# Patient Record
Sex: Male | Born: 1945 | Race: Black or African American | Hispanic: No | Marital: Married | Smoking: Former smoker
Health system: Southern US, Community
[De-identification: ages and names within clinical notes are randomized; demographics above are authoritative.]

## PROBLEM LIST (undated history)

## (undated) DIAGNOSIS — C801 Malignant (primary) neoplasm, unspecified: Secondary | ICD-10-CM

## (undated) DIAGNOSIS — I10 Essential (primary) hypertension: Secondary | ICD-10-CM

## (undated) DIAGNOSIS — B1920 Unspecified viral hepatitis C without hepatic coma: Secondary | ICD-10-CM

---

## 1999-02-03 ENCOUNTER — Encounter: Admission: RE | Admit: 1999-02-03 | Discharge: 1999-02-06 | Payer: Self-pay | Admitting: Family Medicine

## 2001-01-17 ENCOUNTER — Encounter: Payer: Self-pay | Admitting: Gastroenterology

## 2001-01-17 ENCOUNTER — Encounter: Admission: RE | Admit: 2001-01-17 | Discharge: 2001-01-17 | Payer: Self-pay | Admitting: Gastroenterology

## 2006-07-16 ENCOUNTER — Emergency Department (HOSPITAL_COMMUNITY): Admission: EM | Admit: 2006-07-16 | Discharge: 2006-07-16 | Payer: Self-pay | Admitting: Emergency Medicine

## 2006-08-30 ENCOUNTER — Inpatient Hospital Stay (HOSPITAL_COMMUNITY): Admission: RE | Admit: 2006-08-30 | Discharge: 2006-08-31 | Payer: Self-pay | Admitting: Neurosurgery

## 2015-04-17 ENCOUNTER — Other Ambulatory Visit (HOSPITAL_COMMUNITY): Payer: Self-pay | Admitting: Nurse Practitioner

## 2015-04-17 DIAGNOSIS — B182 Chronic viral hepatitis C: Secondary | ICD-10-CM

## 2015-05-07 ENCOUNTER — Ambulatory Visit (HOSPITAL_COMMUNITY)
Admission: RE | Admit: 2015-05-07 | Discharge: 2015-05-07 | Disposition: A | Discharge status: Home / Self Care | Payer: BLUE CROSS/BLUE SHIELD | Source: Ambulatory Visit | Attending: Nurse Practitioner | Admitting: Nurse Practitioner

## 2015-05-07 DIAGNOSIS — N281 Cyst of kidney, acquired: Secondary | ICD-10-CM | POA: Diagnosis not present

## 2015-05-07 DIAGNOSIS — I10 Essential (primary) hypertension: Secondary | ICD-10-CM | POA: Insufficient documentation

## 2015-05-07 DIAGNOSIS — B182 Chronic viral hepatitis C: Secondary | ICD-10-CM | POA: Diagnosis present

## 2015-05-07 DIAGNOSIS — K7689 Other specified diseases of liver: Secondary | ICD-10-CM | POA: Insufficient documentation

## 2016-03-26 ENCOUNTER — Other Ambulatory Visit: Payer: Self-pay | Admitting: Nurse Practitioner

## 2016-03-26 DIAGNOSIS — K7469 Other cirrhosis of liver: Secondary | ICD-10-CM

## 2016-04-02 ENCOUNTER — Ambulatory Visit
Admission: RE | Admit: 2016-04-02 | Discharge: 2016-04-02 | Disposition: A | Discharge status: Home / Self Care | Payer: BLUE CROSS/BLUE SHIELD | Source: Ambulatory Visit | Attending: Nurse Practitioner | Admitting: Nurse Practitioner

## 2016-04-02 DIAGNOSIS — K7469 Other cirrhosis of liver: Secondary | ICD-10-CM

## 2016-09-15 ENCOUNTER — Other Ambulatory Visit: Payer: Self-pay | Admitting: Nurse Practitioner

## 2016-09-15 DIAGNOSIS — K7469 Other cirrhosis of liver: Secondary | ICD-10-CM

## 2016-09-23 ENCOUNTER — Ambulatory Visit
Admission: RE | Admit: 2016-09-23 | Discharge: 2016-09-23 | Disposition: A | Discharge status: Home / Self Care | Payer: BLUE CROSS/BLUE SHIELD | Source: Ambulatory Visit | Attending: Nurse Practitioner | Admitting: Nurse Practitioner

## 2016-09-23 DIAGNOSIS — K7469 Other cirrhosis of liver: Secondary | ICD-10-CM

## 2017-03-15 DIAGNOSIS — K7469 Other cirrhosis of liver: Secondary | ICD-10-CM | POA: Diagnosis not present

## 2017-03-15 DIAGNOSIS — K740 Hepatic fibrosis: Secondary | ICD-10-CM | POA: Diagnosis not present

## 2017-03-16 ENCOUNTER — Other Ambulatory Visit: Payer: Self-pay | Admitting: Nurse Practitioner

## 2017-03-16 DIAGNOSIS — K7469 Other cirrhosis of liver: Secondary | ICD-10-CM

## 2017-04-07 ENCOUNTER — Ambulatory Visit
Admission: RE | Admit: 2017-04-07 | Discharge: 2017-04-07 | Disposition: A | Discharge status: Home / Self Care | Payer: BLUE CROSS/BLUE SHIELD | Source: Ambulatory Visit | Attending: Nurse Practitioner | Admitting: Nurse Practitioner

## 2017-04-07 DIAGNOSIS — K7460 Unspecified cirrhosis of liver: Secondary | ICD-10-CM | POA: Diagnosis not present

## 2017-04-07 DIAGNOSIS — K7469 Other cirrhosis of liver: Secondary | ICD-10-CM

## 2017-05-06 DIAGNOSIS — H401112 Primary open-angle glaucoma, right eye, moderate stage: Secondary | ICD-10-CM | POA: Diagnosis not present

## 2017-05-19 DIAGNOSIS — I10 Essential (primary) hypertension: Secondary | ICD-10-CM | POA: Diagnosis not present

## 2017-05-19 DIAGNOSIS — Z0000 Encounter for general adult medical examination without abnormal findings: Secondary | ICD-10-CM | POA: Diagnosis not present

## 2017-05-19 DIAGNOSIS — K5900 Constipation, unspecified: Secondary | ICD-10-CM | POA: Diagnosis not present

## 2017-05-19 DIAGNOSIS — Z23 Encounter for immunization: Secondary | ICD-10-CM | POA: Diagnosis not present

## 2017-05-19 DIAGNOSIS — N529 Male erectile dysfunction, unspecified: Secondary | ICD-10-CM | POA: Diagnosis not present

## 2017-05-19 DIAGNOSIS — Z1389 Encounter for screening for other disorder: Secondary | ICD-10-CM | POA: Diagnosis not present

## 2017-05-23 ENCOUNTER — Other Ambulatory Visit: Payer: Self-pay | Admitting: Family Medicine

## 2017-05-23 DIAGNOSIS — Z136 Encounter for screening for cardiovascular disorders: Secondary | ICD-10-CM

## 2017-06-10 ENCOUNTER — Ambulatory Visit: Payer: BLUE CROSS/BLUE SHIELD

## 2017-07-06 ENCOUNTER — Ambulatory Visit
Admission: RE | Admit: 2017-07-06 | Discharge: 2017-07-06 | Disposition: A | Discharge status: Home / Self Care | Payer: BLUE CROSS/BLUE SHIELD | Source: Ambulatory Visit | Attending: Family Medicine | Admitting: Family Medicine

## 2017-07-06 DIAGNOSIS — Z87891 Personal history of nicotine dependence: Secondary | ICD-10-CM | POA: Diagnosis not present

## 2017-07-06 DIAGNOSIS — Z136 Encounter for screening for cardiovascular disorders: Secondary | ICD-10-CM

## 2017-08-23 ENCOUNTER — Other Ambulatory Visit: Payer: Self-pay | Admitting: Nurse Practitioner

## 2017-08-23 DIAGNOSIS — K7469 Other cirrhosis of liver: Secondary | ICD-10-CM

## 2017-08-23 DIAGNOSIS — K740 Hepatic fibrosis: Secondary | ICD-10-CM | POA: Diagnosis not present

## 2017-09-08 ENCOUNTER — Ambulatory Visit
Admission: RE | Admit: 2017-09-08 | Discharge: 2017-09-08 | Disposition: A | Discharge status: Home / Self Care | Payer: BLUE CROSS/BLUE SHIELD | Source: Ambulatory Visit | Attending: Nurse Practitioner | Admitting: Nurse Practitioner

## 2017-09-08 DIAGNOSIS — K7460 Unspecified cirrhosis of liver: Secondary | ICD-10-CM | POA: Diagnosis not present

## 2017-09-08 DIAGNOSIS — K7469 Other cirrhosis of liver: Secondary | ICD-10-CM

## 2017-12-12 DIAGNOSIS — H401112 Primary open-angle glaucoma, right eye, moderate stage: Secondary | ICD-10-CM | POA: Diagnosis not present

## 2018-03-15 DIAGNOSIS — M79671 Pain in right foot: Secondary | ICD-10-CM | POA: Diagnosis not present

## 2018-03-15 DIAGNOSIS — M25774 Osteophyte, right foot: Secondary | ICD-10-CM | POA: Diagnosis not present

## 2018-03-15 DIAGNOSIS — L03031 Cellulitis of right toe: Secondary | ICD-10-CM | POA: Diagnosis not present

## 2018-03-22 DIAGNOSIS — K7469 Other cirrhosis of liver: Secondary | ICD-10-CM | POA: Diagnosis not present

## 2018-03-22 DIAGNOSIS — K740 Hepatic fibrosis: Secondary | ICD-10-CM | POA: Diagnosis not present

## 2018-03-27 ENCOUNTER — Other Ambulatory Visit: Payer: Self-pay | Admitting: Nurse Practitioner

## 2018-03-27 DIAGNOSIS — K7469 Other cirrhosis of liver: Secondary | ICD-10-CM

## 2018-03-29 DIAGNOSIS — M79675 Pain in left toe(s): Secondary | ICD-10-CM | POA: Diagnosis not present

## 2018-03-29 DIAGNOSIS — M79674 Pain in right toe(s): Secondary | ICD-10-CM | POA: Diagnosis not present

## 2018-04-05 ENCOUNTER — Ambulatory Visit
Admission: RE | Admit: 2018-04-05 | Discharge: 2018-04-05 | Disposition: A | Discharge status: Home / Self Care | Payer: BLUE CROSS/BLUE SHIELD | Source: Ambulatory Visit | Attending: Nurse Practitioner | Admitting: Nurse Practitioner

## 2018-04-05 DIAGNOSIS — K743 Primary biliary cirrhosis: Secondary | ICD-10-CM | POA: Diagnosis not present

## 2018-04-05 DIAGNOSIS — K7469 Other cirrhosis of liver: Secondary | ICD-10-CM

## 2018-04-13 ENCOUNTER — Other Ambulatory Visit: Payer: Self-pay | Admitting: Nurse Practitioner

## 2018-04-13 DIAGNOSIS — K7469 Other cirrhosis of liver: Secondary | ICD-10-CM

## 2018-04-13 DIAGNOSIS — K769 Liver disease, unspecified: Secondary | ICD-10-CM

## 2018-04-26 ENCOUNTER — Ambulatory Visit
Admission: RE | Admit: 2018-04-26 | Discharge: 2018-04-26 | Disposition: A | Discharge status: Home / Self Care | Payer: BLUE CROSS/BLUE SHIELD | Source: Ambulatory Visit | Attending: Nurse Practitioner | Admitting: Nurse Practitioner

## 2018-04-26 DIAGNOSIS — K7689 Other specified diseases of liver: Secondary | ICD-10-CM | POA: Diagnosis not present

## 2018-04-26 DIAGNOSIS — K769 Liver disease, unspecified: Secondary | ICD-10-CM

## 2018-04-26 DIAGNOSIS — K7469 Other cirrhosis of liver: Secondary | ICD-10-CM

## 2018-04-26 MED ORDER — GADOXETATE DISODIUM 0.25 MMOL/ML IV SOLN
9.0000 mL | Freq: Once | INTRAVENOUS | Status: AC | PRN
  Administered 2018-04-26: 9 mL via INTRAVENOUS

## 2018-05-03 DIAGNOSIS — Z23 Encounter for immunization: Secondary | ICD-10-CM | POA: Diagnosis not present

## 2018-05-09 DIAGNOSIS — H401112 Primary open-angle glaucoma, right eye, moderate stage: Secondary | ICD-10-CM | POA: Diagnosis not present

## 2018-05-25 DIAGNOSIS — N529 Male erectile dysfunction, unspecified: Secondary | ICD-10-CM | POA: Diagnosis not present

## 2018-05-25 DIAGNOSIS — Z0000 Encounter for general adult medical examination without abnormal findings: Secondary | ICD-10-CM | POA: Diagnosis not present

## 2018-05-25 DIAGNOSIS — I10 Essential (primary) hypertension: Secondary | ICD-10-CM | POA: Diagnosis not present

## 2018-05-25 DIAGNOSIS — K5900 Constipation, unspecified: Secondary | ICD-10-CM | POA: Diagnosis not present

## 2018-05-25 DIAGNOSIS — Z125 Encounter for screening for malignant neoplasm of prostate: Secondary | ICD-10-CM | POA: Diagnosis not present

## 2018-05-25 DIAGNOSIS — Z1389 Encounter for screening for other disorder: Secondary | ICD-10-CM | POA: Diagnosis not present

## 2018-07-15 IMAGING — US US ABDOMEN LIMITED
1 series · 14 of 25 positions shown · non-contrast
Comparison: Ultrasound 09/23/2016 .

CLINICAL DATA: Cirrhosis.

EXAM:
ULTRASOUND ABDOMEN LIMITED RIGHT UPPER QUADRANT

[Series 1: us abdomen limited · 0.20mm/px · 14 of 47 slices shown]
[im 1/47]
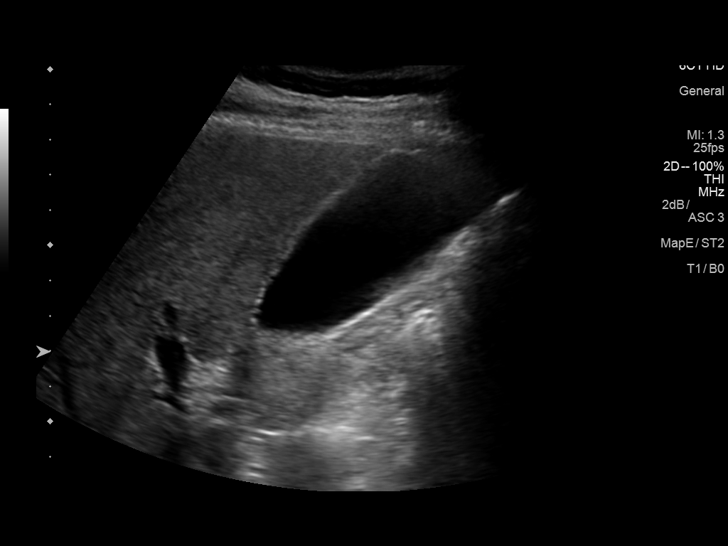
[im 4/47]
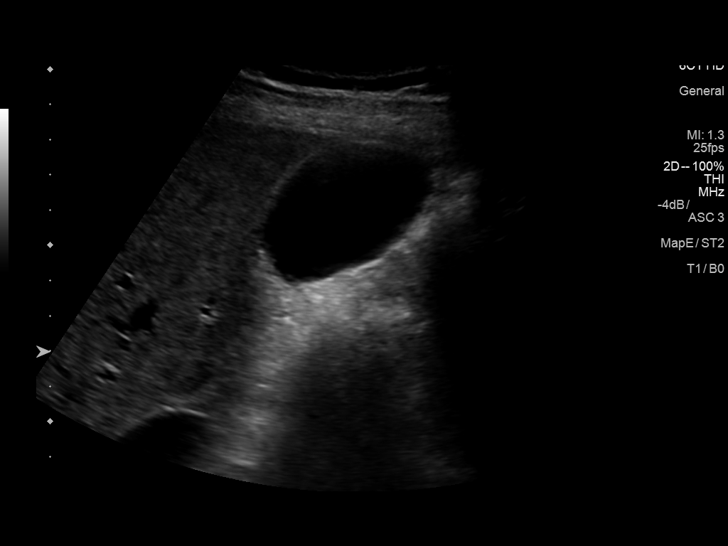
[im 8/47]
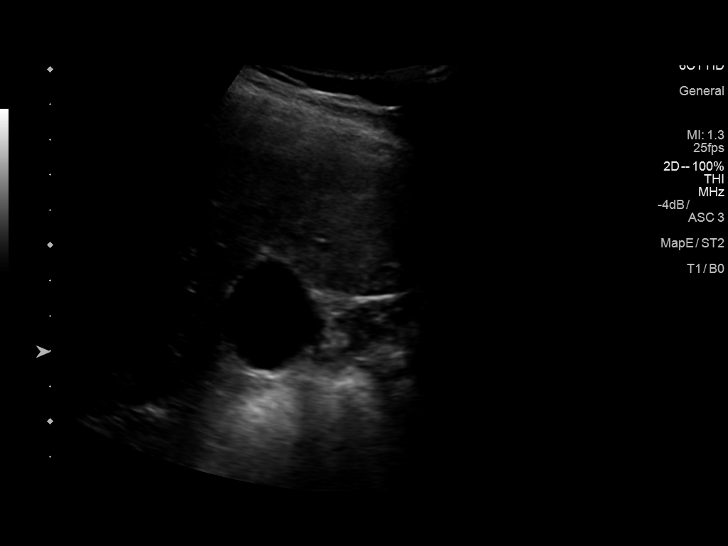
[im 12/47]
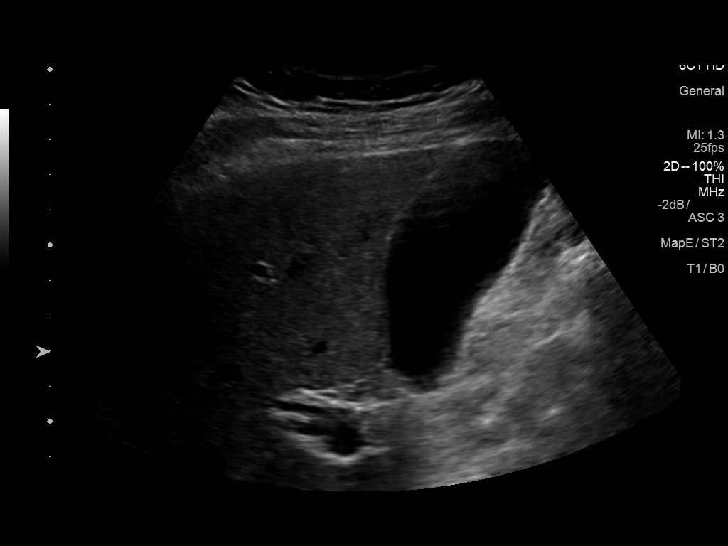
[im 16/47]
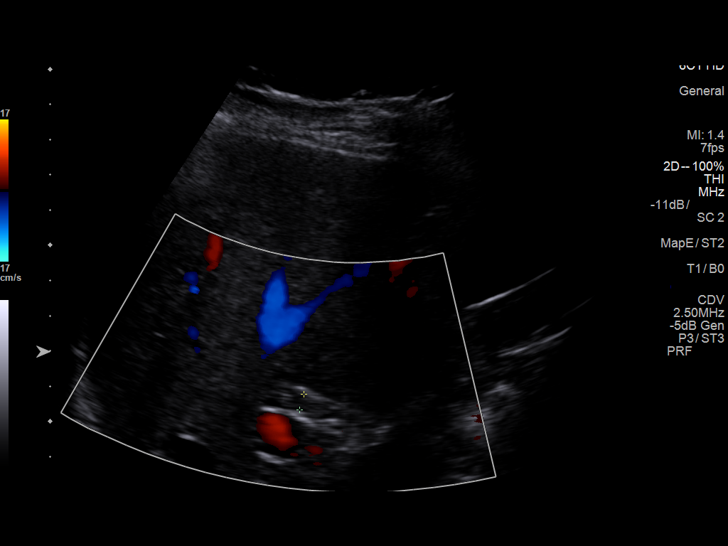
[im 18/47]
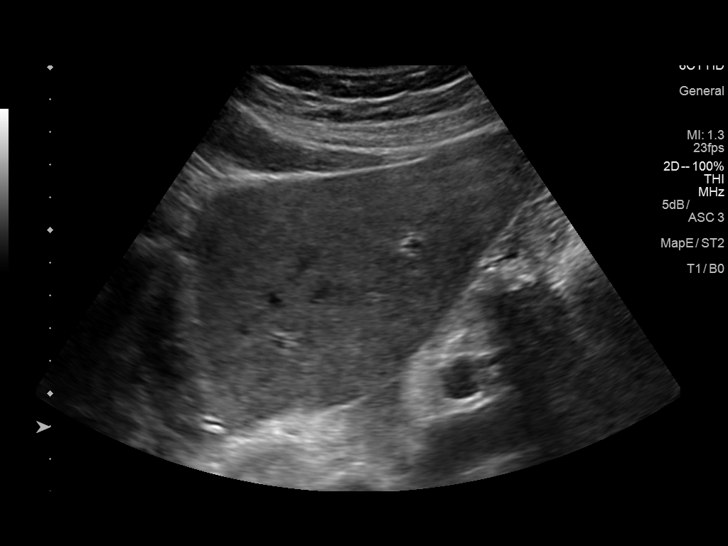
[im 22/47]
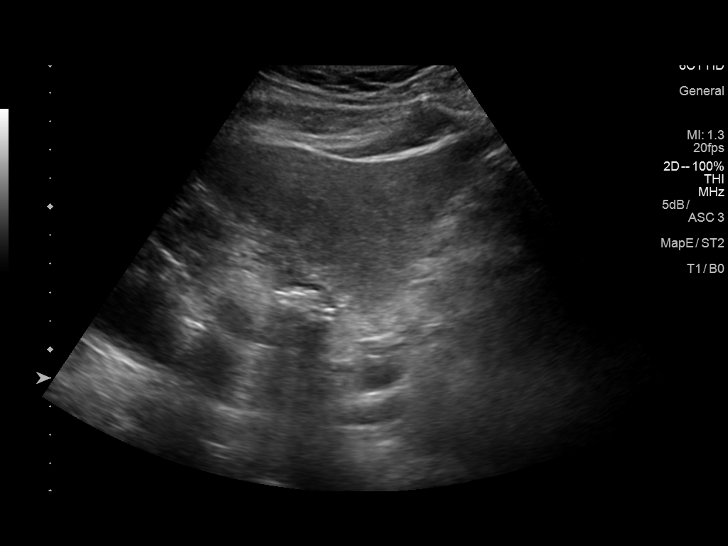
[im 25/47]
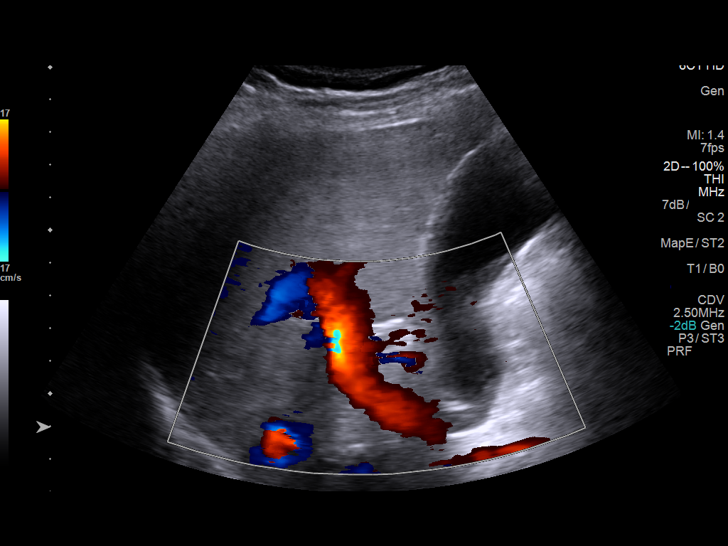
[im 29/47]
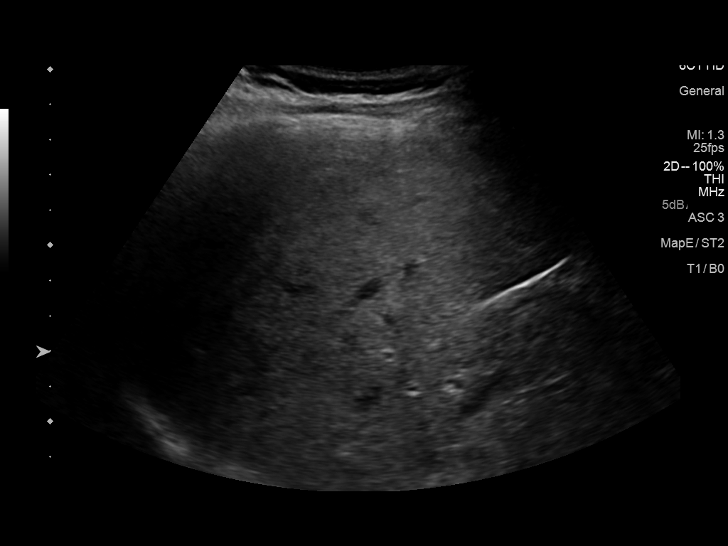
[im 31/47]
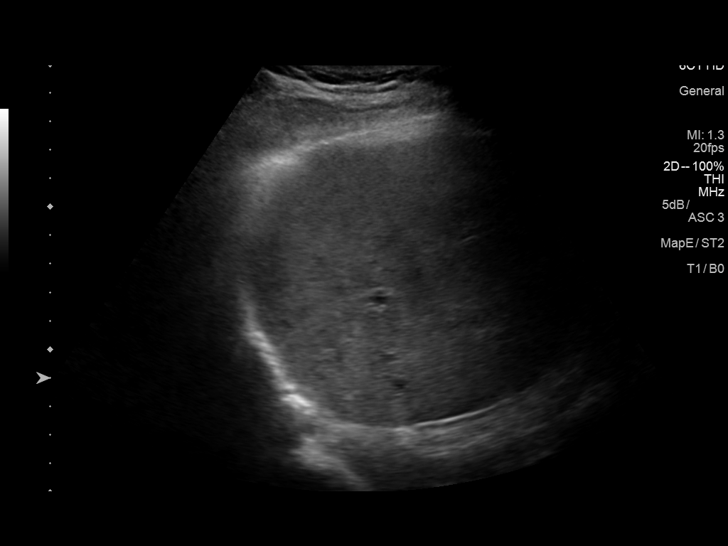
[im 35/47]
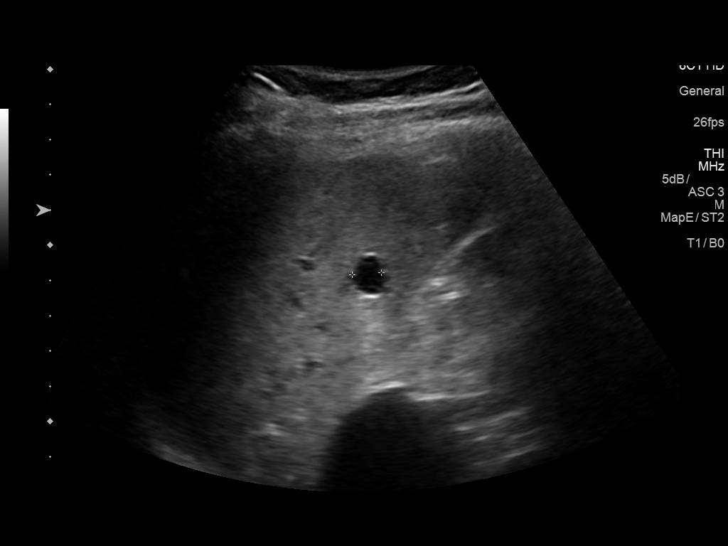
[im 39/47]
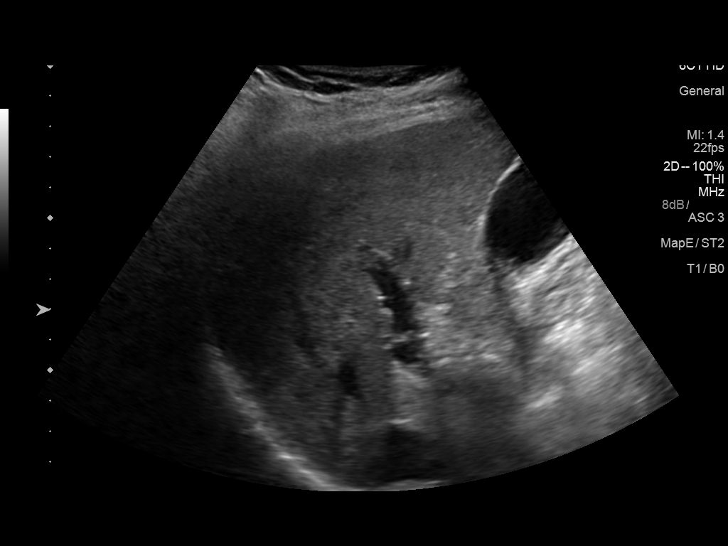
[im 43/47]
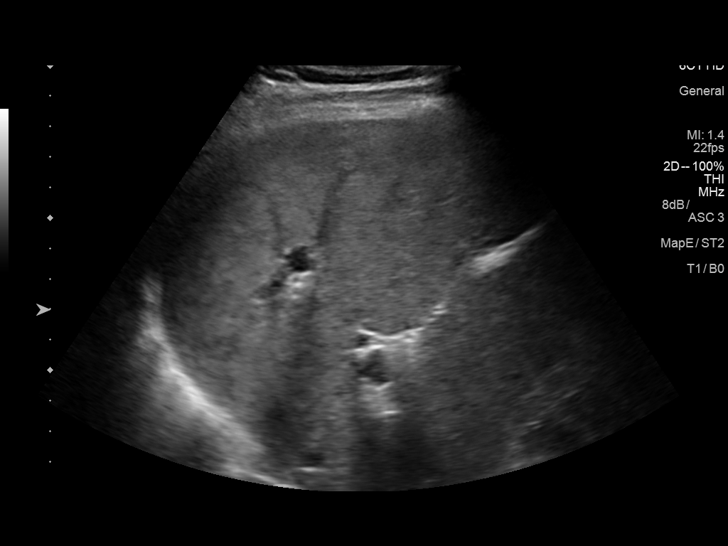
[im 47/47]
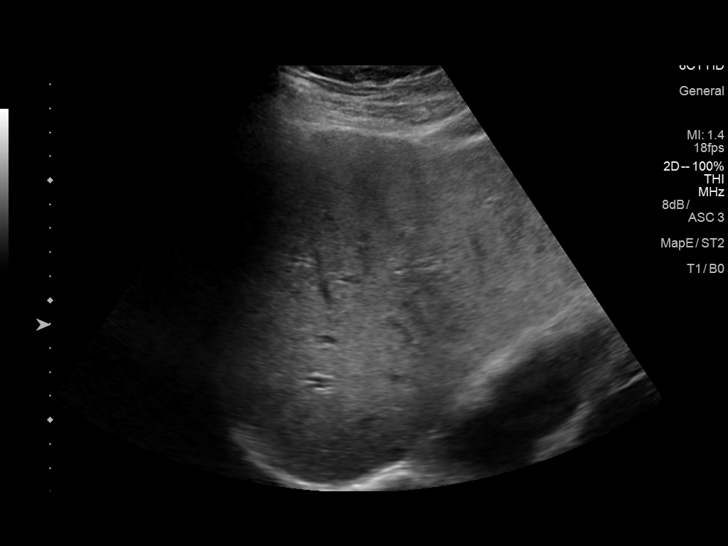

[14 of 25 positions shown; findings below may reference images not displayed]

FINDINGS: Gallbladder:

No gallstones or wall thickening visualized. No sonographic Murphy
sign noted by sonographer.

Common bile duct:

Diameter: 4.6 mm

Liver:

1.5 cm and 1.2 cm cysts right hepatic lobe. Slight hepatic contour
irregularity consistent patient's known cirrhosis. These may be
minimally septated and are consistent with benign cysts. Portal vein
is patent on color Doppler imaging with normal direction of blood
flow towards the liver.
IMPRESSION: 1. No gallstones or biliary distention.

2. Increased hepatic echogenicity consistent fatty infiltration
and/or hepatocellular disease. Slight hepatic contour irregularity
consistent the patient's known cirrhosis . Small benign cyst cysts
right hepatic lobe . No focal solid hepatic lesions noted .

## 2018-09-20 DIAGNOSIS — K740 Hepatic fibrosis: Secondary | ICD-10-CM | POA: Diagnosis not present

## 2018-09-21 ENCOUNTER — Other Ambulatory Visit: Payer: Self-pay | Admitting: Nurse Practitioner

## 2018-09-21 DIAGNOSIS — K7469 Other cirrhosis of liver: Secondary | ICD-10-CM

## 2018-10-25 ENCOUNTER — Other Ambulatory Visit: Payer: BLUE CROSS/BLUE SHIELD

## 2018-11-29 ENCOUNTER — Other Ambulatory Visit: Payer: BLUE CROSS/BLUE SHIELD

## 2018-12-06 ENCOUNTER — Ambulatory Visit
Admission: RE | Admit: 2018-12-06 | Discharge: 2018-12-06 | Disposition: A | Discharge status: Home / Self Care | Payer: BLUE CROSS/BLUE SHIELD | Source: Ambulatory Visit | Attending: Nurse Practitioner | Admitting: Nurse Practitioner

## 2018-12-06 DIAGNOSIS — K7469 Other cirrhosis of liver: Secondary | ICD-10-CM

## 2019-03-28 DIAGNOSIS — K740 Hepatic fibrosis: Secondary | ICD-10-CM | POA: Diagnosis not present

## 2019-04-02 ENCOUNTER — Other Ambulatory Visit: Payer: Self-pay | Admitting: Nurse Practitioner

## 2019-04-03 ENCOUNTER — Other Ambulatory Visit: Payer: Self-pay | Admitting: Nurse Practitioner

## 2019-04-03 DIAGNOSIS — K7469 Other cirrhosis of liver: Secondary | ICD-10-CM

## 2019-04-11 ENCOUNTER — Other Ambulatory Visit: Payer: BLUE CROSS/BLUE SHIELD

## 2019-04-17 DIAGNOSIS — K7469 Other cirrhosis of liver: Secondary | ICD-10-CM | POA: Diagnosis not present

## 2019-04-18 ENCOUNTER — Ambulatory Visit
Admission: RE | Admit: 2019-04-18 | Discharge: 2019-04-18 | Disposition: A | Discharge status: Home / Self Care | Payer: BC Managed Care – PPO | Source: Ambulatory Visit | Attending: Nurse Practitioner | Admitting: Nurse Practitioner

## 2019-04-18 DIAGNOSIS — K7469 Other cirrhosis of liver: Secondary | ICD-10-CM

## 2019-04-18 DIAGNOSIS — K7460 Unspecified cirrhosis of liver: Secondary | ICD-10-CM | POA: Diagnosis not present

## 2019-05-17 DIAGNOSIS — Z23 Encounter for immunization: Secondary | ICD-10-CM | POA: Diagnosis not present

## 2019-06-04 DIAGNOSIS — H401112 Primary open-angle glaucoma, right eye, moderate stage: Secondary | ICD-10-CM | POA: Diagnosis not present

## 2019-06-06 DIAGNOSIS — Z1389 Encounter for screening for other disorder: Secondary | ICD-10-CM | POA: Diagnosis not present

## 2019-06-06 DIAGNOSIS — Z125 Encounter for screening for malignant neoplasm of prostate: Secondary | ICD-10-CM | POA: Diagnosis not present

## 2019-06-06 DIAGNOSIS — I10 Essential (primary) hypertension: Secondary | ICD-10-CM | POA: Diagnosis not present

## 2019-06-06 DIAGNOSIS — Z13220 Encounter for screening for lipoid disorders: Secondary | ICD-10-CM | POA: Diagnosis not present

## 2019-06-06 DIAGNOSIS — Z0000 Encounter for general adult medical examination without abnormal findings: Secondary | ICD-10-CM | POA: Diagnosis not present

## 2019-06-20 DIAGNOSIS — H401123 Primary open-angle glaucoma, left eye, severe stage: Secondary | ICD-10-CM | POA: Diagnosis not present

## 2019-07-13 IMAGING — US US ABDOMEN LIMITED
1 series · 13 of 25 positions shown · non-contrast
Comparison: Previous studies 04/07/2017 and 09/08/2017.

CLINICAL DATA: [place] cirrhosis.

EXAM:
ULTRASOUND ABDOMEN LIMITED RIGHT UPPER QUADRANT

[Series 1: us abdomen limited · 0.20mm/px · 13 of 58 slices shown]
[im 1/58]
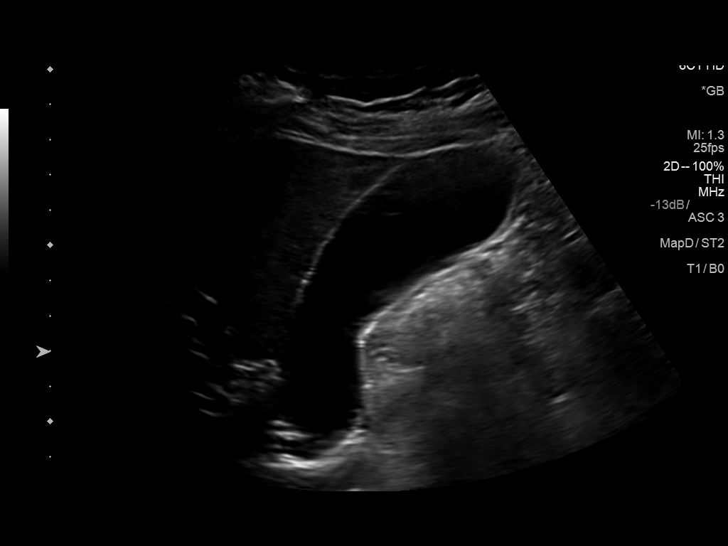
[im 5/58]
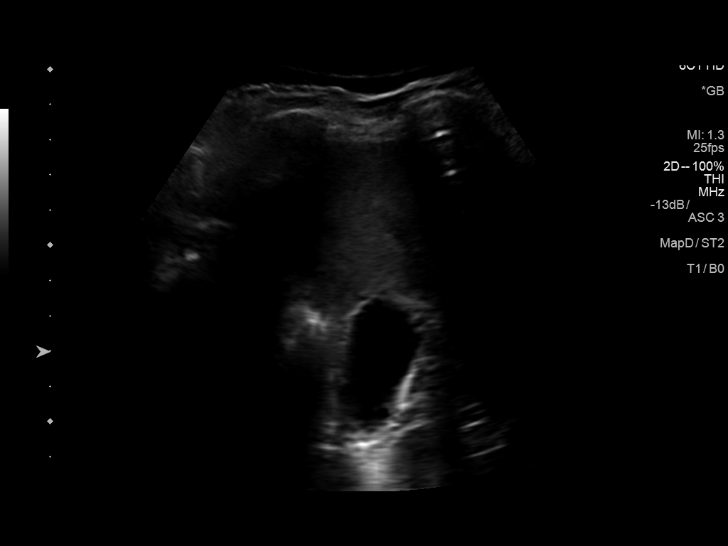
[im 10/58]
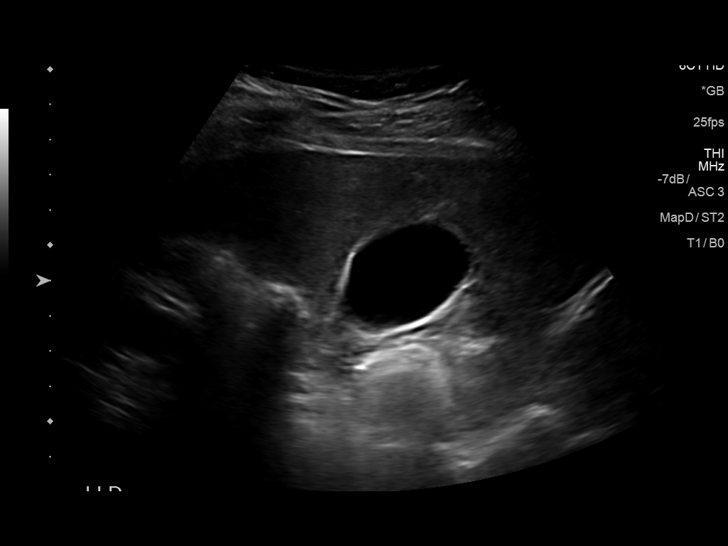
[im 15/58]
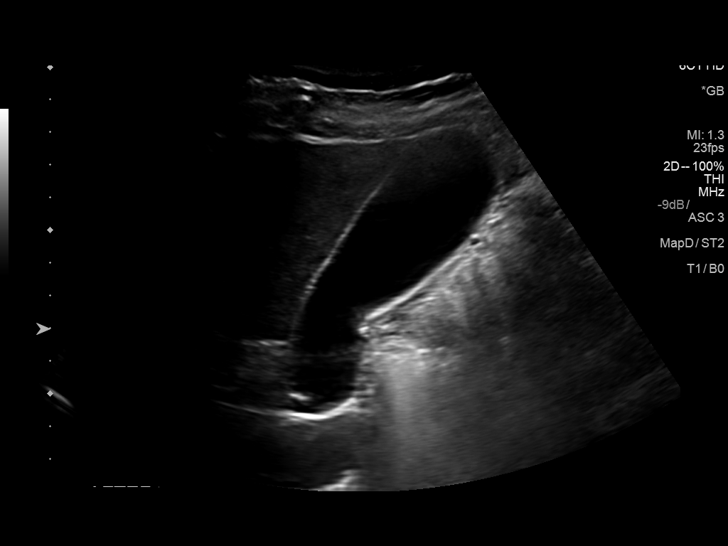
[im 20/58]
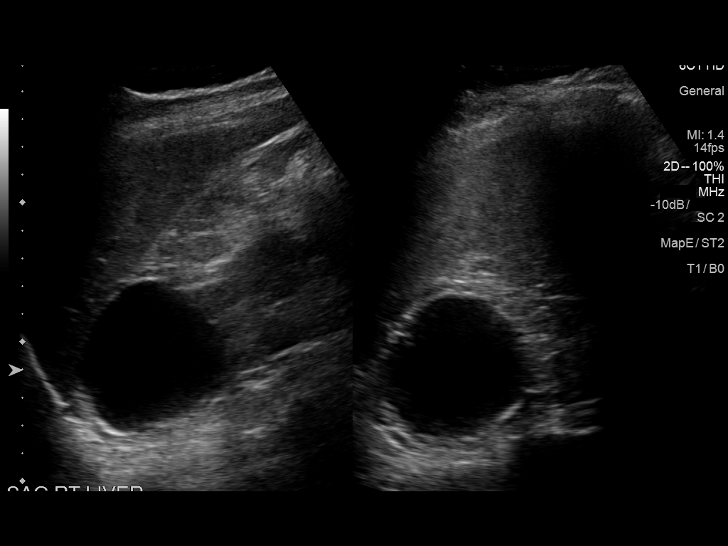
[im 24/58]
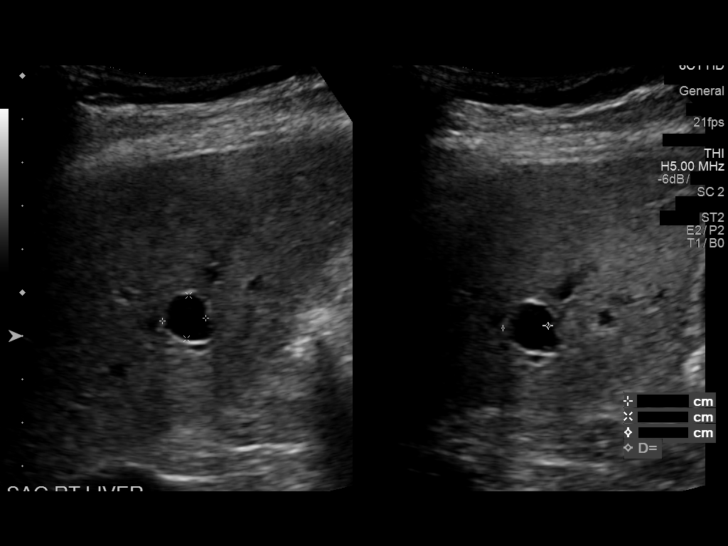
[im 29/58]
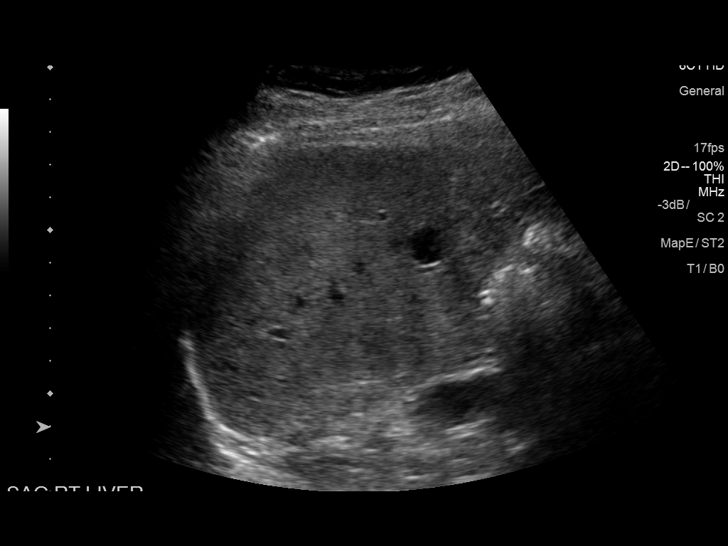
[im 34/58]
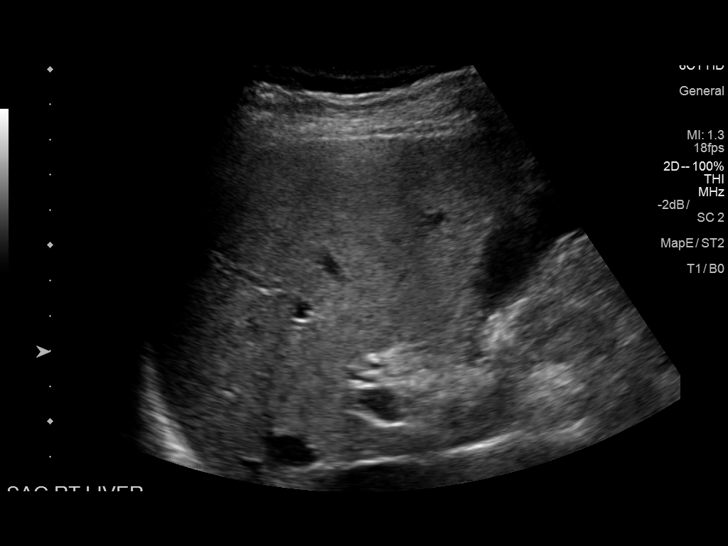
[im 39/58]
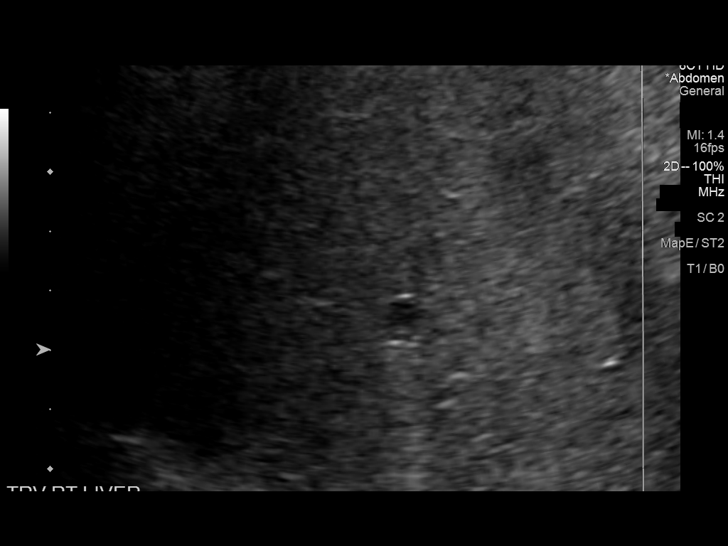
[im 43/58]
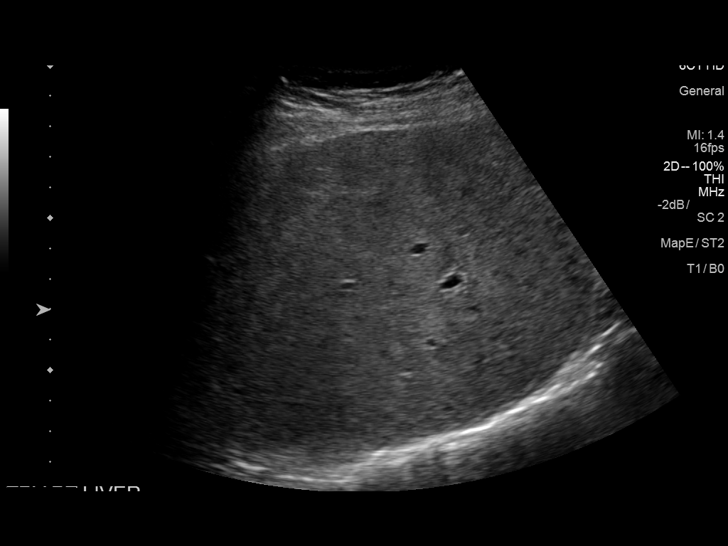
[im 48/58]
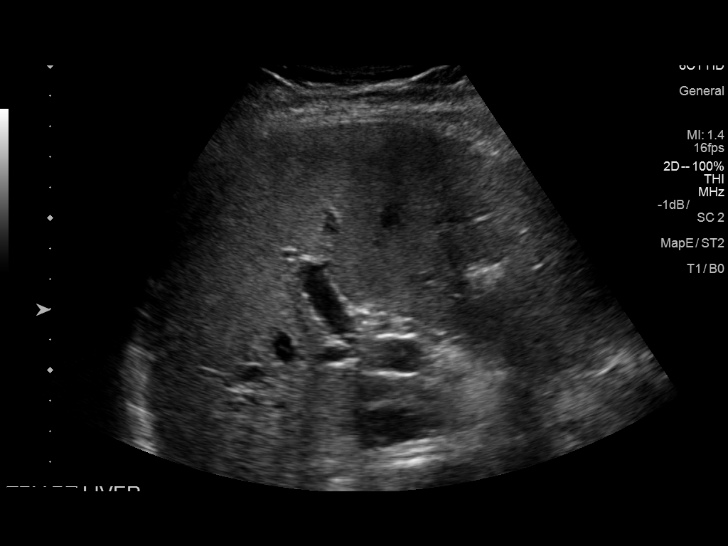
[im 53/58]
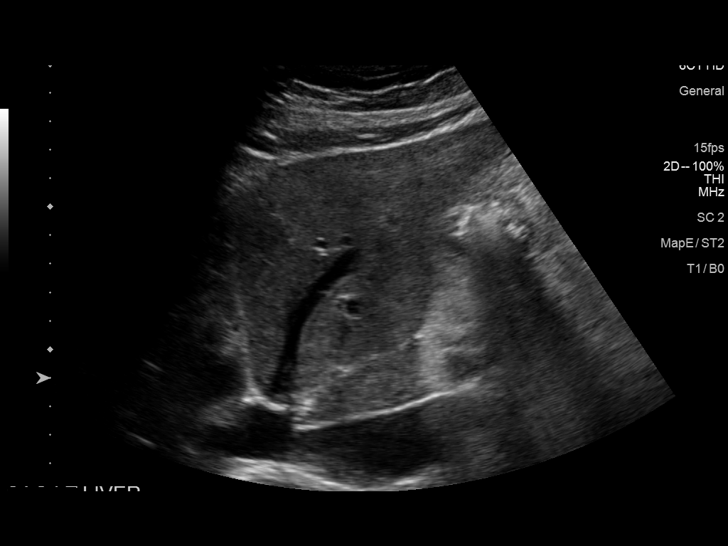
[im 58/58]
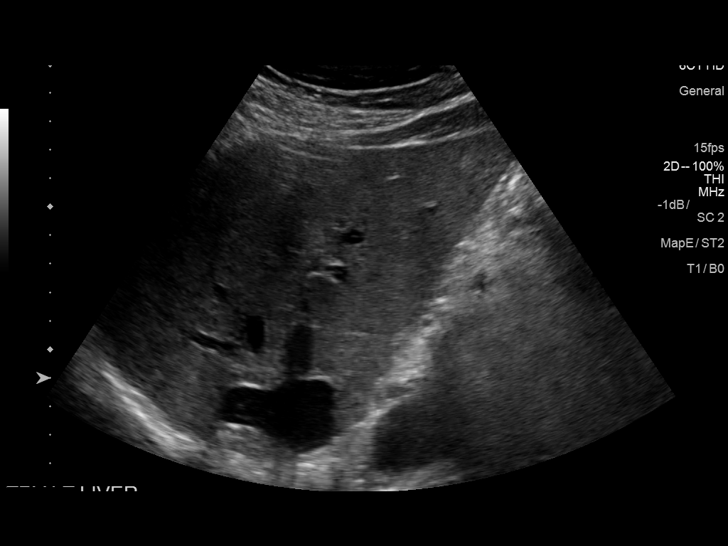

[13 of 25 positions shown; findings below may reference images not displayed]

FINDINGS: Gallbladder:

No gallstones or wall thickening visualized. No sonographic Murphy
sign noted by sonographer.

Common bile duct:

Diameter: 4 mm

Liver:

The hepatic parenchyma is diffusely heterogeneous. 2 simple cysts
are again noted within the right hepatic lobe, similar to the
previous study. These measure 10 x 10 x 11 mm and 6 x 5 x 6 mm.
There is a 3rd hypoechoic lesion measuring 9 x 8 x 9 mm which was
not clearly seen previously. This demonstrates internal echoes and
is not clearly cystic. Portal vein is patent on color Doppler
imaging with normal direction of blood flow towards the liver.

A right renal cyst is noted incidentally.
IMPRESSION: 1. Morphologic changes of cirrhosis are again noted.
2. Today study demonstrates a possible new indeterminate lesion in
the right hepatic lobe which is not clearly cystic. Given the
patient's history, further evaluation recommended to evaluate for
possible hepatocellular carcinoma. MRI without and with contrast is
the preferred examination.
3. These results will be called to the ordering clinician or
representative by the Radiologist Assistant, and communication
documented in the PACS or zVision Dashboard.

## 2019-10-17 DIAGNOSIS — K7402 Hepatic fibrosis, advanced fibrosis: Secondary | ICD-10-CM | POA: Diagnosis not present

## 2019-10-18 ENCOUNTER — Other Ambulatory Visit: Payer: Self-pay | Admitting: Nurse Practitioner

## 2019-10-18 DIAGNOSIS — K7469 Other cirrhosis of liver: Secondary | ICD-10-CM

## 2019-10-24 ENCOUNTER — Ambulatory Visit
Admission: RE | Admit: 2019-10-24 | Discharge: 2019-10-24 | Disposition: A | Discharge status: Home / Self Care | Payer: BC Managed Care – PPO | Source: Ambulatory Visit | Attending: Nurse Practitioner | Admitting: Nurse Practitioner

## 2019-10-24 DIAGNOSIS — N281 Cyst of kidney, acquired: Secondary | ICD-10-CM | POA: Diagnosis not present

## 2019-10-24 DIAGNOSIS — K7469 Other cirrhosis of liver: Secondary | ICD-10-CM

## 2019-10-24 DIAGNOSIS — K7689 Other specified diseases of liver: Secondary | ICD-10-CM | POA: Diagnosis not present

## 2019-11-13 DIAGNOSIS — H401112 Primary open-angle glaucoma, right eye, moderate stage: Secondary | ICD-10-CM | POA: Diagnosis not present

## 2020-03-20 DIAGNOSIS — H401112 Primary open-angle glaucoma, right eye, moderate stage: Secondary | ICD-10-CM | POA: Diagnosis not present

## 2020-04-13 IMAGING — US US ABDOMEN LIMITED
1 series · 14 of 25 positions shown · non-contrast
Comparison: 12/06/2018 abdominal sonogram.

CLINICAL DATA: [place] cirrhosis.

EXAM:
ULTRASOUND ABDOMEN LIMITED RIGHT UPPER QUADRANT

[Series 1: us abdomen limited · 0.22mm/px · 14 of 40 slices shown]
[im 1/40]
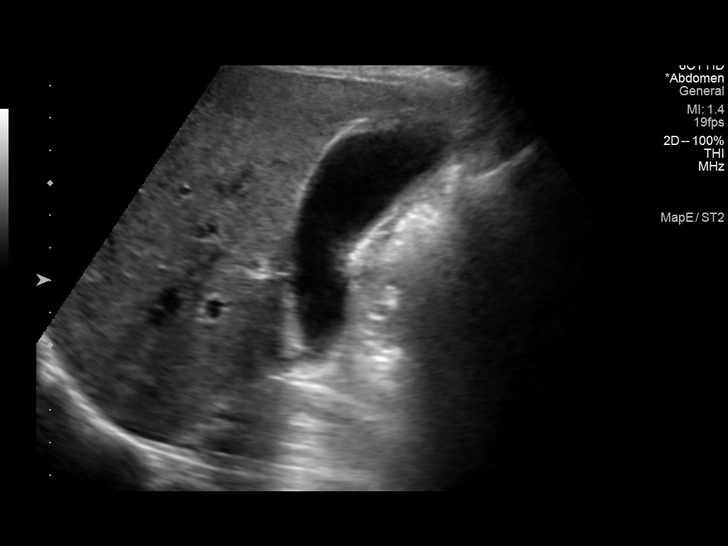
[im 4/40]
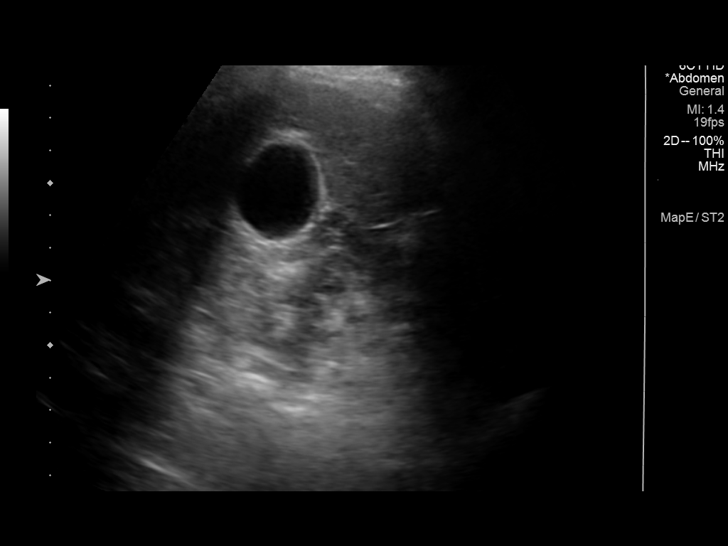
[im 7/40]
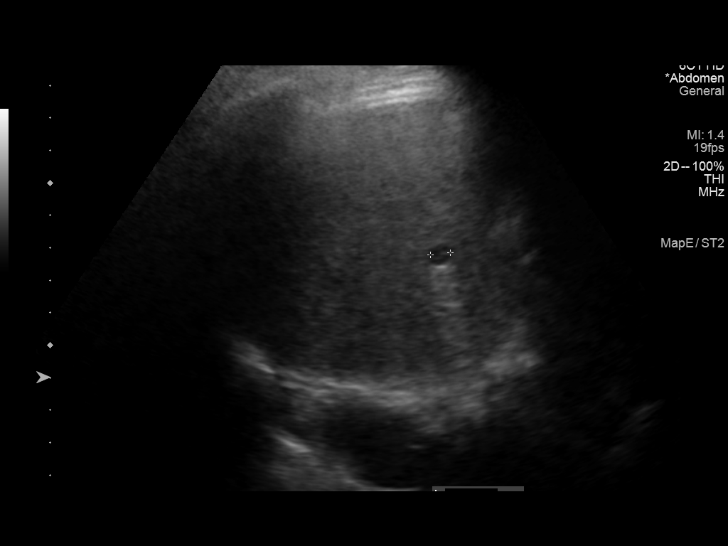
[im 10/40]
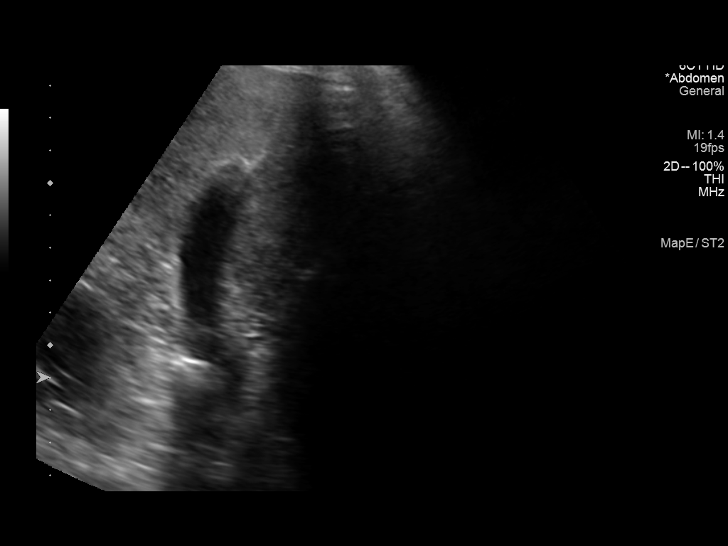
[im 14/40]
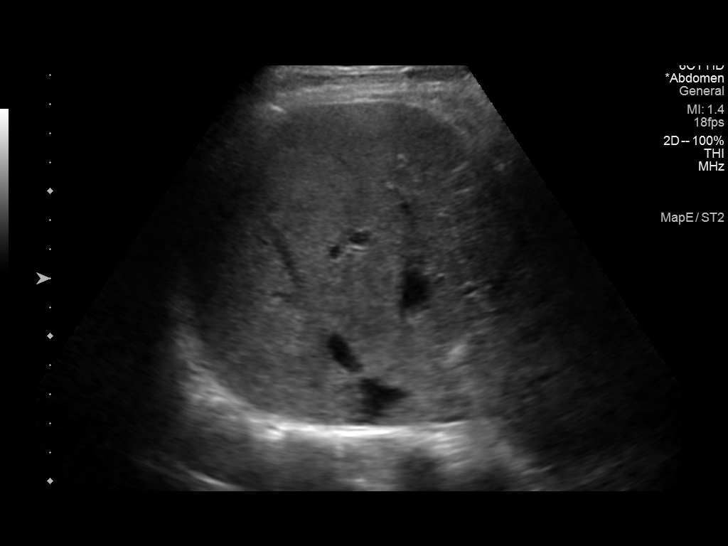
[im 15/40]
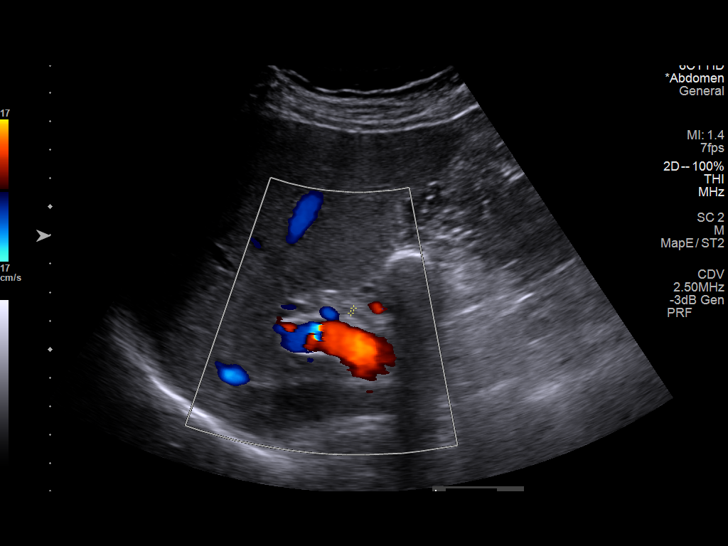
[im 18/40]
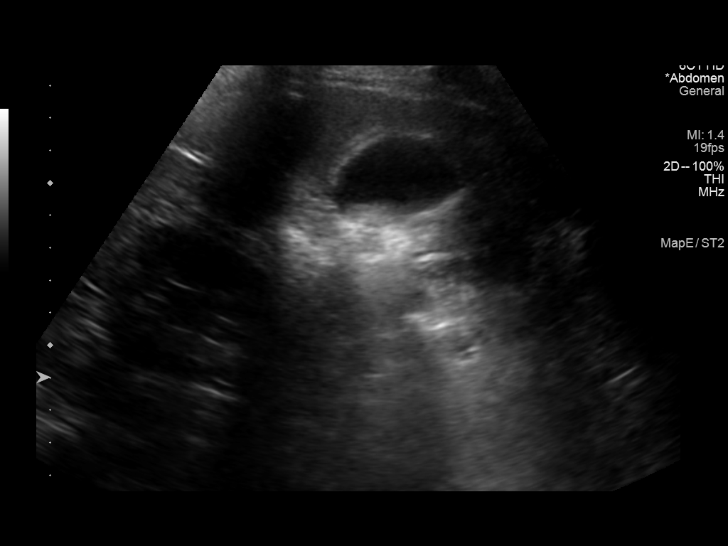
[im 22/40]
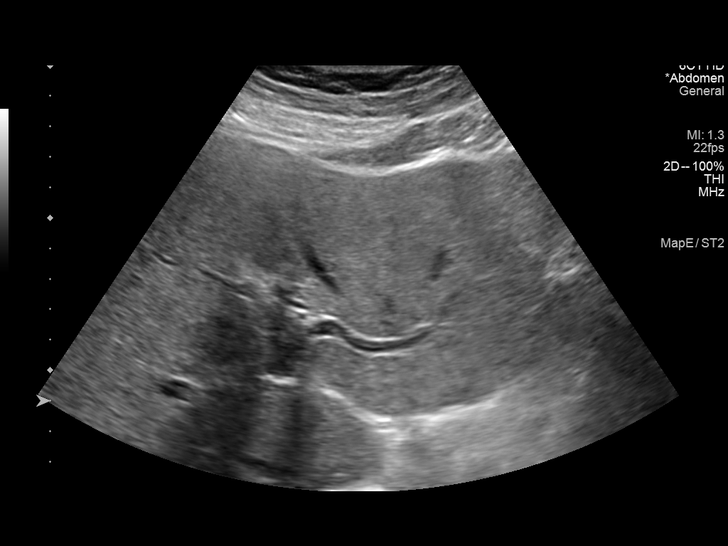
[im 25/40]
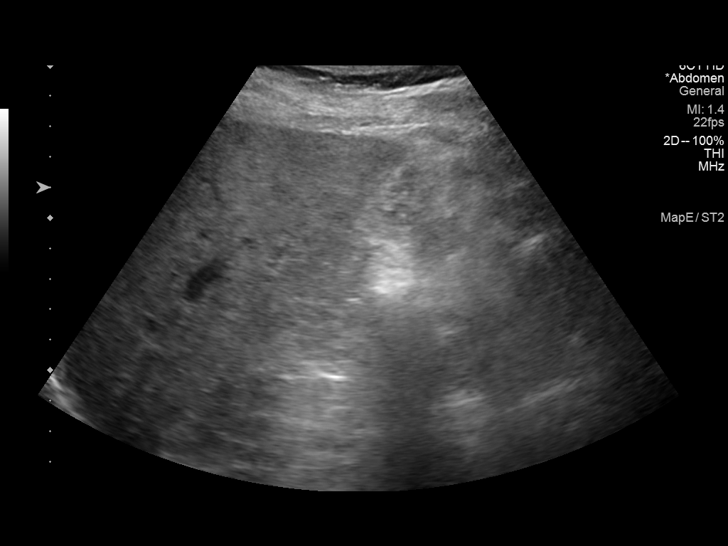
[im 27/40]
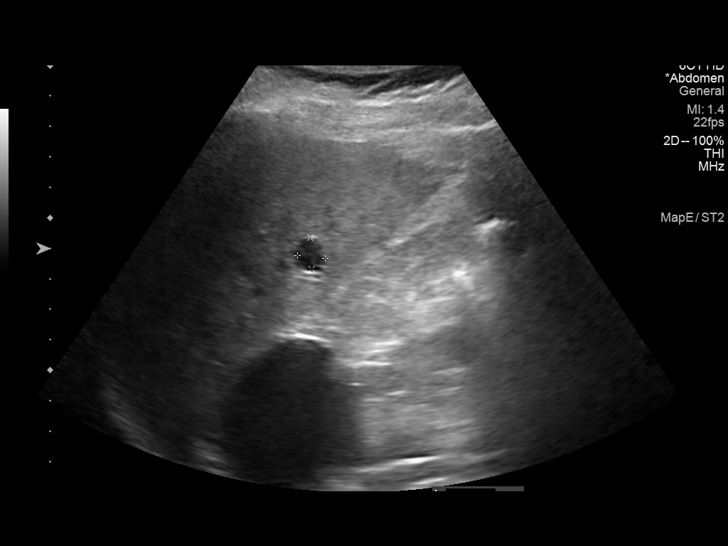
[im 30/40]
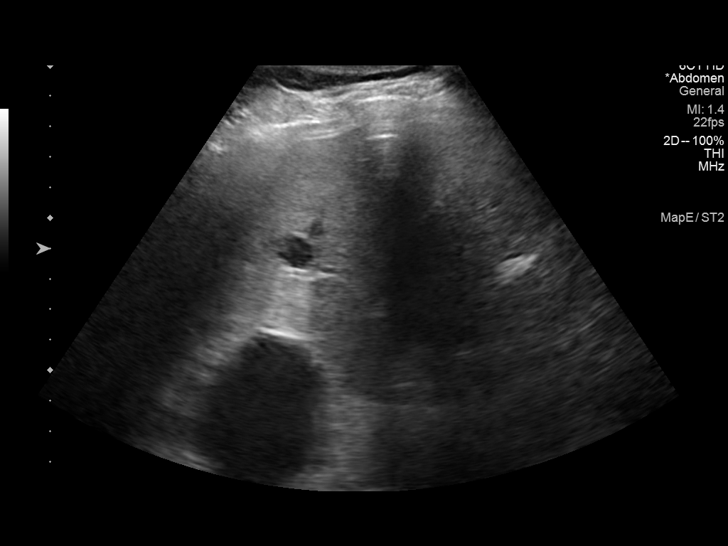
[im 33/40]
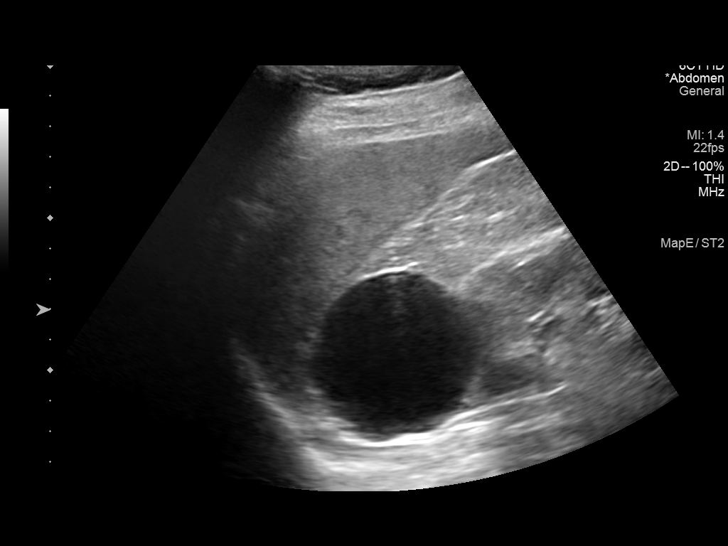
[im 36/40]
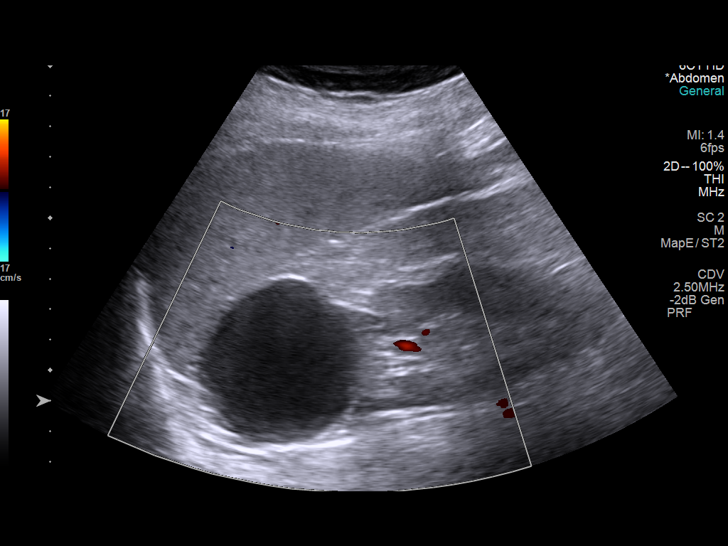
[im 40/40]
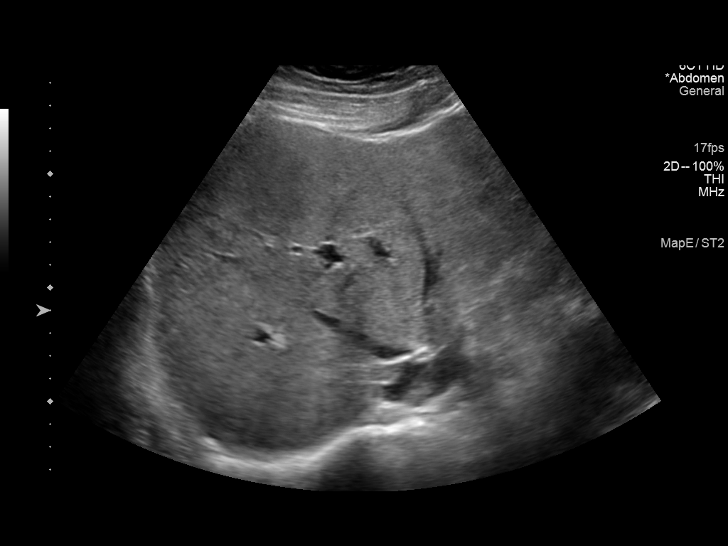

[14 of 25 positions shown; findings below may reference images not displayed]

FINDINGS: Gallbladder:

No gallstones or wall thickening visualized. No sonographic Murphy
sign noted by sonographer.

Common bile duct:

Diameter: 2 mm

Liver:

Simple 1.0 x 0.9 x 0.9 cm right liver cyst. Separate
benign-appearing 0.6 x 0.5 x 0.6 cm right liver cyst. No additional
liver lesions. Normal liver parenchymal echogenicity. Mildly
coarsened liver parenchymal echotexture. Portal vein is patent on
color Doppler imaging with normal direction of blood flow towards
the liver.

Other: Incidentally noted simple 5.6 cm upper right renal cyst.
IMPRESSION: 1. No suspicious liver masses. Small benign-appearing right liver
cysts are unchanged.
2. Mildly coarsened liver parenchymal echotexture, nonspecific,
cannot exclude hepatic fibrosis.

## 2020-04-23 DIAGNOSIS — Z7185 Encounter for immunization safety counseling: Secondary | ICD-10-CM | POA: Diagnosis not present

## 2020-04-23 DIAGNOSIS — K7402 Hepatic fibrosis, advanced fibrosis: Secondary | ICD-10-CM | POA: Diagnosis not present

## 2020-04-23 DIAGNOSIS — K4090 Unilateral inguinal hernia, without obstruction or gangrene, not specified as recurrent: Secondary | ICD-10-CM | POA: Diagnosis not present

## 2020-04-24 ENCOUNTER — Other Ambulatory Visit: Payer: Self-pay | Admitting: Nurse Practitioner

## 2020-04-24 DIAGNOSIS — K7402 Hepatic fibrosis, advanced fibrosis: Secondary | ICD-10-CM

## 2020-04-25 DIAGNOSIS — K7469 Other cirrhosis of liver: Secondary | ICD-10-CM | POA: Diagnosis not present

## 2020-04-28 DIAGNOSIS — Z23 Encounter for immunization: Secondary | ICD-10-CM | POA: Diagnosis not present

## 2020-04-28 DIAGNOSIS — K4090 Unilateral inguinal hernia, without obstruction or gangrene, not specified as recurrent: Secondary | ICD-10-CM | POA: Diagnosis not present

## 2020-05-01 ENCOUNTER — Ambulatory Visit
Admission: RE | Admit: 2020-05-01 | Discharge: 2020-05-01 | Disposition: A | Discharge status: Home / Self Care | Payer: BC Managed Care – PPO | Source: Ambulatory Visit | Attending: Nurse Practitioner | Admitting: Nurse Practitioner

## 2020-05-01 DIAGNOSIS — K760 Fatty (change of) liver, not elsewhere classified: Secondary | ICD-10-CM | POA: Diagnosis not present

## 2020-05-01 DIAGNOSIS — K7402 Hepatic fibrosis, advanced fibrosis: Secondary | ICD-10-CM

## 2020-06-02 DIAGNOSIS — K4090 Unilateral inguinal hernia, without obstruction or gangrene, not specified as recurrent: Secondary | ICD-10-CM | POA: Diagnosis not present

## 2020-06-03 DIAGNOSIS — L2084 Intrinsic (allergic) eczema: Secondary | ICD-10-CM | POA: Diagnosis not present

## 2020-06-12 DIAGNOSIS — Z1389 Encounter for screening for other disorder: Secondary | ICD-10-CM | POA: Diagnosis not present

## 2020-06-12 DIAGNOSIS — I10 Essential (primary) hypertension: Secondary | ICD-10-CM | POA: Diagnosis not present

## 2020-06-12 DIAGNOSIS — N529 Male erectile dysfunction, unspecified: Secondary | ICD-10-CM | POA: Diagnosis not present

## 2020-06-12 DIAGNOSIS — R7309 Other abnormal glucose: Secondary | ICD-10-CM | POA: Diagnosis not present

## 2020-06-12 DIAGNOSIS — Z0000 Encounter for general adult medical examination without abnormal findings: Secondary | ICD-10-CM | POA: Diagnosis not present

## 2020-06-12 DIAGNOSIS — Z125 Encounter for screening for malignant neoplasm of prostate: Secondary | ICD-10-CM | POA: Diagnosis not present

## 2020-06-12 DIAGNOSIS — Z23 Encounter for immunization: Secondary | ICD-10-CM | POA: Diagnosis not present

## 2020-09-01 DIAGNOSIS — H401123 Primary open-angle glaucoma, left eye, severe stage: Secondary | ICD-10-CM | POA: Diagnosis not present

## 2020-10-22 ENCOUNTER — Other Ambulatory Visit: Payer: Self-pay | Admitting: Nurse Practitioner

## 2020-10-22 DIAGNOSIS — K7402 Hepatic fibrosis, advanced fibrosis: Secondary | ICD-10-CM | POA: Diagnosis not present

## 2020-10-22 DIAGNOSIS — Z8619 Personal history of other infectious and parasitic diseases: Secondary | ICD-10-CM | POA: Diagnosis not present

## 2020-10-31 ENCOUNTER — Ambulatory Visit
Admission: RE | Admit: 2020-10-31 | Discharge: 2020-10-31 | Disposition: A | Discharge status: Home / Self Care | Payer: BC Managed Care – PPO | Source: Ambulatory Visit | Attending: Nurse Practitioner | Admitting: Nurse Practitioner

## 2020-10-31 DIAGNOSIS — K760 Fatty (change of) liver, not elsewhere classified: Secondary | ICD-10-CM | POA: Diagnosis not present

## 2020-10-31 DIAGNOSIS — K7689 Other specified diseases of liver: Secondary | ICD-10-CM | POA: Diagnosis not present

## 2020-10-31 DIAGNOSIS — K7402 Hepatic fibrosis, advanced fibrosis: Secondary | ICD-10-CM

## 2021-04-03 DIAGNOSIS — H401112 Primary open-angle glaucoma, right eye, moderate stage: Secondary | ICD-10-CM | POA: Diagnosis not present

## 2021-04-24 DIAGNOSIS — K760 Fatty (change of) liver, not elsewhere classified: Secondary | ICD-10-CM | POA: Diagnosis not present

## 2021-04-24 DIAGNOSIS — K7402 Hepatic fibrosis, advanced fibrosis: Secondary | ICD-10-CM | POA: Diagnosis not present

## 2021-04-28 ENCOUNTER — Other Ambulatory Visit: Payer: Self-pay | Admitting: Nurse Practitioner

## 2021-04-28 DIAGNOSIS — K7402 Hepatic fibrosis, advanced fibrosis: Secondary | ICD-10-CM

## 2021-05-01 ENCOUNTER — Ambulatory Visit
Admission: RE | Admit: 2021-05-01 | Discharge: 2021-05-01 | Disposition: A | Discharge status: Home / Self Care | Payer: BC Managed Care – PPO | Source: Ambulatory Visit | Attending: Nurse Practitioner | Admitting: Nurse Practitioner

## 2021-05-01 DIAGNOSIS — K760 Fatty (change of) liver, not elsewhere classified: Secondary | ICD-10-CM | POA: Diagnosis not present

## 2021-05-01 DIAGNOSIS — K7402 Hepatic fibrosis, advanced fibrosis: Secondary | ICD-10-CM

## 2021-05-20 DIAGNOSIS — Z23 Encounter for immunization: Secondary | ICD-10-CM | POA: Diagnosis not present

## 2021-06-01 DIAGNOSIS — L2084 Intrinsic (allergic) eczema: Secondary | ICD-10-CM | POA: Diagnosis not present

## 2021-06-11 DIAGNOSIS — Z20822 Contact with and (suspected) exposure to covid-19: Secondary | ICD-10-CM | POA: Diagnosis not present

## 2021-06-15 DIAGNOSIS — U071 COVID-19: Secondary | ICD-10-CM | POA: Diagnosis not present

## 2021-07-01 ENCOUNTER — Other Ambulatory Visit: Payer: Self-pay | Admitting: Physician Assistant

## 2021-07-01 ENCOUNTER — Ambulatory Visit
Admission: RE | Admit: 2021-07-01 | Discharge: 2021-07-01 | Disposition: A | Discharge status: Home / Self Care | Payer: BC Managed Care – PPO | Source: Ambulatory Visit | Attending: Family Medicine | Admitting: Family Medicine

## 2021-07-01 ENCOUNTER — Other Ambulatory Visit: Payer: Self-pay | Admitting: Family Medicine

## 2021-07-01 DIAGNOSIS — U099 Post covid-19 condition, unspecified: Secondary | ICD-10-CM | POA: Diagnosis not present

## 2021-07-01 DIAGNOSIS — I10 Essential (primary) hypertension: Secondary | ICD-10-CM | POA: Diagnosis not present

## 2021-07-01 DIAGNOSIS — Z0000 Encounter for general adult medical examination without abnormal findings: Secondary | ICD-10-CM | POA: Diagnosis not present

## 2021-07-01 DIAGNOSIS — R059 Cough, unspecified: Secondary | ICD-10-CM | POA: Diagnosis not present

## 2021-07-01 DIAGNOSIS — Z13220 Encounter for screening for lipoid disorders: Secondary | ICD-10-CM | POA: Diagnosis not present

## 2021-07-01 DIAGNOSIS — N528 Other male erectile dysfunction: Secondary | ICD-10-CM | POA: Diagnosis not present

## 2021-07-01 DIAGNOSIS — Z23 Encounter for immunization: Secondary | ICD-10-CM | POA: Diagnosis not present

## 2021-07-01 DIAGNOSIS — R053 Chronic cough: Secondary | ICD-10-CM | POA: Diagnosis not present

## 2021-07-01 DIAGNOSIS — Z125 Encounter for screening for malignant neoplasm of prostate: Secondary | ICD-10-CM | POA: Diagnosis not present

## 2021-07-08 DIAGNOSIS — D649 Anemia, unspecified: Secondary | ICD-10-CM | POA: Diagnosis not present

## 2021-07-17 ENCOUNTER — Other Ambulatory Visit: Payer: Self-pay | Admitting: Family Medicine

## 2021-07-17 DIAGNOSIS — R9389 Abnormal findings on diagnostic imaging of other specified body structures: Secondary | ICD-10-CM

## 2021-07-17 DIAGNOSIS — R053 Chronic cough: Secondary | ICD-10-CM

## 2021-07-17 DIAGNOSIS — Z87891 Personal history of nicotine dependence: Secondary | ICD-10-CM

## 2021-08-10 ENCOUNTER — Other Ambulatory Visit: Payer: Self-pay

## 2021-08-10 ENCOUNTER — Ambulatory Visit
Admission: RE | Admit: 2021-08-10 | Discharge: 2021-08-10 | Disposition: A | Discharge status: Home / Self Care | Payer: BC Managed Care – PPO | Source: Ambulatory Visit | Attending: Family Medicine | Admitting: Family Medicine

## 2021-08-10 DIAGNOSIS — R053 Chronic cough: Secondary | ICD-10-CM

## 2021-08-10 DIAGNOSIS — Z87891 Personal history of nicotine dependence: Secondary | ICD-10-CM

## 2021-08-10 DIAGNOSIS — R911 Solitary pulmonary nodule: Secondary | ICD-10-CM | POA: Diagnosis not present

## 2021-08-10 DIAGNOSIS — R9389 Abnormal findings on diagnostic imaging of other specified body structures: Secondary | ICD-10-CM

## 2021-08-10 DIAGNOSIS — R918 Other nonspecific abnormal finding of lung field: Secondary | ICD-10-CM | POA: Diagnosis not present

## 2021-09-14 DIAGNOSIS — H401112 Primary open-angle glaucoma, right eye, moderate stage: Secondary | ICD-10-CM | POA: Diagnosis not present

## 2021-10-15 ENCOUNTER — Other Ambulatory Visit: Payer: Self-pay | Admitting: Nurse Practitioner

## 2021-10-15 DIAGNOSIS — K7402 Hepatic fibrosis, advanced fibrosis: Secondary | ICD-10-CM

## 2021-10-15 DIAGNOSIS — Z8619 Personal history of other infectious and parasitic diseases: Secondary | ICD-10-CM | POA: Diagnosis not present

## 2021-10-20 ENCOUNTER — Ambulatory Visit
Admission: RE | Admit: 2021-10-20 | Discharge: 2021-10-20 | Disposition: A | Discharge status: Home / Self Care | Payer: BC Managed Care – PPO | Source: Ambulatory Visit | Attending: Nurse Practitioner | Admitting: Nurse Practitioner

## 2021-10-20 DIAGNOSIS — K7400 Hepatic fibrosis, unspecified: Secondary | ICD-10-CM | POA: Diagnosis not present

## 2021-10-20 DIAGNOSIS — K7402 Hepatic fibrosis, advanced fibrosis: Secondary | ICD-10-CM

## 2021-12-14 DIAGNOSIS — D649 Anemia, unspecified: Secondary | ICD-10-CM | POA: Diagnosis not present

## 2021-12-14 DIAGNOSIS — Z23 Encounter for immunization: Secondary | ICD-10-CM | POA: Diagnosis not present

## 2022-01-06 DIAGNOSIS — N529 Male erectile dysfunction, unspecified: Secondary | ICD-10-CM | POA: Diagnosis not present

## 2022-01-06 DIAGNOSIS — I10 Essential (primary) hypertension: Secondary | ICD-10-CM | POA: Diagnosis not present

## 2022-01-14 DIAGNOSIS — H401112 Primary open-angle glaucoma, right eye, moderate stage: Secondary | ICD-10-CM | POA: Diagnosis not present

## 2022-03-03 DIAGNOSIS — Z86010 Personal history of colonic polyps: Secondary | ICD-10-CM | POA: Diagnosis not present

## 2022-03-09 DIAGNOSIS — E860 Dehydration: Secondary | ICD-10-CM | POA: Diagnosis not present

## 2022-04-06 ENCOUNTER — Other Ambulatory Visit: Payer: Self-pay | Admitting: Family Medicine

## 2022-04-06 DIAGNOSIS — R911 Solitary pulmonary nodule: Secondary | ICD-10-CM

## 2022-04-19 ENCOUNTER — Other Ambulatory Visit: Payer: Self-pay | Admitting: Nurse Practitioner

## 2022-04-19 DIAGNOSIS — Z8619 Personal history of other infectious and parasitic diseases: Secondary | ICD-10-CM | POA: Diagnosis not present

## 2022-04-19 DIAGNOSIS — K7402 Hepatic fibrosis, advanced fibrosis: Secondary | ICD-10-CM | POA: Diagnosis not present

## 2022-04-27 IMAGING — US US ABDOMEN LIMITED
1 series · 14 of 25 positions shown · non-contrast
Comparison: Ultrasound abdomen 10/31/2020, ultrasound abdomen
05/01/2020

CLINICAL DATA: Hepatic fibrosis.  Advanced fibrosis. SCREEN FOR HCC

EXAM:
ULTRASOUND ABDOMEN LIMITED RIGHT UPPER QUADRANT

[Series 1: us abdomen limited · 0.23mm/px · 14 of 43 slices shown]
[im 1/43]
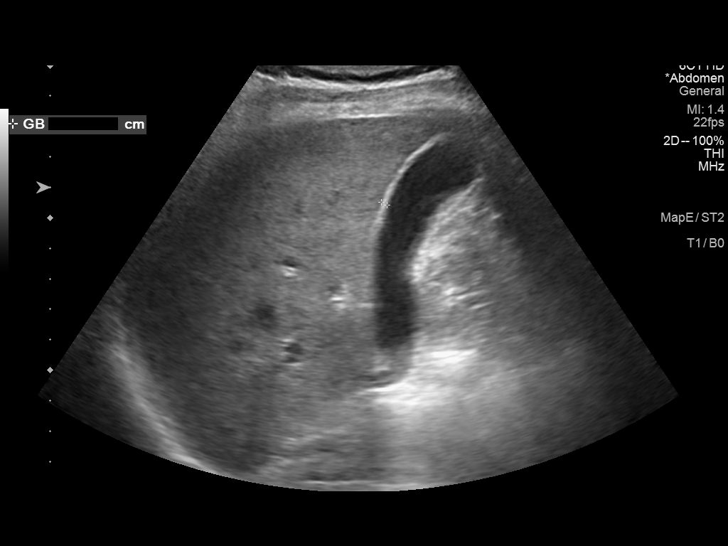
[im 4/43]
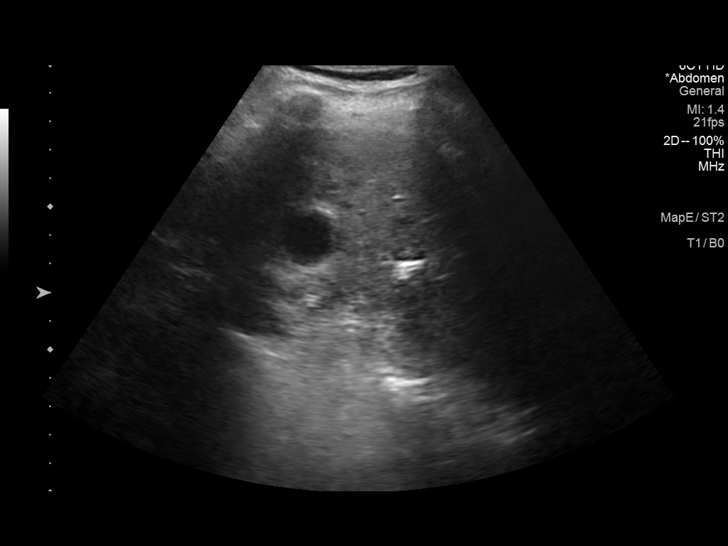
[im 8/43]
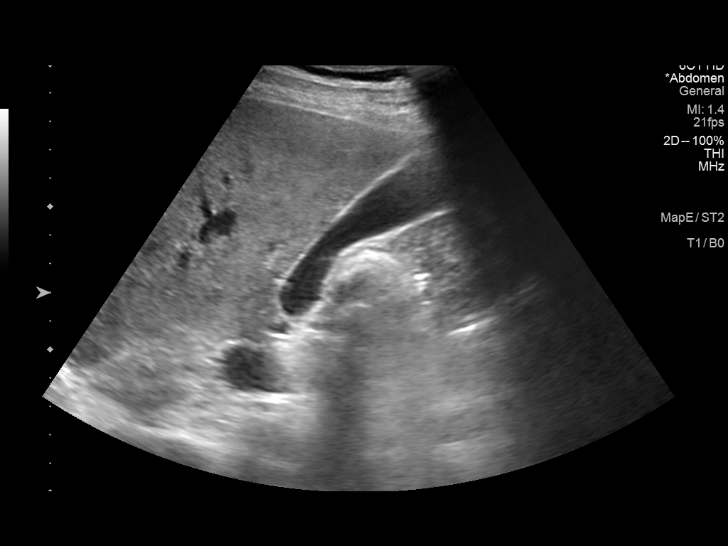
[im 11/43]
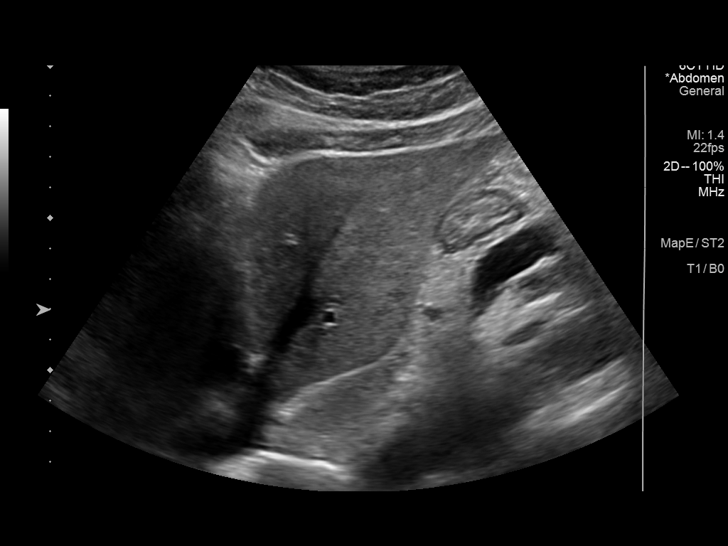
[im 15/43]
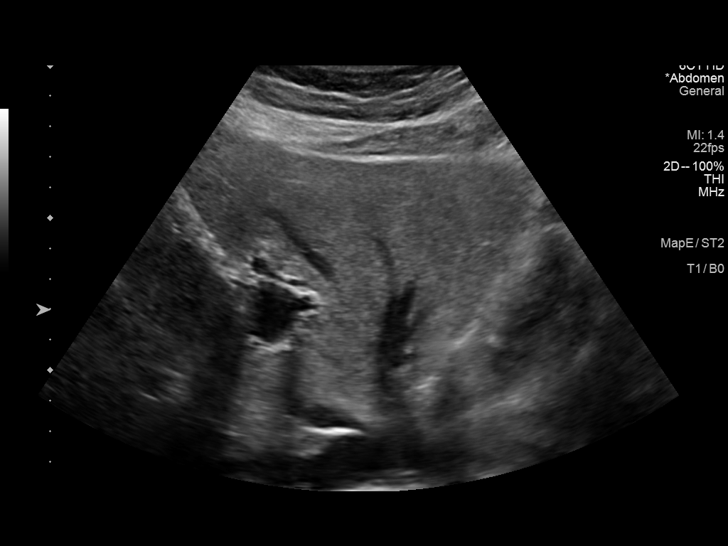
[im 16/43]
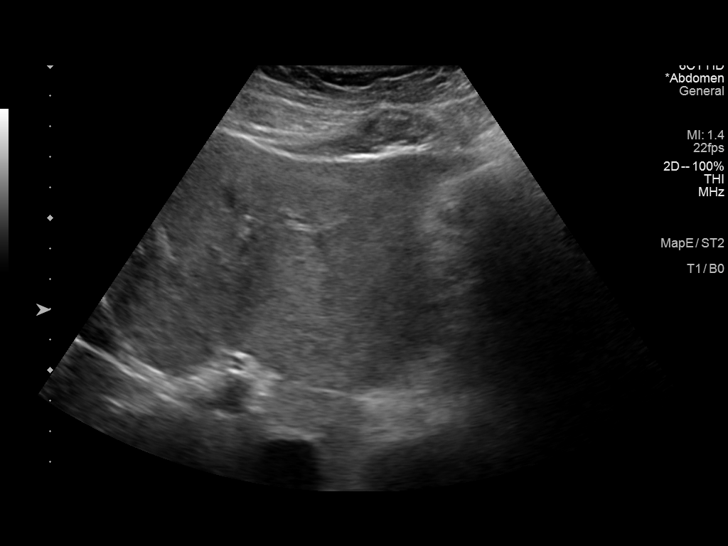
[im 20/43]
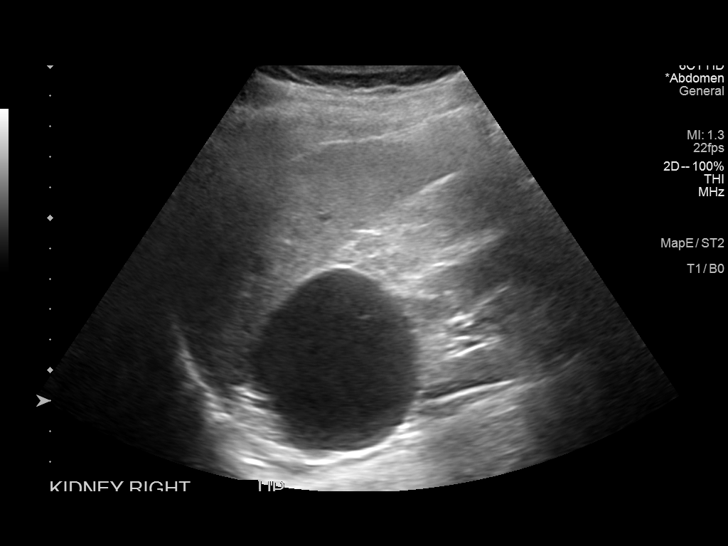
[im 23/43]
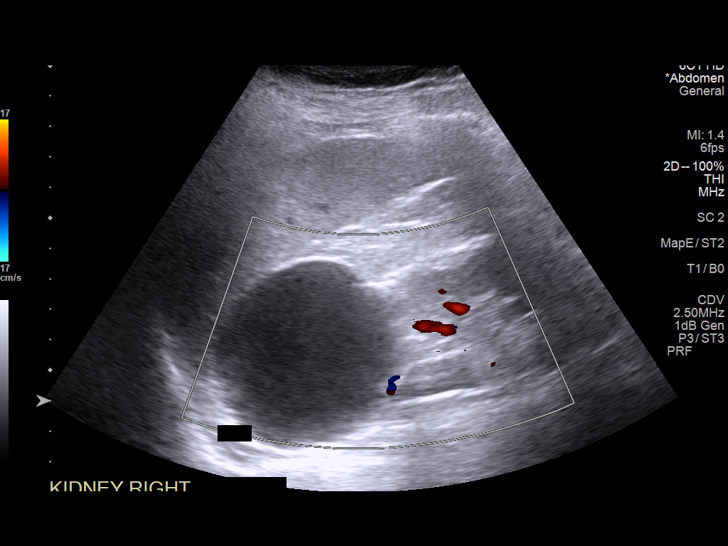
[im 27/43]
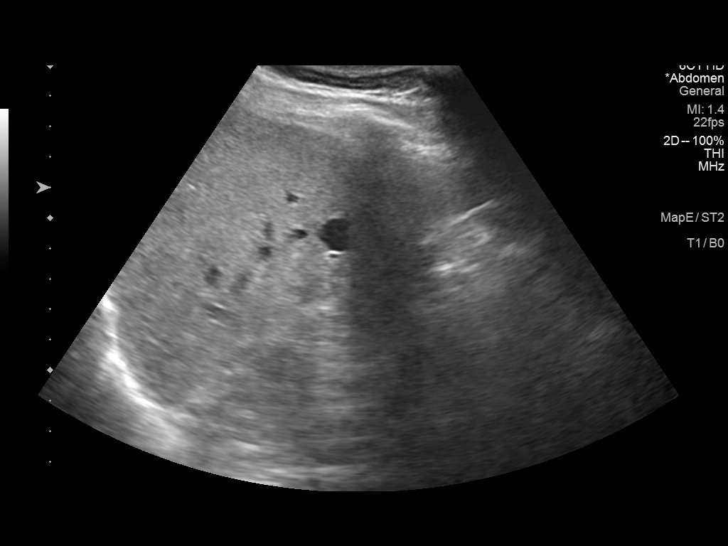
[im 29/43]
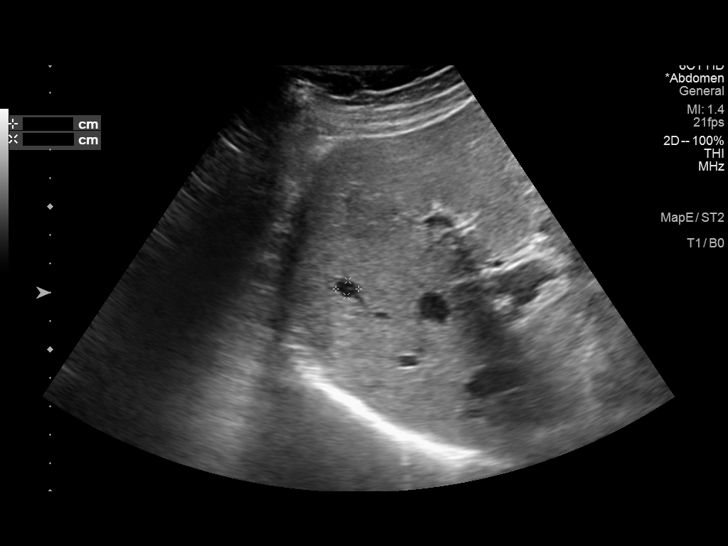
[im 32/43]
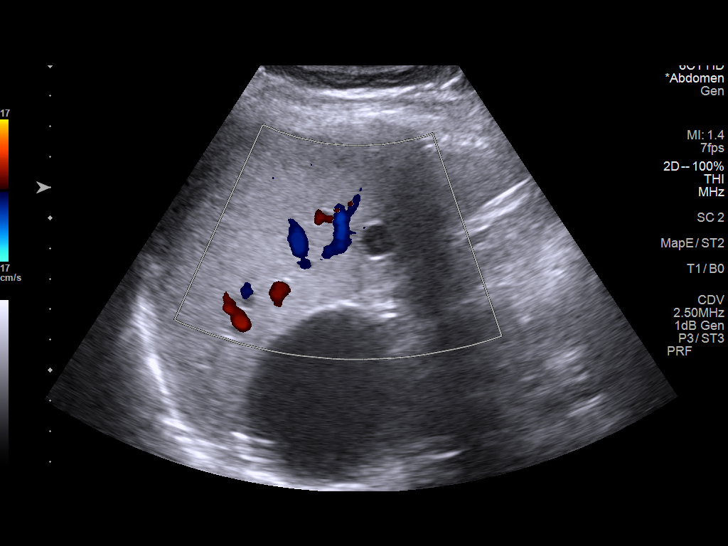
[im 36/43]
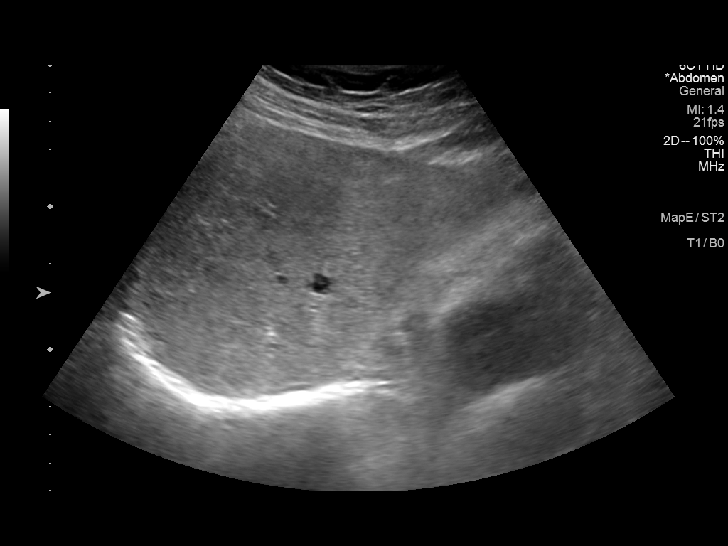
[im 39/43]
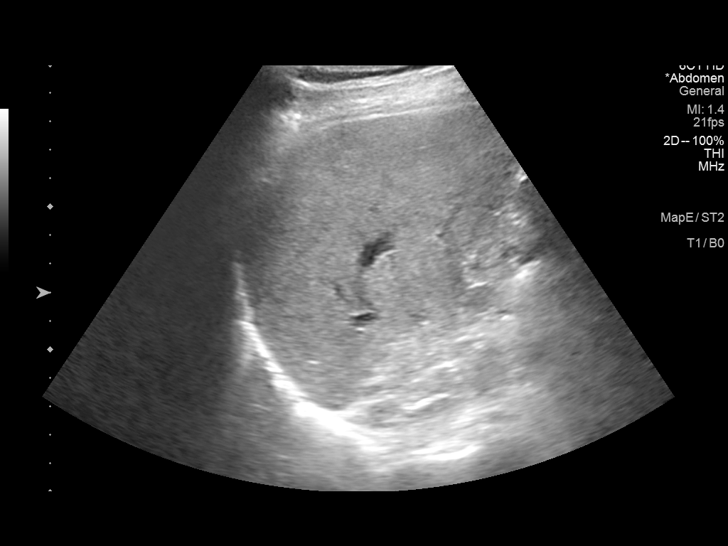
[im 43/43]
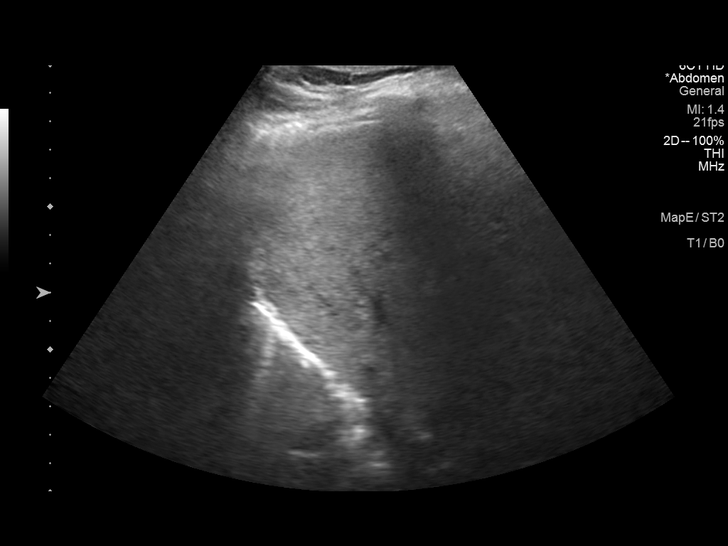

[14 of 25 positions shown; findings below may reference images not displayed]

FINDINGS: Gallbladder:

No gallstones or wall thickening visualized. No sonographic Murphy
sign noted by sonographer.

Common bile duct:

Diameter: 2 mm.

Liver:

Redemonstration of a 1 x 1 x 0.9 cm and 0.8 x 0.6 x 0.6 cm
hypoechoic cystic lesions. Coarsened and increased parenchymal
echogenicity. Portal vein is patent on color Doppler imaging with
normal direction of blood flow towards the liver.

Other: There is a partially visualized 5.5 x 6 x 5.6 cm right renal
cystic lesion.
IMPRESSION: 1. Hepatic steatosis and coarsened parenchymal echogenicity
consistent with hepatic fibrosis. Please note limited evaluation for
focal hepatic masses in a patient with hepatic steatosis due to
decreased penetration of the acoustic ultrasound waves. Recommend
MRI liver protocol for further evaluation.
2. A 6 cm right renal lesion can further be evaluated on MRI liver
protocol.

## 2022-04-28 ENCOUNTER — Ambulatory Visit
Admission: RE | Admit: 2022-04-28 | Discharge: 2022-04-28 | Disposition: A | Discharge status: Home / Self Care | Payer: BC Managed Care – PPO | Source: Ambulatory Visit | Attending: Nurse Practitioner | Admitting: Nurse Practitioner

## 2022-04-28 DIAGNOSIS — K7460 Unspecified cirrhosis of liver: Secondary | ICD-10-CM | POA: Diagnosis not present

## 2022-04-28 DIAGNOSIS — K7402 Hepatic fibrosis, advanced fibrosis: Secondary | ICD-10-CM

## 2022-05-05 ENCOUNTER — Ambulatory Visit
Admission: RE | Admit: 2022-05-05 | Discharge: 2022-05-05 | Disposition: A | Discharge status: Home / Self Care | Payer: BC Managed Care – PPO | Source: Ambulatory Visit | Attending: Family Medicine | Admitting: Family Medicine

## 2022-05-05 DIAGNOSIS — K449 Diaphragmatic hernia without obstruction or gangrene: Secondary | ICD-10-CM | POA: Diagnosis not present

## 2022-05-05 DIAGNOSIS — J479 Bronchiectasis, uncomplicated: Secondary | ICD-10-CM | POA: Diagnosis not present

## 2022-05-05 DIAGNOSIS — R918 Other nonspecific abnormal finding of lung field: Secondary | ICD-10-CM | POA: Diagnosis not present

## 2022-05-05 DIAGNOSIS — J84112 Idiopathic pulmonary fibrosis: Secondary | ICD-10-CM | POA: Diagnosis not present

## 2022-05-05 DIAGNOSIS — R911 Solitary pulmonary nodule: Secondary | ICD-10-CM

## 2022-05-12 DIAGNOSIS — K648 Other hemorrhoids: Secondary | ICD-10-CM | POA: Diagnosis not present

## 2022-05-12 DIAGNOSIS — Z86010 Personal history of colonic polyps: Secondary | ICD-10-CM | POA: Diagnosis not present

## 2022-05-12 DIAGNOSIS — D123 Benign neoplasm of transverse colon: Secondary | ICD-10-CM | POA: Diagnosis not present

## 2022-05-12 DIAGNOSIS — D122 Benign neoplasm of ascending colon: Secondary | ICD-10-CM | POA: Diagnosis not present

## 2022-05-12 DIAGNOSIS — K5730 Diverticulosis of large intestine without perforation or abscess without bleeding: Secondary | ICD-10-CM | POA: Diagnosis not present

## 2022-05-12 DIAGNOSIS — K644 Residual hemorrhoidal skin tags: Secondary | ICD-10-CM | POA: Diagnosis not present

## 2022-05-13 DIAGNOSIS — Z23 Encounter for immunization: Secondary | ICD-10-CM | POA: Diagnosis not present

## 2022-06-16 ENCOUNTER — Ambulatory Visit (INDEPENDENT_AMBULATORY_CARE_PROVIDER_SITE_OTHER): Payer: BC Managed Care – PPO | Admitting: Pulmonary Disease

## 2022-06-16 ENCOUNTER — Encounter: Payer: Self-pay | Admitting: Pulmonary Disease

## 2022-06-16 VITALS — BP 140/62 | HR 87 | Temp 97.8°F | Ht 73.0 in | Wt 185.4 lb

## 2022-06-16 DIAGNOSIS — J849 Interstitial pulmonary disease, unspecified: Secondary | ICD-10-CM | POA: Diagnosis not present

## 2022-06-16 MED ORDER — PANTOPRAZOLE SODIUM 20 MG PO TBEC: 20.0000 mg | DELAYED_RELEASE_TABLET | Freq: Every day | ORAL | 5 refills | Status: DC

## 2022-06-16 MED ORDER — BENZONATATE 200 MG PO CAPS: 200.0000 mg | ORAL_CAPSULE | Freq: Two times a day (BID) | ORAL | 1 refills | Status: DC | PRN

## 2022-06-16 NOTE — Patient Instructions (Addendum)
Will get labs today for further workup of scarring in the lung I will prescribe Tessalon Perles and Protonix 20 mg a day for acid reflux and cough for the labs I wanted a level 1 panel and angiotensin-converting enzyme and a hypersensitivity panel Will get PFTs and follow-up in 3 to 4 months

## 2022-06-16 NOTE — Progress Notes (Signed)
Andrew Douglas    [id number]    06-09-1946  Primary Care Physician:Reade, Molly Maduro, MD  Referring Physician: Elias Else, MD [phone] [address] [place],  [place] [address]  Chief complaint: Consult for interstitial lung disease  HPI: 76 y.o. 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  Denies any wheezing, dyspnea, fevers or chills.  He had COVID-19 in 2022 with symptoms for about a week.  Did not require hospitalization.  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  No feather pillows or comforters Smoking history: 20-pack-year smoker.  Quit in 2008 Travel history: No significant travel history Relevant family history: Mother had emphysema.  She was a smoker.   Outpatient Encounter Medications as of 06/16/2022  Medication Sig   aspirin EC 81 MG tablet Take 81 mg by mouth daily. Swallow whole.   dorzolamide-timolol (COSOPT) 2-0.5 % ophthalmic solution 1 drop 2 (two) times daily.   losartan-hydrochlorothiazide (HYZAAR) 100-12.5 MG tablet Take 1 tablet by mouth daily.   No facility-administered encounter medications on file as of 06/16/2022.    Allergies as of 06/16/2022   (No Known Allergies)    No past medical history on file.  History reviewed. No pertinent surgical history.  No family history on file.  Social History   Socioeconomic History   Marital status: Married    Spouse name: Not on file   Number of children: Not on file   Years of education: Not on file   Highest education level: Not on file  Occupational History   Not on file  Tobacco Use   Smoking status: Former    Packs/day: 1.00     Types: Cigarettes    Passive exposure: Past   Smokeless tobacco: Never   Tobacco comments:    Smoked 20years-1/2-1pk daily  Substance and Sexual Activity   Alcohol use: Not on file   Drug use: Not on file   Sexual activity: Not on file  Other Topics Concern   Not on file  Social History Narrative   Not on file   Social Determinants of Health   Financial Resource Strain: Not on file  Food Insecurity: Not on file  Transportation Needs: Not on file  Physical Activity: Not on file  Stress: Not on file  Social Connections: Not on file  Intimate Partner Violence: Not on file    Review of systems: Review of Systems  Constitutional: Negative for fever and chills.  HENT: Negative.   Eyes: Negative for blurred vision.  Respiratory: as per HPI  Cardiovascular: Negative for chest pain and palpitations.  Gastrointestinal: Negative for vomiting, diarrhea, blood per rectum. Genitourinary: Negative for dysuria, urgency, frequency and hematuria.  Musculoskeletal: Negative for myalgias, back pain and joint pain.  Skin: Negative for itching and rash.  Neurological: Negative for dizziness, tremors, focal weakness, seizures and loss of consciousness.  Endo/Heme/Allergies: Negative for environmental allergies.  Psychiatric/Behavioral: Negative for depression, suicidal ideas and hallucinations.  All other systems reviewed and are negative.  Physical Exam: Blood pressure (!) 140/62, pulse 87, temperature 97.8 F (36.6 C), temperature source Oral, height 6\' 1"  (1.854 m), weight 185 lb 6.4 oz (84.1 kg),  SpO2 96 %. Gen:      No acute distress HEENT:  EOMI, sclera anicteric Neck:     No masses; no thyromegaly Lungs:    Clear to auscultation bilaterally; normal respiratory effort CV:         Regular rate and rhythm; no murmurs Abd:      + bowel sounds; soft, non-tender; no palpable masses, no distension Ext:    No edema; adequate peripheral perfusion Skin:      Warm and dry; no rash Neuro:  alert and oriented x 3 Psych: normal mood and affect  Data Reviewed: Imaging: CT chest 08/10/2021-upper lobe predominant fibrotic changes, lung nodules.  High resolution CT 05/07/2022-upper lobe subpleural reticular opacities with traction bronchiectasis, honeycomb changes.  Alternate diagnosis.  Favor fibrotic sarcoid or chronic HP. Right upper lobe lung nodule has resolved.  Additional lung nodules are stable I had reviewed the images personally.  PFTs:  Labs:  Assessment:  Evaluation for abnormal CT, interstitial lung disease Review of his CT scan shows upper lobe predominant fibrotic changes in alternate pattern.  Findings are suggestive of chronic HP versus sarcoidosis but he does not give any exposure history or symptoms.  Irrespective of the etiology there is no suggestion of active disease and it looks like postinflammatory scarring.  I do not think undergoing a biopsy bronchoscopy very helpful at this point Will get PFTs and continue to monitor.  Check baseline labs including connective tissue disease profile, angiotensin-converting enzyme and hypersensitivity panel If there is progression of disease then consider antifibrotics  Chronic cough May be secondary to GERD versus ILD Prescribe Tessalon for symptomatic relief Start Prilosec 20 mg daily  Lung nodule The right upper lobe nodule has resolved on recent CT scan.  He has other stable lung nodules that we will need follow-up.  Review  Plan/Recommendations: PFTs Labs  Marshell Garfinkel MD [facility] 06/16/2022, 9:32 AM  CC: Maury Dus, MD

## 2022-06-17 ENCOUNTER — Other Ambulatory Visit: Payer: Self-pay | Admitting: Physician Assistant

## 2022-06-17 DIAGNOSIS — Z125 Encounter for screening for malignant neoplasm of prostate: Secondary | ICD-10-CM | POA: Diagnosis not present

## 2022-06-17 DIAGNOSIS — R7303 Prediabetes: Secondary | ICD-10-CM | POA: Diagnosis not present

## 2022-06-17 DIAGNOSIS — I714 Abdominal aortic aneurysm, without rupture, unspecified: Secondary | ICD-10-CM | POA: Diagnosis not present

## 2022-06-17 DIAGNOSIS — Z23 Encounter for immunization: Secondary | ICD-10-CM | POA: Diagnosis not present

## 2022-06-17 DIAGNOSIS — I700 Atherosclerosis of aorta: Secondary | ICD-10-CM | POA: Diagnosis not present

## 2022-06-17 DIAGNOSIS — Z0000 Encounter for general adult medical examination without abnormal findings: Secondary | ICD-10-CM | POA: Diagnosis not present

## 2022-06-17 DIAGNOSIS — Z13220 Encounter for screening for lipoid disorders: Secondary | ICD-10-CM | POA: Diagnosis not present

## 2022-06-17 DIAGNOSIS — I10 Essential (primary) hypertension: Secondary | ICD-10-CM | POA: Diagnosis not present

## 2022-06-19 LAB — RHEUMATOID FACTOR: Rheumatoid fact SerPl-aCnc: 21 IU/mL — ABNORMAL HIGH (ref <14)

## 2022-06-19 LAB — ANA: Anti Nuclear Antibody (ANA): NEGATIVE

## 2022-06-19 LAB — ANTI-DNA ANTIBODY, DOUBLE-STRANDED: ds DNA Ab: 1 IU/mL

## 2022-06-19 LAB — CYCLIC CITRUL PEPTIDE ANTIBODY, IGG: Cyclic Citrullin Peptide Ab: <16 UNITS

## 2022-06-21 ENCOUNTER — Ambulatory Visit
Admission: RE | Admit: 2022-06-21 | Discharge: 2022-06-21 | Disposition: A | Discharge status: Home / Self Care | Payer: BC Managed Care – PPO | Source: Ambulatory Visit | Attending: Physician Assistant | Admitting: Physician Assistant

## 2022-06-21 DIAGNOSIS — I77819 Aortic ectasia, unspecified site: Secondary | ICD-10-CM | POA: Diagnosis not present

## 2022-06-21 DIAGNOSIS — I714 Abdominal aortic aneurysm, without rupture, unspecified: Secondary | ICD-10-CM

## 2022-06-23 DIAGNOSIS — R011 Cardiac murmur, unspecified: Secondary | ICD-10-CM | POA: Diagnosis not present

## 2022-06-24 LAB — HYPERSENSITIVITY PNEUMONITIS
A. Pullulans Abs: NEGATIVE
A.Fumigatus #1 Abs: NEGATIVE
Micropolyspora faeni, IgG: NEGATIVE
Pigeon Serum Abs: NEGATIVE
Thermoact. Saccharii: NEGATIVE
Thermoactinomyces vulgaris, IgG: NEGATIVE

## 2022-09-28 ENCOUNTER — Other Ambulatory Visit: Payer: Self-pay | Admitting: Pulmonary Disease

## 2022-10-16 IMAGING — US US ABDOMEN LIMITED
1 series · 14 of 25 positions shown · non-contrast
Comparison: Ultrasound abdomen 05/01/2021

CLINICAL DATA: Hepatic fibrosis, advanced fibrosis.  SCREEN FOR HCC

EXAM:
ULTRASOUND ABDOMEN LIMITED RIGHT UPPER QUADRANT

[Series 1: us abdomen limited · 0.20mm/px · 14 of 41 slices shown]
[im 1/41]
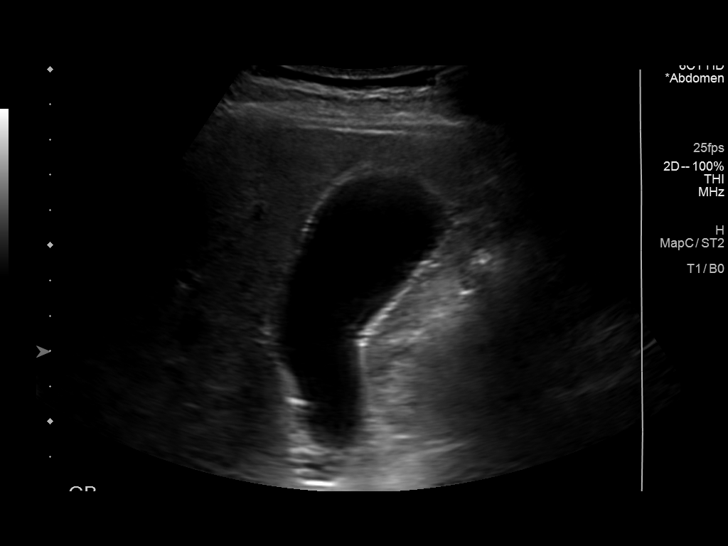
[im 4/41]
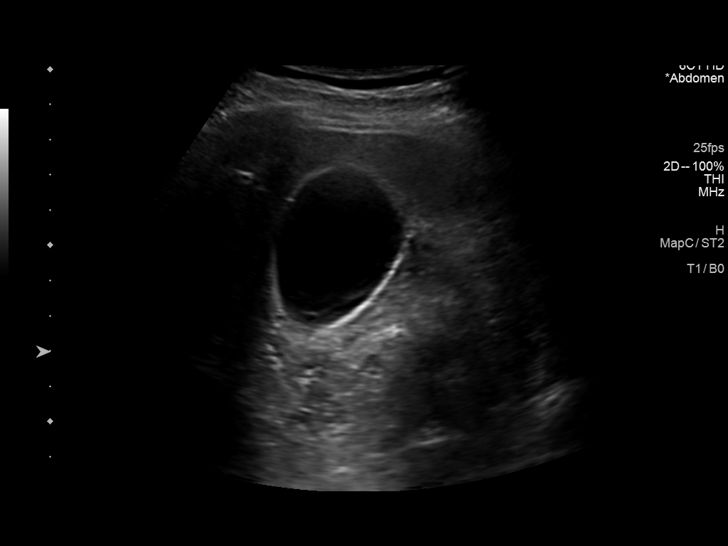
[im 7/41]
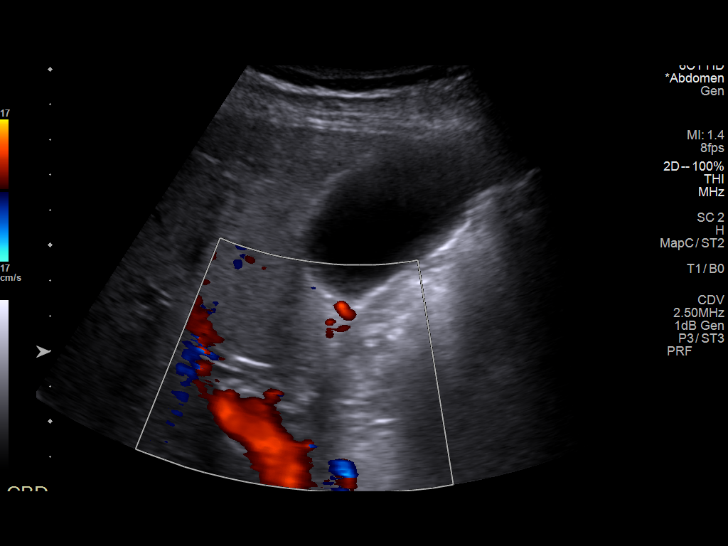
[im 11/41]
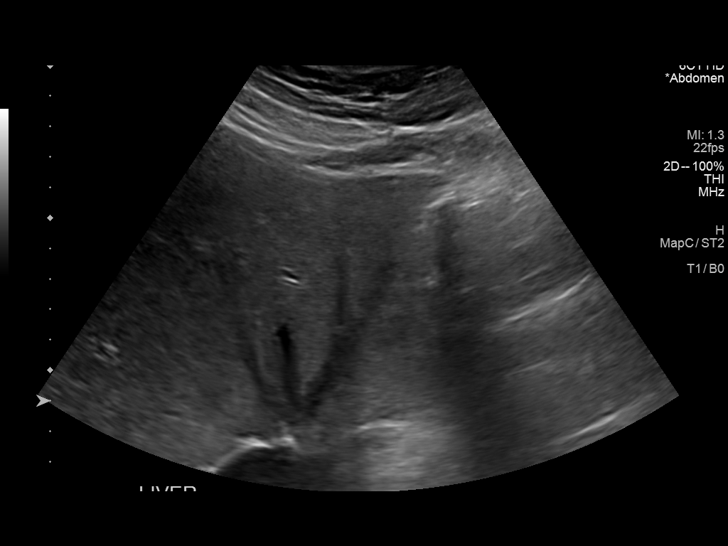
[im 14/41]
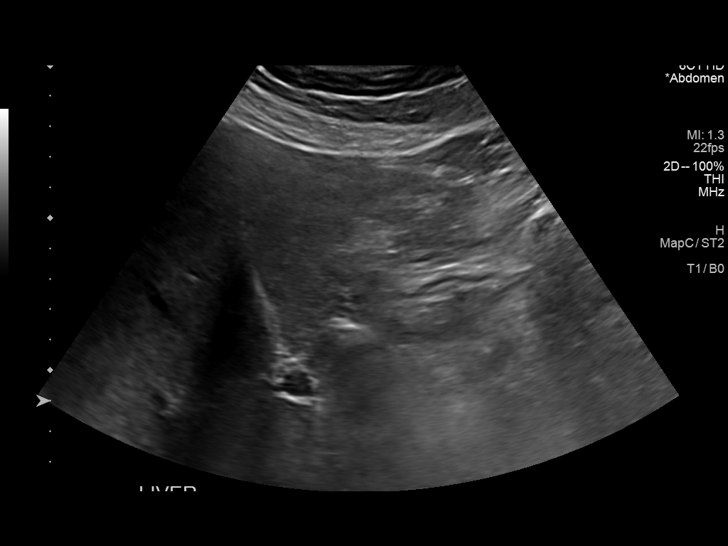
[im 16/41]
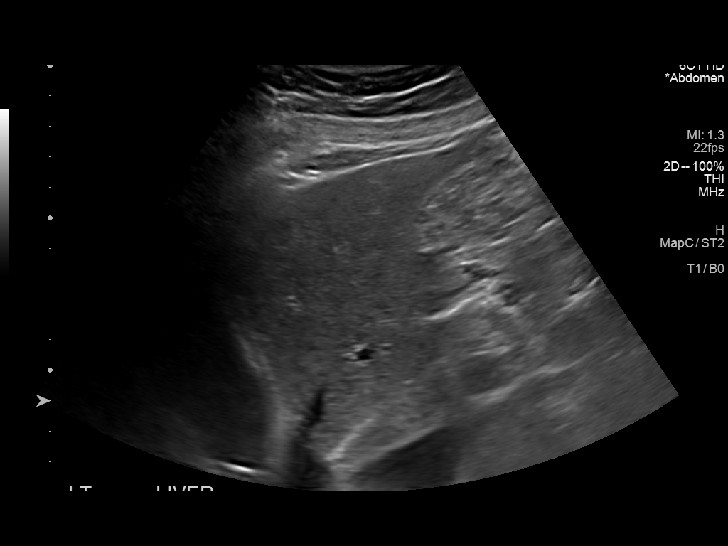
[im 19/41]
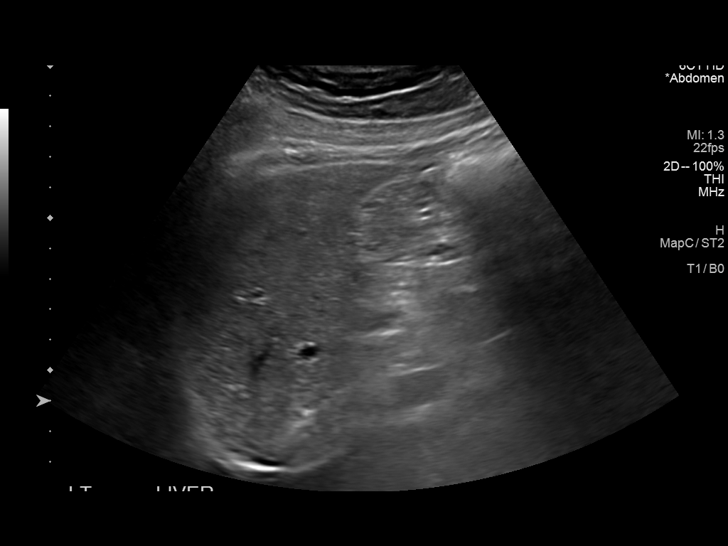
[im 22/41]
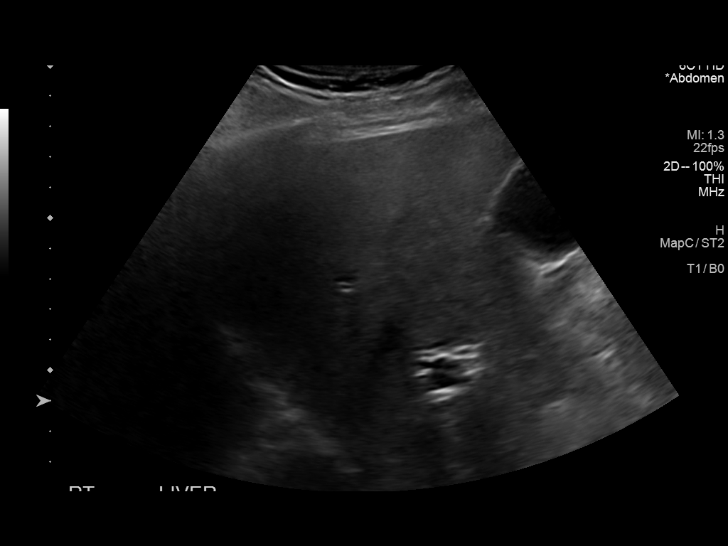
[im 26/41]
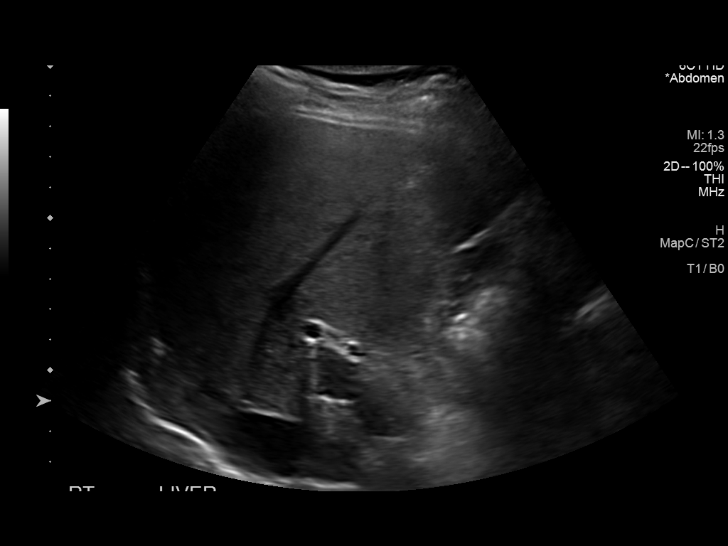
[im 27/41]
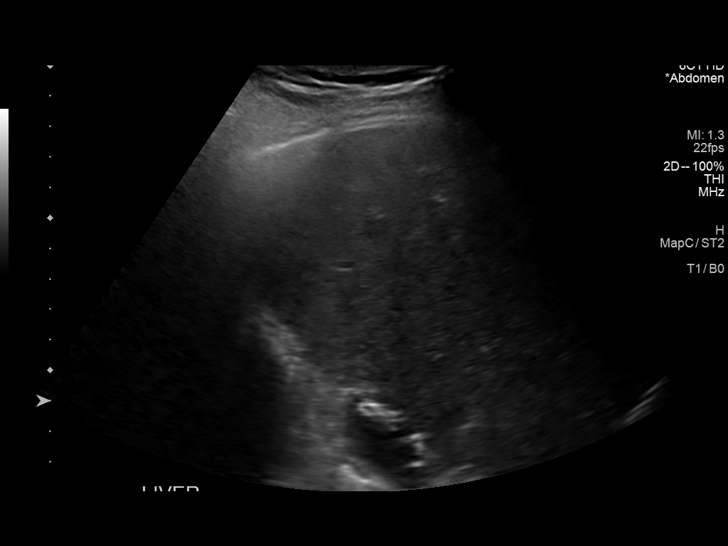
[im 31/41]
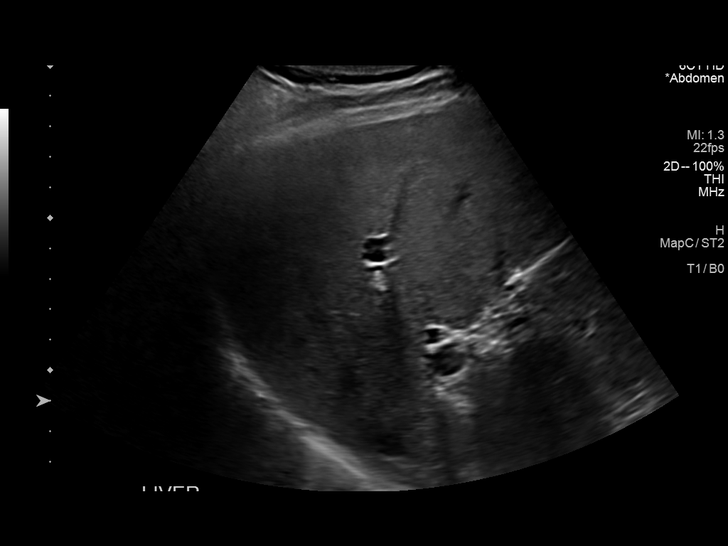
[im 34/41]
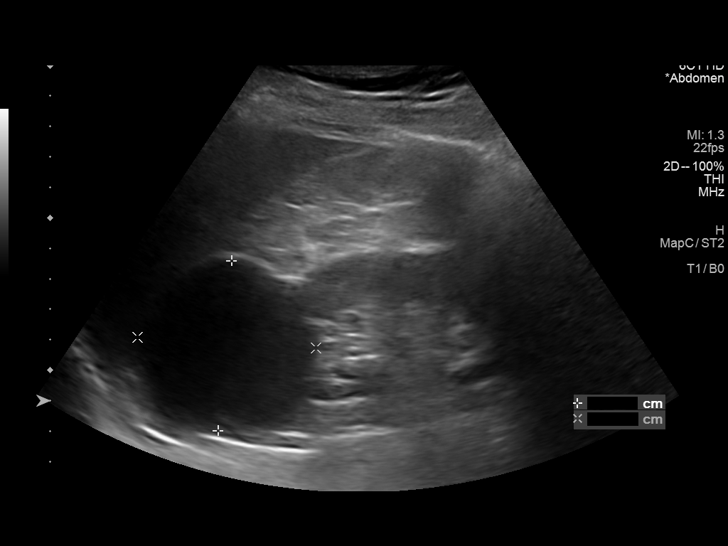
[im 37/41]
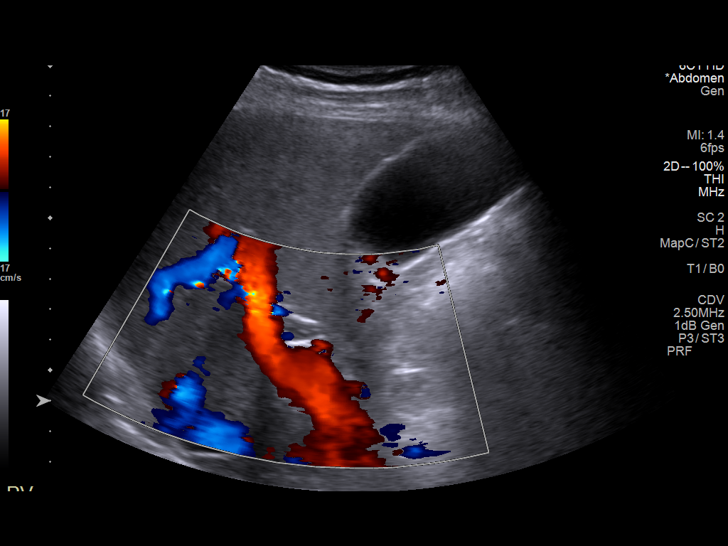
[im 41/41]
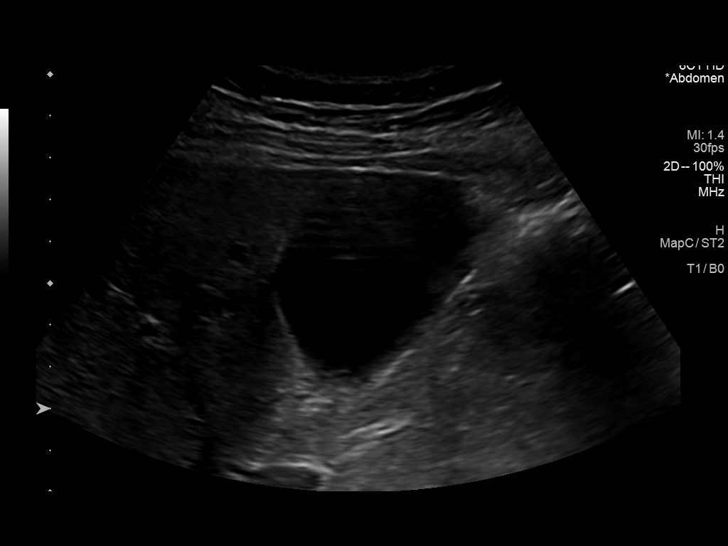

[14 of 25 positions shown; findings below may reference images not displayed]

FINDINGS: Gallbladder:

No gallstones or wall thickening visualized. No sonographic Murphy
sign noted by sonographer.

Common bile duct:

Diameter: 3mm.

Liver:

No focal lesion identified. Coarsened parenchymal echogenicity.
Portal vein is patent on color Doppler imaging with normal direction
of blood flow towards the liver.

Other: There is a hypoechoic 5.6 x 5.9 x 6.4 cm superior right renal
pole lesion likely representing a simple renal cyst.
IMPRESSION: Coarsened parenchymal echogenicity consistent with given history of
fibrosis. No focal liver lesions identified by ultrasound. Please
note that liver protocol enhanced MR and CT are the most sensitive
tests for the screening detection of hepatocellular carcinoma in the
high risk setting of cirrhosis.

## 2022-10-18 DIAGNOSIS — H401112 Primary open-angle glaucoma, right eye, moderate stage: Secondary | ICD-10-CM | POA: Diagnosis not present

## 2022-10-21 ENCOUNTER — Other Ambulatory Visit: Payer: Self-pay | Admitting: Nurse Practitioner

## 2022-10-21 DIAGNOSIS — Z8619 Personal history of other infectious and parasitic diseases: Secondary | ICD-10-CM | POA: Diagnosis not present

## 2022-10-21 DIAGNOSIS — K7402 Hepatic fibrosis, advanced fibrosis: Secondary | ICD-10-CM | POA: Diagnosis not present

## 2022-11-17 ENCOUNTER — Ambulatory Visit
Admission: RE | Admit: 2022-11-17 | Discharge: 2022-11-17 | Disposition: A | Discharge status: Home / Self Care | Payer: BC Managed Care – PPO | Source: Ambulatory Visit | Attending: Nurse Practitioner | Admitting: Nurse Practitioner

## 2022-11-17 DIAGNOSIS — K7402 Hepatic fibrosis, advanced fibrosis: Secondary | ICD-10-CM

## 2022-11-17 DIAGNOSIS — K7400 Hepatic fibrosis, unspecified: Secondary | ICD-10-CM | POA: Diagnosis not present

## 2022-12-31 ENCOUNTER — Ambulatory Visit: Payer: BC Managed Care – PPO | Admitting: Pulmonary Disease

## 2023-01-03 ENCOUNTER — Other Ambulatory Visit: Payer: Self-pay | Admitting: Pulmonary Disease

## 2023-01-26 ENCOUNTER — Ambulatory Visit: Payer: BC Managed Care – PPO | Admitting: Pulmonary Disease

## 2023-02-18 DIAGNOSIS — H401112 Primary open-angle glaucoma, right eye, moderate stage: Secondary | ICD-10-CM | POA: Diagnosis not present

## 2023-04-27 ENCOUNTER — Other Ambulatory Visit: Payer: Self-pay | Admitting: Nurse Practitioner

## 2023-04-27 DIAGNOSIS — Z8619 Personal history of other infectious and parasitic diseases: Secondary | ICD-10-CM | POA: Diagnosis not present

## 2023-04-27 DIAGNOSIS — K7402 Hepatic fibrosis, advanced fibrosis: Secondary | ICD-10-CM | POA: Diagnosis not present

## 2023-04-27 DIAGNOSIS — Z23 Encounter for immunization: Secondary | ICD-10-CM | POA: Diagnosis not present

## 2023-05-04 ENCOUNTER — Ambulatory Visit: Payer: BC Managed Care – PPO | Admitting: Pulmonary Disease

## 2023-05-04 DIAGNOSIS — J849 Interstitial pulmonary disease, unspecified: Secondary | ICD-10-CM | POA: Diagnosis not present

## 2023-05-04 NOTE — Patient Instructions (Signed)
Full PFT performed today. °

## 2023-05-04 NOTE — Progress Notes (Signed)
Full PFT performed today. °

## 2023-05-05 ENCOUNTER — Ambulatory Visit: Payer: BC Managed Care – PPO | Admitting: Pulmonary Disease

## 2023-05-05 ENCOUNTER — Encounter: Payer: Self-pay | Admitting: Pulmonary Disease

## 2023-05-05 VITALS — BP 138/78 | HR 88 | Temp 97.8°F | Ht 73.0 in | Wt 199.0 lb

## 2023-05-05 DIAGNOSIS — J849 Interstitial pulmonary disease, unspecified: Secondary | ICD-10-CM | POA: Diagnosis not present

## 2023-05-05 DIAGNOSIS — R911 Solitary pulmonary nodule: Secondary | ICD-10-CM

## 2023-05-05 LAB — PULMONARY FUNCTION TEST
DL/VA % pred: 120 %
DL/VA: 4.65 ml/min/mmHg/L
DLCO cor % pred: 81 %
DLCO cor: 22.09 ml/min/mmHg
DLCO unc % pred: 80 %
DLCO unc: 21.77 ml/min/mmHg
FEF 25-75 Post: 3.12 L/s
FEF 25-75 Pre: 2.74 L/s
FEF2575-%Change-Post: 13 %
FEF2575-%Pred-Post: 132 %
FEF2575-%Pred-Pre: 116 %
FEV1-%Change-Post: 2 %
FEV1-%Pred-Post: 87 %
FEV1-%Pred-Pre: 84 %
FEV1-Post: 2.91 L
FEV1-Pre: 2.83 L
FEV1FVC-%Change-Post: 0 %
FEV1FVC-%Pred-Pre: 110 %
FEV6-%Change-Post: 2 %
FEV6-%Pred-Post: 83 %
FEV6-%Pred-Pre: 81 %
FEV6-Post: 3.63 L
FEV6-Pre: 3.54 L
FEV6FVC-%Change-Post: 0 %
FEV6FVC-%Pred-Post: 105 %
FEV6FVC-%Pred-Pre: 106 %
FVC-%Change-Post: 2 %
FVC-%Pred-Post: 78 %
FVC-%Pred-Pre: 76 %
FVC-Post: 3.65 L
FVC-Pre: 3.56 L
Post FEV1/FVC ratio: 80 %
Post FEV6/FVC ratio: 99 %
Pre FEV1/FVC ratio: 79 %
Pre FEV6/FVC Ratio: 100 %
RV % pred: 77 %
RV: 2.14 L
TLC % pred: 74 %
TLC: 5.71 L

## 2023-05-05 NOTE — Progress Notes (Signed)
Andrew Douglas    [id number]    July 29, 1945  Primary Care Physician:Reade, Molly Maduro, MD (Inactive)  Referring Physician: No referring provider defined for this encounter.  Chief complaint: Follow up for interstitial lung disease  HPI: 77 y.o. 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  Denies any wheezing, dyspnea, fevers or chills.  He had COVID-19 in 2022 with symptoms for about a week.  Did not require hospitalization.  He had a CT scan which showed some interstitial changes and has been referred here for further evaluation.  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  No feather pillows or comforters Smoking history: 20-pack-year smoker.  Quit in 2008 Travel history: No significant travel history Relevant family history: Mother had emphysema.  She was a smoker.  Interim history: Discussed the use of AI scribe software for clinical note transcription with the patient, who gave verbal consent to proceed.  The patient, with a history of interstitial lung disease, presents for a follow-up visit. He has been previously informed of the presence of fibrotic pulmonary fibrosis in the upper lobes of his lungs, a condition also referred to as interstitial lung disease. The etiology of this condition remains uncertain, but sarcoidosis, an inflammatory disease more common in African Americans, has been suggested as a possible cause.  The patient reports no issues with his breathing and overall feels well. This is believed to be a result of the scarring in the lungs.  However, he reports no joint pains or stiffness, which are common symptoms associated with this elevation. The patient recently received a flu shot and experienced a mild  cold as a reaction, but has since recovered.   Outpatient Encounter Medications as of 05/05/2023  Medication Sig   aspirin EC 81 MG tablet Take 81 mg by mouth daily. Swallow whole.   benzonatate (TESSALON) 200 MG capsule TAKE 1 CAPSULE BY MOUTH 2 TIMES DAILY AS NEEDED FOR COUGH   dorzolamide-timolol (COSOPT) 2-0.5 % ophthalmic solution 1 drop 2 (two) times daily.   losartan-hydrochlorothiazide (HYZAAR) 100-12.5 MG tablet Take 1 tablet by mouth daily.   pantoprazole (PROTONIX) 20 MG tablet Take 1 tablet (20 mg total) by mouth daily.   No facility-administered encounter medications on file as of 05/05/2023.    Physical Exam: Blood pressure (!) 140/62, pulse 87, temperature 97.8 F (36.6 C), temperature source Oral, height 6\' 1"  (1.854 m), weight 185 lb 6.4 oz (84.1 kg), SpO2 96 %. Gen:      No acute distress HEENT:  EOMI, sclera anicteric Neck:     No masses; no thyromegaly Lungs:    Clear to auscultation bilaterally; normal respiratory effort CV:         Regular rate and rhythm; no murmurs Abd:      + bowel sounds; soft, non-tender; no palpable masses, no distension Ext:    No edema; adequate peripheral perfusion Skin:      Warm and dry; no rash Neuro: alert and oriented x 3 Psych: normal mood and affect  Data Reviewed: Imaging: CT chest 08/10/2021-upper lobe predominant fibrotic changes, lung nodules.  High resolution CT 05/07/2022-upper lobe subpleural reticular opacities with traction bronchiectasis, honeycomb changes.  Alternate diagnosis.  Favor fibrotic sarcoid or chronic HP. Right upper lobe lung nodule has resolved.  Additional lung nodules are stable I had reviewed the images personally.  PFTs: 05/04/2023 FVC 3.65 [78%], FEV1 2.91 [87%], F/F80, TLC 5.71 [74%], DLCO 21.77 [80%] Mild restriction  Labs: ANA, CCP, double-stranded DNA, rheumatoid factor, hypersensitivity pneumonitis 06/16/2022-all negative except for rheumatoid factor of 21  Assessment:  Evaluation for  abnormal CT, interstitial lung disease Review of his CT scan shows upper lobe predominant fibrotic changes in alternate pattern.  Findings are suggestive of chronic HP versus sarcoidosis but he does not give any exposure history or symptoms.  Irrespective of the etiology there is no suggestion of active disease and it looks like postinflammatory scarring.  I do not think undergoing a biopsy bronchoscopy very helpful at this point Mild restriction noted on pulmonary function test, likely secondary to fibrosis.  Elevated Rheumatoid Factor Mild elevation noted on recent labs. No clinical signs of rheumatoid arthritis (no joint pain or morning stiffness). -Monitor for development of symptoms.  Chronic cough May be secondary to GERD versus ILD Prescribe Tessalon for symptomatic relief Prilosec  Lung nodule The right upper lobe nodule has resolved on recent CT scan.  He has other stable lung nodules that we will need monitoring  General Health Maintenance -Continue annual flu vaccination.   Plan/Recommendations: CT chest without contrast Follow-up in 6 months  Chilton Greathouse MD [facility] 05/05/2023, 8:34 AM  CC: No ref. provider found

## 2023-05-05 NOTE — Patient Instructions (Signed)
VISIT SUMMARY:  You came in today for a follow-up visit regarding your interstitial lung disease. You reported feeling well overall with no breathing issues, and you recently recovered from a mild cold after receiving your flu shot.  YOUR PLAN:  -INTERSTITIAL LUNG DISEASE (ILD): Interstitial lung disease involves scarring in the lungs, which can make it difficult to breathe. Your condition, with fibrosis in the upper lobes, is possibly due to a prior inflammatory process like sarcoidosis. Currently, you have no symptoms or signs of active disease. We will monitor your condition with an annual CT scan and pulmonary function test. Please return to the clinic in 6 months or sooner if you develop any symptoms.  -ELEVATED RHEUMATOID FACTOR: An elevated rheumatoid factor can be a sign of rheumatoid arthritis, an autoimmune disease that causes joint pain and stiffness. Although your recent labs showed a mild elevation, you do not have any symptoms of rheumatoid arthritis. We will continue to monitor for any development of symptoms.  -GENERAL HEALTH MAINTENANCE: Continue with your annual flu vaccination to help protect against the flu.  INSTRUCTIONS:  Please return to the clinic in 6 months for a follow-up visit, or sooner if you develop any symptoms. We will also monitor your condition with an annual CT scan and pulmonary function test.

## 2023-05-11 ENCOUNTER — Other Ambulatory Visit: Payer: BC Managed Care – PPO

## 2023-05-13 DIAGNOSIS — J069 Acute upper respiratory infection, unspecified: Secondary | ICD-10-CM | POA: Diagnosis not present

## 2023-05-20 ENCOUNTER — Other Ambulatory Visit: Payer: Self-pay | Admitting: Pulmonary Disease

## 2023-05-20 ENCOUNTER — Other Ambulatory Visit: Payer: BC Managed Care – PPO

## 2023-05-25 ENCOUNTER — Ambulatory Visit
Admission: RE | Admit: 2023-05-25 | Discharge: 2023-05-25 | Disposition: A | Discharge status: Home / Self Care | Payer: BC Managed Care – PPO | Source: Ambulatory Visit | Attending: Nurse Practitioner | Admitting: Nurse Practitioner

## 2023-05-25 DIAGNOSIS — K7402 Hepatic fibrosis, advanced fibrosis: Secondary | ICD-10-CM | POA: Diagnosis not present

## 2023-06-20 ENCOUNTER — Encounter: Payer: Self-pay | Admitting: Pulmonary Disease

## 2023-06-21 ENCOUNTER — Inpatient Hospital Stay
Admission: RE | Admit: 2023-06-21 | Discharge: 2023-06-21 | Discharge status: Home / Self Care | Payer: BC Managed Care – PPO | Source: Ambulatory Visit | Attending: Pulmonary Disease

## 2023-06-21 DIAGNOSIS — H401112 Primary open-angle glaucoma, right eye, moderate stage: Secondary | ICD-10-CM | POA: Diagnosis not present

## 2023-06-21 DIAGNOSIS — R911 Solitary pulmonary nodule: Secondary | ICD-10-CM

## 2023-06-29 ENCOUNTER — Other Ambulatory Visit: Payer: Self-pay | Admitting: Pulmonary Disease

## 2023-07-05 DIAGNOSIS — N529 Male erectile dysfunction, unspecified: Secondary | ICD-10-CM | POA: Diagnosis not present

## 2023-07-05 DIAGNOSIS — R7303 Prediabetes: Secondary | ICD-10-CM | POA: Diagnosis not present

## 2023-07-05 DIAGNOSIS — Z125 Encounter for screening for malignant neoplasm of prostate: Secondary | ICD-10-CM | POA: Diagnosis not present

## 2023-07-05 DIAGNOSIS — I10 Essential (primary) hypertension: Secondary | ICD-10-CM | POA: Diagnosis not present

## 2023-07-05 DIAGNOSIS — J849 Interstitial pulmonary disease, unspecified: Secondary | ICD-10-CM | POA: Diagnosis not present

## 2023-07-05 DIAGNOSIS — E7800 Pure hypercholesterolemia, unspecified: Secondary | ICD-10-CM | POA: Diagnosis not present

## 2023-07-05 DIAGNOSIS — Z0000 Encounter for general adult medical examination without abnormal findings: Secondary | ICD-10-CM | POA: Diagnosis not present

## 2023-07-05 DIAGNOSIS — D649 Anemia, unspecified: Secondary | ICD-10-CM | POA: Diagnosis not present

## 2023-07-05 DIAGNOSIS — Z8619 Personal history of other infectious and parasitic diseases: Secondary | ICD-10-CM | POA: Diagnosis not present

## 2023-08-20 ENCOUNTER — Other Ambulatory Visit: Payer: Self-pay | Admitting: Pulmonary Disease

## 2023-08-22 NOTE — Telephone Encounter (Signed)
 Pt is requesting medication refill for pantoprazole . Medication is not listed on lov note. Please advise Dr. Waylan Haggard

## 2023-09-29 DIAGNOSIS — R5383 Other fatigue: Secondary | ICD-10-CM | POA: Diagnosis not present

## 2023-09-29 DIAGNOSIS — R058 Other specified cough: Secondary | ICD-10-CM | POA: Diagnosis not present

## 2023-09-29 DIAGNOSIS — K30 Functional dyspepsia: Secondary | ICD-10-CM | POA: Diagnosis not present

## 2023-09-30 ENCOUNTER — Other Ambulatory Visit: Payer: Self-pay

## 2023-09-30 ENCOUNTER — Encounter (HOSPITAL_COMMUNITY): Payer: Self-pay

## 2023-09-30 ENCOUNTER — Emergency Department (HOSPITAL_COMMUNITY)

## 2023-09-30 ENCOUNTER — Inpatient Hospital Stay (HOSPITAL_COMMUNITY)
Admission: EM | Admit: 2023-09-30 | Discharge: 2023-10-05 | DRG: 417 | Disposition: A | Discharge status: Home / Self Care | Attending: Family Medicine | Admitting: Family Medicine

## 2023-09-30 DIAGNOSIS — K8012 Calculus of gallbladder with acute and chronic cholecystitis without obstruction: Secondary | ICD-10-CM | POA: Diagnosis not present

## 2023-09-30 DIAGNOSIS — Z8619 Personal history of other infectious and parasitic diseases: Secondary | ICD-10-CM | POA: Diagnosis not present

## 2023-09-30 DIAGNOSIS — Z1152 Encounter for screening for COVID-19: Secondary | ICD-10-CM

## 2023-09-30 DIAGNOSIS — K2100 Gastro-esophageal reflux disease with esophagitis, without bleeding: Secondary | ICD-10-CM | POA: Diagnosis present

## 2023-09-30 DIAGNOSIS — K831 Obstruction of bile duct: Secondary | ICD-10-CM | POA: Diagnosis not present

## 2023-09-30 DIAGNOSIS — S27819A Unspecified injury of esophagus (thoracic part), initial encounter: Secondary | ICD-10-CM | POA: Diagnosis not present

## 2023-09-30 DIAGNOSIS — Z539 Procedure and treatment not carried out, unspecified reason: Secondary | ICD-10-CM | POA: Diagnosis not present

## 2023-09-30 DIAGNOSIS — R509 Fever, unspecified: Secondary | ICD-10-CM | POA: Diagnosis not present

## 2023-09-30 DIAGNOSIS — Z87891 Personal history of nicotine dependence: Secondary | ICD-10-CM

## 2023-09-30 DIAGNOSIS — R748 Abnormal levels of other serum enzymes: Secondary | ICD-10-CM | POA: Diagnosis not present

## 2023-09-30 DIAGNOSIS — K5750 Diverticulosis of both small and large intestine without perforation or abscess without bleeding: Secondary | ICD-10-CM | POA: Diagnosis not present

## 2023-09-30 DIAGNOSIS — R112 Nausea with vomiting, unspecified: Principal | ICD-10-CM

## 2023-09-30 DIAGNOSIS — Z9049 Acquired absence of other specified parts of digestive tract: Secondary | ICD-10-CM | POA: Diagnosis not present

## 2023-09-30 DIAGNOSIS — R945 Abnormal results of liver function studies: Secondary | ICD-10-CM | POA: Diagnosis not present

## 2023-09-30 DIAGNOSIS — S3633XA Laceration of stomach, initial encounter: Secondary | ICD-10-CM | POA: Diagnosis not present

## 2023-09-30 DIAGNOSIS — K8050 Calculus of bile duct without cholangitis or cholecystitis without obstruction: Secondary | ICD-10-CM | POA: Diagnosis not present

## 2023-09-30 DIAGNOSIS — R599 Enlarged lymph nodes, unspecified: Secondary | ICD-10-CM | POA: Diagnosis not present

## 2023-09-30 DIAGNOSIS — K8000 Calculus of gallbladder with acute cholecystitis without obstruction: Secondary | ICD-10-CM | POA: Diagnosis not present

## 2023-09-30 DIAGNOSIS — K838 Other specified diseases of biliary tract: Secondary | ICD-10-CM

## 2023-09-30 DIAGNOSIS — E876 Hypokalemia: Secondary | ICD-10-CM | POA: Diagnosis present

## 2023-09-30 DIAGNOSIS — Z79899 Other long term (current) drug therapy: Secondary | ICD-10-CM

## 2023-09-30 DIAGNOSIS — I10 Essential (primary) hypertension: Secondary | ICD-10-CM | POA: Diagnosis present

## 2023-09-30 DIAGNOSIS — S27818A Other injury of esophagus (thoracic part), initial encounter: Secondary | ICD-10-CM | POA: Diagnosis not present

## 2023-09-30 DIAGNOSIS — X58XXXA Exposure to other specified factors, initial encounter: Secondary | ICD-10-CM | POA: Diagnosis not present

## 2023-09-30 DIAGNOSIS — N281 Cyst of kidney, acquired: Secondary | ICD-10-CM | POA: Diagnosis not present

## 2023-09-30 DIAGNOSIS — K8067 Calculus of gallbladder and bile duct with acute and chronic cholecystitis with obstruction: Secondary | ICD-10-CM | POA: Diagnosis not present

## 2023-09-30 DIAGNOSIS — Z7982 Long term (current) use of aspirin: Secondary | ICD-10-CM | POA: Diagnosis not present

## 2023-09-30 DIAGNOSIS — K8020 Calculus of gallbladder without cholecystitis without obstruction: Secondary | ICD-10-CM | POA: Diagnosis not present

## 2023-09-30 DIAGNOSIS — R000 Tachycardia, unspecified: Secondary | ICD-10-CM | POA: Diagnosis not present

## 2023-09-30 DIAGNOSIS — R059 Cough, unspecified: Secondary | ICD-10-CM | POA: Diagnosis not present

## 2023-09-30 DIAGNOSIS — R932 Abnormal findings on diagnostic imaging of liver and biliary tract: Secondary | ICD-10-CM | POA: Diagnosis not present

## 2023-09-30 DIAGNOSIS — K5710 Diverticulosis of small intestine without perforation or abscess without bleeding: Secondary | ICD-10-CM | POA: Diagnosis not present

## 2023-09-30 DIAGNOSIS — R198 Other specified symptoms and signs involving the digestive system and abdomen: Secondary | ICD-10-CM | POA: Diagnosis not present

## 2023-09-30 DIAGNOSIS — R933 Abnormal findings on diagnostic imaging of other parts of digestive tract: Secondary | ICD-10-CM | POA: Diagnosis not present

## 2023-09-30 DIAGNOSIS — S3639XA Other injury of stomach, initial encounter: Secondary | ICD-10-CM | POA: Diagnosis not present

## 2023-09-30 DIAGNOSIS — K828 Other specified diseases of gallbladder: Secondary | ICD-10-CM | POA: Diagnosis not present

## 2023-09-30 HISTORY — DX: Unspecified viral hepatitis C without hepatic coma: B19.20

## 2023-09-30 HISTORY — DX: Essential (primary) hypertension: I10

## 2023-09-30 LAB — COMPREHENSIVE METABOLIC PANEL
ALT: 281 U/L — ABNORMAL HIGH (ref 0–44)
AST: 228 U/L — ABNORMAL HIGH (ref 15–41)
Albumin: 3.3 g/dL — ABNORMAL LOW (ref 3.5–5.0)
Alkaline Phosphatase: 292 U/L — ABNORMAL HIGH (ref 38–126)
Anion gap: 11 (ref 5–15)
BUN: 19 mg/dL (ref 8–23)
CO2: 25 mmol/L (ref 22–32)
Calcium: 8.9 mg/dL (ref 8.9–10.3)
Chloride: 102 mmol/L (ref 98–111)
Creatinine, Ser: 1.12 mg/dL (ref 0.61–1.24)
GFR, Estimated: >60 mL/min (ref >60)
Glucose, Bld: 137 mg/dL — ABNORMAL HIGH (ref 70–99)
Potassium: 3.1 mmol/L — ABNORMAL LOW (ref 3.5–5.1)
Sodium: 138 mmol/L (ref 135–145)
Total Bilirubin: 6.6 mg/dL — ABNORMAL HIGH (ref 0.0–1.2)
Total Protein: 7.1 g/dL (ref 6.5–8.1)

## 2023-09-30 LAB — CBC
HCT: 40 % (ref 39.0–52.0)
Hemoglobin: 12.6 g/dL — ABNORMAL LOW (ref 13.0–17.0)
MCH: 28 pg (ref 26.0–34.0)
MCHC: 31.5 g/dL (ref 30.0–36.0)
MCV: 88.9 fL (ref 80.0–100.0)
Platelets: 261 10*3/uL (ref 150–400)
RBC: 4.5 MIL/uL (ref 4.22–5.81)
RDW: 13.6 % (ref 11.5–15.5)
WBC: 11.7 10*3/uL — ABNORMAL HIGH (ref 4.0–10.5)
nRBC: 0 % (ref 0.0–0.2)

## 2023-09-30 LAB — URINALYSIS, ROUTINE W REFLEX MICROSCOPIC
Glucose, UA: NEGATIVE mg/dL
Hgb urine dipstick: NEGATIVE
Ketones, ur: NEGATIVE mg/dL
Leukocytes,Ua: NEGATIVE
Nitrite: NEGATIVE
Protein, ur: NEGATIVE mg/dL
Specific Gravity, Urine: 1.015 (ref 1.005–1.030)
pH: 6 (ref 5.0–8.0)

## 2023-09-30 LAB — MAGNESIUM: Magnesium: 2 mg/dL (ref 1.7–2.4)

## 2023-09-30 LAB — HEPATITIS PANEL, ACUTE
HCV Ab: REACTIVE — AB
Hep A IgM: NONREACTIVE
Hep B C IgM: NONREACTIVE
Hepatitis B Surface Ag: NONREACTIVE

## 2023-09-30 LAB — RESP PANEL BY RT-PCR (RSV, FLU A&B, COVID)  RVPGX2
Influenza A by PCR: NEGATIVE
Influenza B by PCR: NEGATIVE
Resp Syncytial Virus by PCR: NEGATIVE
SARS Coronavirus 2 by RT PCR: NEGATIVE

## 2023-09-30 LAB — LIPASE, BLOOD: Lipase: 20 U/L (ref 11–51)

## 2023-09-30 LAB — LACTIC ACID, PLASMA: Lactic Acid, Venous: 1.4 mmol/L (ref 0.5–1.9)

## 2023-09-30 MED ORDER — SODIUM CHLORIDE 0.9 % IV BOLUS
1000.0000 mL | Freq: Once | INTRAVENOUS | Status: AC
  Administered 2023-09-30: 1000 mL via INTRAVENOUS

## 2023-09-30 MED ORDER — IBUPROFEN 400 MG PO TABS
400.0000 mg | ORAL_TABLET | Freq: Four times a day (QID) | ORAL | Status: DC | PRN
  Administered 2023-10-03 – 2023-10-04 (×3): 400 mg via ORAL
  Filled 2023-09-30 (×3): qty 1

## 2023-09-30 MED ORDER — SODIUM CHLORIDE 0.9 % IV SOLN: INTRAVENOUS | Status: AC

## 2023-09-30 MED ORDER — ALBUTEROL SULFATE (2.5 MG/3ML) 0.083% IN NEBU: 2.5000 mg | INHALATION_SOLUTION | RESPIRATORY_TRACT | Status: DC | PRN

## 2023-09-30 MED ORDER — IOHEXOL 300 MG/ML  SOLN
100.0000 mL | Freq: Once | INTRAMUSCULAR | Status: AC | PRN
  Administered 2023-09-30: 100 mL via INTRAVENOUS

## 2023-09-30 MED ORDER — ONDANSETRON HCL 4 MG/2ML IJ SOLN: 4.0000 mg | Freq: Four times a day (QID) | INTRAMUSCULAR | Status: DC | PRN

## 2023-09-30 MED ORDER — POTASSIUM CHLORIDE CRYS ER 20 MEQ PO TBCR
40.0000 meq | EXTENDED_RELEASE_TABLET | Freq: Once | ORAL | Status: AC
  Administered 2023-09-30: 40 meq via ORAL
  Filled 2023-09-30: qty 2

## 2023-09-30 MED ORDER — ONDANSETRON HCL 4 MG PO TABS: 4.0000 mg | ORAL_TABLET | Freq: Four times a day (QID) | ORAL | Status: DC | PRN

## 2023-09-30 MED ORDER — PANTOPRAZOLE SODIUM 20 MG PO TBEC
20.0000 mg | DELAYED_RELEASE_TABLET | Freq: Every day | ORAL | Status: DC
  Administered 2023-10-01: 20 mg via ORAL
  Filled 2023-09-30 (×2): qty 1

## 2023-09-30 MED ORDER — ONDANSETRON HCL 4 MG/2ML IJ SOLN
4.0000 mg | Freq: Once | INTRAMUSCULAR | Status: AC
  Administered 2023-09-30: 4 mg via INTRAVENOUS
  Filled 2023-09-30: qty 2

## 2023-09-30 MED ORDER — TRAZODONE HCL 50 MG PO TABS
25.0000 mg | ORAL_TABLET | Freq: Every evening | ORAL | Status: DC | PRN
  Filled 2023-09-30 (×2): qty 1

## 2023-09-30 MED ORDER — SODIUM CHLORIDE (PF) 0.9 % IJ SOLN
INTRAMUSCULAR | Status: AC
  Filled 2023-09-30: qty 50

## 2023-09-30 MED ORDER — HEPARIN SODIUM (PORCINE) 5000 UNIT/ML IJ SOLN
5000.0000 [IU] | Freq: Three times a day (TID) | INTRAMUSCULAR | Status: DC
  Administered 2023-09-30 – 2023-10-01 (×3): 5000 [IU] via SUBCUTANEOUS
  Filled 2023-09-30 (×3): qty 1

## 2023-09-30 NOTE — ED Provider Notes (Signed)
 Handoff from [facility], pending labs. Patient c/o N/V x1week.  Physical Exam  BP 107/61 (BP Location: Left Arm)   Pulse 98   Temp 99.7 F (37.6 C) (Oral)   Resp 18   SpO2 95%   Physical Exam Vitals and nursing note reviewed.  Constitutional:      General: He is not in acute distress.    Appearance: He is well-developed.  HENT:     Head: Normocephalic and atraumatic.  Eyes:     Conjunctiva/sclera: Conjunctivae normal.  Cardiovascular:     Rate and Rhythm: Normal rate and regular rhythm.     Heart sounds: No murmur heard. Pulmonary:     Effort: Pulmonary effort is normal. No respiratory distress.     Breath sounds: Normal breath sounds.  Abdominal:     Palpations: Abdomen is soft.     Tenderness: There is no abdominal tenderness.  Musculoskeletal:        General: No swelling.     Cervical back: Neck supple.  Skin:    General: Skin is warm and dry.     Capillary Refill: Capillary refill takes less than 2 seconds.  Neurological:     Mental Status: He is alert.  Psychiatric:        Mood and Affect: Mood normal.     Procedures  Procedures  ED Course / MDM    Medical Decision Making Patient is a 78 year old male, here for nausea/vomiting this been going on for a week.  He is overall well-appearing, his bilirubin and LFTs are significantly elevated, in the 200s, thus I spoke with Dr. Anitra Lauth, and we have decided to obtain a CT abdomen pelvis.  CT abdomen pelvis shows extrahepatic and intrahepatic ductal dilation, but no evidence of any kind of stone, or traction.  I spoke with Dr. Elnoria Howard, GI, and he states he will evaluate, may need ERCP versus MRCP.  Admitted to Dr. Kirby Crigler, for admission.  Patient currently afebrile, and well-appearing.  Dr. Kirby Crigler to further manage  Amount and/or Complexity of Data Reviewed Labs: ordered. Radiology: ordered.  Risk Prescription drug management. Decision regarding hospitalization.          Pete Pelt, [place] 09/30/23  [id number]    Gwyneth Sprout, MD 10/01/23 [phone]

## 2023-09-30 NOTE — Consult Note (Signed)
 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  Six days ago the patient reported the onset of persistent nausea and vomiting one hour after eating ribs.  His symptoms persisted over this week without any abdominal pain.  The patient thought he had a viral infection, but his wife brought him to the ER.  Blood work in the ER was as follows:  AST 228, ALT 281, AP 292, and TB 6.6.  There was mild elevation of his WBC at 11.7.  Routine RUQ U/S with Transplant Hepatology show that his CBD was at 3 mm in 04/2023.  An MRI performed on 04/26/2018 to evaluated a hepatic lesion only showed hepatic cysts and no biliary ductal dilation.  There was no radiographic evidence of cirrhosis.  Since admission, with IV hydration, the patient reports feeling well.  The CT scan showed new intra and extrahepatic biliary ductal dilation without mass or stone.  There was a mural nodule noted in the wall of the gallbladder.  Past Medical History:  Diagnosis Date   Hepatitis C    Hypertension     History reviewed. No pertinent surgical history.  History reviewed. No pertinent family history.  Social History:  reports that he has quit smoking. His smoking use included cigarettes. He has been exposed to tobacco smoke. He has never used smokeless tobacco. No history on file for alcohol use and drug use.  Allergies: No Known Allergies  Medications: Scheduled:  heparin  5,000 Units Subcutaneous Q8H   [START ON 10/01/2023] pantoprazole  20 mg Oral Daily   Continuous:  sodium chloride 50 mL/hr at 09/30/23 1335    Results for orders placed or performed during the hospital encounter of 09/30/23 (from the past 24 hours)  Urinalysis, Routine w reflex microscopic -Urine, Clean Catch      Status: Abnormal   Collection Time: 09/30/23  5:24 AM  Result Value Ref Range   Color, Urine AMBER (A) YELLOW   APPearance CLEAR CLEAR   Specific Gravity, Urine 1.015 1.005 - 1.030   pH 6.0 5.0 - 8.0   Glucose, UA NEGATIVE NEGATIVE mg/dL   Hgb urine dipstick NEGATIVE NEGATIVE   Bilirubin Urine SMALL (A) NEGATIVE   Ketones, ur NEGATIVE NEGATIVE mg/dL   Protein, ur NEGATIVE NEGATIVE mg/dL   Nitrite NEGATIVE NEGATIVE   Leukocytes,Ua NEGATIVE NEGATIVE  Resp panel by RT-PCR (RSV, Flu A&B, Covid) Anterior Nasal Swab     Status: None   Collection Time: 09/30/23  6:13 AM   Specimen: Anterior Nasal Swab  Result Value Ref Range   SARS Coronavirus 2 by RT PCR NEGATIVE NEGATIVE   Influenza A by PCR NEGATIVE NEGATIVE   Influenza B by PCR NEGATIVE NEGATIVE   Resp Syncytial Virus by PCR NEGATIVE NEGATIVE  Lipase, blood     Status: None   Collection Time: 09/30/23  6:17 AM  Result Value Ref Range   Lipase 20 11 - 51 U/L  Comprehensive metabolic panel     Status: Abnormal   Collection Time: 09/30/23  6:17 AM  Result Value Ref Range   Sodium 138 135 - 145 mmol/L   Potassium 3.1 (L) 3.5 - 5.1 mmol/L   Chloride 102 98 - 111 mmol/L   CO2 25 22 - 32 mmol/L   Glucose, Bld 137 (H)  70 - 99 mg/dL   BUN 19 8 - 23 mg/dL   Creatinine, Ser 8.41 0.61 - 1.24 mg/dL   Calcium 8.9 8.9 - 32.4 mg/dL   Total Protein 7.1 6.5 - 8.1 g/dL   Albumin 3.3 (L) 3.5 - 5.0 g/dL   AST 401 (H) 15 - 41 U/L   ALT 281 (H) 0 - 44 U/L   Alkaline Phosphatase 292 (H) 38 - 126 U/L   Total Bilirubin 6.6 (H) 0.0 - 1.2 mg/dL   GFR, Estimated >02 >72 mL/min   Anion gap 11 5 - 15  CBC     Status: Abnormal   Collection Time: 09/30/23  6:17 AM  Result Value Ref Range   WBC 11.7 (H) 4.0 - 10.5 K/uL   RBC 4.50 4.22 - 5.81 MIL/uL   Hemoglobin 12.6 (L) 13.0 - 17.0 g/dL   HCT 53.6 64.4 - 03.4 %   MCV 88.9 80.0 - 100.0 fL   MCH 28.0 26.0 - 34.0 pg   MCHC 31.5 30.0 - 36.0 g/dL   RDW 74.2 59.5 - 63.8 %   Platelets 261 150 - 400 K/uL    nRBC 0.0 0.0 - 0.2 %  Lactic acid, plasma     Status: None   Collection Time: 09/30/23  6:25 AM  Result Value Ref Range   Lactic Acid, Venous 1.4 0.5 - 1.9 mmol/L  Magnesium     Status: None   Collection Time: 09/30/23  1:54 PM  Result Value Ref Range   Magnesium 2.0 1.7 - 2.4 mg/dL     CT ABDOMEN PELVIS W CONTRAST Result Date: 09/30/2023 CLINICAL DATA:  Transaminitis with nausea and vomiting. EXAM: CT ABDOMEN AND PELVIS WITH CONTRAST TECHNIQUE: Multidetector CT imaging of the abdomen and pelvis was performed using the standard protocol following bolus administration of intravenous contrast. RADIATION DOSE REDUCTION: This exam was performed according to the departmental dose-optimization program which includes automated exposure control, adjustment of the mA and/or kV according to patient size and/or use of iterative reconstruction technique. CONTRAST:  OMNIPAQUE IOHEXOL 300 MG/ML  SOLN COMPARISON:  Abdominal ultrasound 05/25/2023. MRI abdomen 04/26/2018 FINDINGS: Lower chest: Chronic interstitial changes noted in the lung bases. Hepatobiliary: Scattered tiny hypodensities in the liver parenchyma are too small to characterize but are statistically most likely benign. No followup imaging is recommended. No suspicious enhancing liver lesion. Intrahepatic biliary duct dilatation is new since previous MRI. Common bile duct in the head of the pancreas measures up to 9 mm diameter with relatively abrupt tapering that appears to occur just proximal to the ampulla (axial 35/2 and well seen coronal 70/7). No calcified stones in the gallbladder or common bile duct. 11 x 9 mm apparent mural nodule is seen in the anterior wall of the gallbladder on axial 26/2 and coronal 63/7. This appears to be a mural lesion is opposed to a mucosal abnormality such as a polyp. Pancreas: No focal mass lesion. No dilatation of the main duct. No intraparenchymal cyst. No peripancreatic edema. Spleen: No splenomegaly. No  suspicious focal mass lesion. Adrenals/Urinary Tract: No adrenal nodule or mass. 7.1 cm simple cyst noted upper pole right kidney increased from 5.5 cm on previous MRI. No followup imaging is recommended. Kidneys otherwise unremarkable. No evidence for hydroureter. The urinary bladder appears normal for the degree of distention. Stomach/Bowel: Stomach is unremarkable. No gastric wall thickening. No evidence of outlet obstruction. Duodenum is normally positioned as is the ligament of Treitz. Duodenal diverticulum noted. No small bowel wall thickening.  No small bowel dilatation. The terminal ileum is normal. The appendix is normal. No gross colonic mass. No colonic wall thickening. Diverticuli are seen scattered along the entire length of the colon without CT findings of diverticulitis. Vascular/Lymphatic: There is mild atherosclerotic calcification of the abdominal aorta without aneurysm. Borderline enlarged lymph nodes are seen in the hepatoduodenal ligament, likely reactive. No retroperitoneal lymphadenopathy. No pelvic sidewall lymphadenopathy. Reproductive: The prostate gland and seminal vesicles are unremarkable. Other: No intraperitoneal free fluid. Musculoskeletal: Small bilateral groin hernias, left greater than right contain only fat. Small fat containing umbilical hernia evident. No worrisome lytic or sclerotic osseous abnormality. IMPRESSION: 1. Intra and extrahepatic biliary duct dilatation appears new since previous MRI from 2019. These findings are also new since a chest CT of 08/10/2021 that included the liver and common bile duct. Common bile duct tapers relatively abruptly in the head of the pancreas just proximal to the ampulla without obstructing mass lesion or stone evident by CT imaging. MRCP may provide additional characterization, but ultimately, ERCP may be warranted as benign stricture could have this appearance. 2. 11 x 9 mm mural nodule mid gallbladder. This appears to be mural based rather  than a mucosal lesion such as polyp. MRI abdomen with and without contrast may prove helpful to further evaluate. 3. Diffuse colonic diverticulosis without diverticulitis. 4.  Aortic Atherosclerois (ICD10-170.0) Electronically Signed   By: Kennith Center M.D.   On: 09/30/2023 09:41   DG Chest 2 View Result Date: 09/30/2023 CLINICAL DATA:  Cough.  Fever.  Body aches. EXAM: CHEST - 2 VIEW COMPARISON:  07/01/2021 FINDINGS: The lungs are clear without focal pneumonia, edema, pneumothorax or pleural effusion. Scattered areas of architectural distortion/scarring are noted bilaterally, better characterized on recent chest CT of 06/21/2023. No suspicious pulmonary nodule or mass. No acute bony abnormality. Telemetry leads overlie the chest. IMPRESSION: No acute cardiopulmonary disease. Electronically Signed   By: Kennith Center M.D.   On: 09/30/2023 06:12    ROS:  As stated above in the HPI otherwise negative.  Blood pressure 116/67, pulse (!) 105, temperature 99.2 F (37.3 C), temperature source Oral, resp. rate 18, height 6\' 1"  (1.854 m), weight 88.6 kg, SpO2 93%.    PE: Gen: NAD, Alert and Oriented HEENT:  [place]/AT, EOMI Neck: Supple, no LAD Lungs: CTA Bilaterally CV: RRR without M/G/R ABD: Soft, NTND, +BS Ext: No C/C/E  Assessment/Plan: 1) Elevated liver enzymes. 2) Nausea/vomiting - improved. 3) F3-F4 fibrosis secondary to HCV s/p treatment.   Clinically the patient is well.  His presentation is suspicious for choledocholithiasis.  The CT scan did not see any evidence of stones and an ultrasound on 05/25/2023 was negative for stones in the gallbladder.  All of his symptoms started 6 days ago.  The patient feels much better now and it may be that he passed a stone or a conglomeration of sludge.    Plan: 1) Await MRCP. 2) Follow liver panel. 3) Continue with IV hydration. 4) [facility] will assume care in the morning as he is follow by [facility] primary.  Oscar Forman D 09/30/2023, 3:54 PM

## 2023-09-30 NOTE — ED Triage Notes (Signed)
 Pt states that he has been having generalized abd pain, n/v and fevers at home for the past week

## 2023-09-30 NOTE — Plan of Care (Signed)
   Problem: Education: Goal: Knowledge of General Education information will improve Description: Including pain rating scale, medication(s)/side effects and non-pharmacologic comfort measures Outcome: Progressing   Problem: Clinical Measurements: Goal: Will remain free from infection Outcome: Progressing   Problem: Activity: Goal: Risk for activity intolerance will decrease Outcome: Progressing

## 2023-09-30 NOTE — ED Provider Notes (Signed)
 [place] EMER[facility]ENC[facility] DEPARTMENT AT [facility] Provider Note   CSN: [id number] Arrival date & time: 09/30/23  7846     History  Chief Complaint  Patient presents with   Emesis    Andrew Douglas is a 78 y.o. male.  Patient presents to the emergency department complaining of approximately 1 week of generalized abdominal pain with nausea, vomiting, and subjective fevers.  Patient also complains of a cough.  He states that he is unable to keep down any solid foods.  He has been trying soups and other mild foods without success.  He states he has been able to keep down some small amounts of fluid.  He denies diarrhea and endorses his most recent bowel movement is being earlier this evening when he arrived to the hospital.  Patient denies chest pain, shortness of breath, urinary symptoms.   Emesis      Home Medications Prior to Admission medications   Medication Sig Start Date End Date Taking? Authorizing Provider  aspirin EC 81 MG tablet Take 81 mg by mouth daily. Swallow whole.    [provider]  benzonatate (TESSALON) 200 MG capsule TAKE 1 CAPSULE BY MOUTH 2 TIMES DAILY AS NEEDED FOR COUGH 07/04/23   Mannam, Praveen, MD  dorzolamide-timolol (COSOPT) 2-0.5 % ophthalmic solution 1 drop 2 (two) times daily.    [provider]  losartan-hydrochlorothiazide (HYZAAR) 100-12.5 MG tablet Take 1 tablet by mouth daily.    [provider]  pantoprazole (PROTONIX) 20 MG tablet Take 1 tablet (20 mg total) by mouth daily. 06/16/22   Chilton Greathouse, MD      Allergies    Patient has no known allergies.    Review of Systems   Review of Systems  Gastrointestinal:  Positive for vomiting.    Physical Exam Updated Vital Signs BP 119/68   Pulse (!) 124   Temp 99.4 F (37.4 C) (Oral)   Resp 18   SpO2 93%  Physical Exam Vitals and nursing note reviewed.  Constitutional:      General: He is not in acute distress.    Appearance: He is well-developed.   HENT:     Head: Normocephalic and atraumatic.  Eyes:     Conjunctiva/sclera: Conjunctivae normal.  Cardiovascular:     Rate and Rhythm: Regular rhythm. Tachycardia present.  Pulmonary:     Effort: Pulmonary effort is normal. No respiratory distress.     Breath sounds: Normal breath sounds.  Abdominal:     Palpations: Abdomen is soft.     Tenderness: There is no abdominal tenderness.  Musculoskeletal:        General: No swelling.     Cervical back: Neck supple.  Skin:    General: Skin is warm and dry.     Capillary Refill: Capillary refill takes less than 2 seconds.  Neurological:     Mental Status: He is alert.  Psychiatric:        Mood and Affect: Mood normal.     ED Results / Procedures / Treatments   Labs (all labs ordered are listed, but only abnormal results are displayed) Labs Reviewed  URINALYSIS, ROUTINE W REFLEX MICROSCOPIC - Abnormal; Notable for the following components:      Result Value   Color, Urine AMBER (*)    Bilirubin Urine SMALL (*)    All other components within normal limits  RESP PANEL BY RT-PCR (RSV, FLU A&B, COVID)  [id number]  LIPASE, BLOOD  COMPREHENSIVE METABOLIC PANEL  CBC  I-STAT  CG4 LACTIC ACID, ED    EKG EKG Interpretation Date/Time:  Friday September 30 2023 05:30:14 EDT Ventricular Rate:  107 PR Interval:  169 QRS Duration:  90 QT Interval:  323 QTC Calculation: 433 R Axis:   16  Text Interpretation: Sinus tachycardia Abnormal R-wave progression, early transition Baseline wander in lead(s) II III aVF V5 V6 When compared with ECG of 08/26/2006, No significant change was found Confirmed by Dione Booze ([id number]) on 09/30/2023 5:39:17 AM  Radiology DG Chest 2 View Result Date: 09/30/2023 CLINICAL DATA:  Cough.  Fever.  Body aches. EXAM: CHEST - 2 VIEW COMPARISON:  07/01/2021 FINDINGS: The lungs are clear without focal pneumonia, edema, pneumothorax or pleural effusion. Scattered areas of architectural distortion/scarring are noted  bilaterally, better characterized on recent chest CT of 06/21/2023. No suspicious pulmonary nodule or mass. No acute bony abnormality. Telemetry leads overlie the chest. IMPRESSION: No acute cardiopulmonary disease. Electronically Signed   By: Kennith Center M.D.   On: 09/30/2023 06:12    Procedures Procedures    Medications Ordered in ED Medications  sodium chloride 0.9 % bolus 1,000 mL (has no administration in time range)  ondansetron (ZOFRAN) injection 4 mg (has no administration in time range)    ED Course/ Medical Decision Making/ A&P                                 Medical Decision Making Amount and/or Complexity of Data Reviewed Labs: ordered. Radiology: ordered.  Risk Prescription drug management.   This patient presents to the ED for concern of abdominal pain with nausea, vomiting, this involves an extensive number of treatment options, and is a complaint that carries with it a high risk of complications and morbidity.  The differential diagnosis includes pancreatitis, gastritis, gastroenteritis, cholecystitis, others   Co morbidities that complicate the patient evaluation  Hepatitis C, hypertension   Additional history obtained:  Additional history obtained from family at bedside   Lab Tests:  I Ordered, and personally interpreted labs.  The pertinent results include:  UA with small bilirubin, other labs pending   Imaging Studies ordered:  I ordered imaging studies including chest x-ray I independently visualized and interpreted imaging which showed no acute disease I agree with the radiologist interpretation   Cardiac Monitoring: / EKG:  The patient was maintained on a cardiac monitor.  I personally viewed and interpreted the cardiac monitored which showed an underlying rhythm of: Sinus tachycardia  Problem List / ED Course / Critical interventions / Medication management   I ordered medication including saline bolus for fluid resuscitation, Zofran  for nausea   Social Determinants of Health:  Patient is a former smoker   Test / Admission - Considered:  Patient care being transferred to [employer], PA-C at shift change.  Disposition pending completed workup, reassessment, PO challenge.          Final Clinical Impression(s) / ED Diagnoses Final diagnoses:  Nausea and vomiting, unspecified vomiting type    Rx / DC Orders ED Discharge Orders     None         Pamala Duffel 09/30/23 6045    Dione Booze, MD 09/30/23 [phone]

## 2023-09-30 NOTE — H&P (Signed)
 History and Physical  Andrew Douglas DGL:[id number] DOB: Oct 05, 1945 DOA: 09/30/2023  PCP: Elias Else, MD   Chief Complaint: Vomiting  HPI: Andrew Douglas is a 78 y.o. male with medical history significant for hypertension, treated hepatitis C admitted to the hospital with biliary obstruction.  History is provided by the patient, corroborated by his wife who is at the bedside.  They state that he started having intractable nausea and vomiting without pain about 6 days ago.  He has had some subjective fevers, chills.  No diarrhea, no sick contacts.  No blood in his stool or emesis.  His urine has become dark.  Review of Systems: Please see HPI for pertinent positives and negatives. A complete 10 system review of systems are otherwise negative.  Past Medical History:  Diagnosis Date   Hepatitis C    Hypertension    History reviewed. No pertinent surgical history. Social History:  reports that he has quit smoking. His smoking use included cigarettes. He has been exposed to tobacco smoke. He has never used smokeless tobacco. No history on file for alcohol use and drug use.  No Known Allergies  History reviewed. No pertinent family history.   Prior to Admission medications   Medication Sig Start Date End Date Taking? Authorizing Provider  aspirin EC 81 MG tablet Take 81 mg by mouth daily. Swallow whole.    [provider]  benzonatate (TESSALON) 200 MG capsule TAKE 1 CAPSULE BY MOUTH 2 TIMES DAILY AS NEEDED FOR COUGH 07/04/23   Mannam, Praveen, MD  dorzolamide-timolol (COSOPT) 2-0.5 % ophthalmic solution 1 drop 2 (two) times daily.    [provider]  losartan-hydrochlorothiazide (HYZAAR) 100-12.5 MG tablet Take 1 tablet by mouth daily.    [provider]  pantoprazole (PROTONIX) 20 MG tablet Take 1 tablet (20 mg total) by mouth daily. 06/16/22   Chilton Greathouse, MD    Physical Exam: BP 116/74 (BP Location: Left Arm)   Pulse (!) 102   Temp 99.2 F (37.3  C) (Oral)   Resp 18   SpO2 96%  General:  Alert, oriented, calm, in no acute distress  Eyes: EOMI, clear conjuctivae, white sclerea Neck: supple, no masses, trachea mildline  Cardiovascular: RRR, no murmurs or rubs, no peripheral edema  Respiratory: clear to auscultation bilaterally, no wheezes, no crackles  Abdomen: soft, nontender, nondistended, normal bowel tones heard  Skin: dry, no rashes  Musculoskeletal: no joint effusions, normal range of motion  Psychiatric: appropriate affect, normal speech  Neurologic: extraocular muscles intact, clear speech, moving all extremities with intact sensorium         Labs on Admission:  Basic Metabolic Panel: Recent Labs  Lab 09/30/23 0617  NA 138  K 3.1*  CL 102  CO2 25  GLUCOSE 137*  BUN 19  CREATININE 1.12  CALCIUM 8.9   Liver Function Tests: Recent Labs  Lab 09/30/23 0617  AST 228*  ALT 281*  ALKPHOS 292*  BILITOT 6.6*  PROT 7.1  ALBUMIN 3.3*   Recent Labs  Lab 09/30/23 0617  LIPASE 20   No results for input(s): "AMMONIA" in the last 168 hours. CBC: Recent Labs  Lab 09/30/23 0617  WBC 11.7*  HGB 12.6*  HCT 40.0  MCV 88.9  PLT 261   Cardiac Enzymes: No results for input(s): "CKTOTAL", "CKMB", "CKMBINDEX", "TROPONINI" in the last 168 hours. BNP (last 3 results) No results for input(s): "BNP" in the last 8760 hours.  ProBNP (last 3 results) No results for input(s): "PROBNP" in the  last 8760 hours.  CBG: No results for input(s): "GLUCAP" in the last 168 hours.  Radiological Exams on Admission: CT ABDOMEN PELVIS W CONTRAST Result Date: 09/30/2023 CLINICAL DATA:  Transaminitis with nausea and vomiting. EXAM: CT ABDOMEN AND PELVIS WITH CONTRAST TECHNIQUE: Multidetector CT imaging of the abdomen and pelvis was performed using the standard protocol following bolus administration of intravenous contrast. RADIATION DOSE REDUCTION: This exam was performed according to the departmental dose-optimization program which  includes automated exposure control, adjustment of the mA and/or kV according to patient size and/or use of iterative reconstruction technique. CONTRAST:  OMNIPAQUE IOHEXOL 300 MG/ML  SOLN COMPARISON:  Abdominal ultrasound 05/25/2023. MRI abdomen 04/26/2018 FINDINGS: Lower chest: Chronic interstitial changes noted in the lung bases. Hepatobiliary: Scattered tiny hypodensities in the liver parenchyma are too small to characterize but are statistically most likely benign. No followup imaging is recommended. No suspicious enhancing liver lesion. Intrahepatic biliary duct dilatation is new since previous MRI. Common bile duct in the head of the pancreas measures up to 9 mm diameter with relatively abrupt tapering that appears to occur just proximal to the ampulla (axial 35/2 and well seen coronal 70/7). No calcified stones in the gallbladder or common bile duct. 11 x 9 mm apparent mural nodule is seen in the anterior wall of the gallbladder on axial 26/2 and coronal 63/7. This appears to be a mural lesion is opposed to a mucosal abnormality such as a polyp. Pancreas: No focal mass lesion. No dilatation of the main duct. No intraparenchymal cyst. No peripancreatic edema. Spleen: No splenomegaly. No suspicious focal mass lesion. Adrenals/Urinary Tract: No adrenal nodule or mass. 7.1 cm simple cyst noted upper pole right kidney increased from 5.5 cm on previous MRI. No followup imaging is recommended. Kidneys otherwise unremarkable. No evidence for hydroureter. The urinary bladder appears normal for the degree of distention. Stomach/Bowel: Stomach is unremarkable. No gastric wall thickening. No evidence of outlet obstruction. Duodenum is normally positioned as is the ligament of Treitz. Duodenal diverticulum noted. No small bowel wall thickening. No small bowel dilatation. The terminal ileum is normal. The appendix is normal. No gross colonic mass. No colonic wall thickening. Diverticuli are seen scattered along the  entire length of the colon without CT findings of diverticulitis. Vascular/Lymphatic: There is mild atherosclerotic calcification of the abdominal aorta without aneurysm. Borderline enlarged lymph nodes are seen in the hepatoduodenal ligament, likely reactive. No retroperitoneal lymphadenopathy. No pelvic sidewall lymphadenopathy. Reproductive: The prostate gland and seminal vesicles are unremarkable. Other: No intraperitoneal free fluid. Musculoskeletal: Small bilateral groin hernias, left greater than right contain only fat. Small fat containing umbilical hernia evident. No worrisome lytic or sclerotic osseous abnormality. IMPRESSION: 1. Intra and extrahepatic biliary duct dilatation appears new since previous MRI from 2019. These findings are also new since a chest CT of 08/10/2021 that included the liver and common bile duct. Common bile duct tapers relatively abruptly in the head of the pancreas just proximal to the ampulla without obstructing mass lesion or stone evident by CT imaging. MRCP may provide additional characterization, but ultimately, ERCP may be warranted as benign stricture could have this appearance. 2. 11 x 9 mm mural nodule mid gallbladder. This appears to be mural based rather than a mucosal lesion such as polyp. MRI abdomen with and without contrast may prove helpful to further evaluate. 3. Diffuse colonic diverticulosis without diverticulitis. 4.  Aortic Atherosclerois (ICD10-170.0) Electronically Signed   By: Kennith Center M.D.   On: 09/30/2023 09:41   DG  Chest 2 View Result Date: 09/30/2023 CLINICAL DATA:  Cough.  Fever.  Body aches. EXAM: CHEST - 2 VIEW COMPARISON:  07/01/2021 FINDINGS: The lungs are clear without focal pneumonia, edema, pneumothorax or pleural effusion. Scattered areas of architectural distortion/scarring are noted bilaterally, better characterized on recent chest CT of 06/21/2023. No suspicious pulmonary nodule or mass. No acute bony abnormality. Telemetry leads  overlie the chest. IMPRESSION: No acute cardiopulmonary disease. Electronically Signed   By: Kennith Center M.D.   On: 09/30/2023 06:12   Assessment/Plan Andrew Douglas is a 78 y.o. male with medical history significant for hypertension, treated hepatitis C admitted to the hospital with biliary obstruction.  Biliary obstruction-with vomiting, abnormal LFTs, intra and extrahepatic biliary duct dilation.  Differential is broad, could be due to stricture, stone, intrahepatic mass. -Inpatient admission -Keep n.p.o. for now, with gentle IV fluids -ER provider has consulted GI, anticipate he may benefit from ERCP  Abnormal LFTs-workup as above -Avoid hepatotoxins -Check acute hepatitis panel -Trend with daily labs  Hypokalemia-likely due to GI losses, repleted orally  Hypertension-will resume home meds once reconciled  DVT prophylaxis: HepSQ     Code Status: Full Code  Consults called: Gastroenterology - Dr. Elnoria Howard  Admission status: The appropriate patient status for this patient is INPATIENT. Inpatient status is judged to be reasonable and necessary in order to provide the required intensity of service to ensure the patient's safety. The patient's presenting symptoms, physical exam findings, and initial radiographic and laboratory data in the context of their chronic comorbidities is felt to place them at high risk for further clinical deterioration. Furthermore, it is not anticipated that the patient will be medically stable for discharge from the hospital within 2 midnights of admission.    I certify that at the point of admission it is my clinical judgment that the patient will require inpatient hospital care spanning beyond 2 midnights from the point of admission due to high intensity of service, high risk for further deterioration and high frequency of surveillance required  Time spent: 55 minutes  Andrew Douglas Sharlette Dense MD [facility] Pager [phone]  If 7PM-7AM, please  contact night-coverage [email or web address] Password [facility]  09/30/2023, 10:58 AM

## 2023-10-01 ENCOUNTER — Inpatient Hospital Stay (HOSPITAL_COMMUNITY)

## 2023-10-01 DIAGNOSIS — K831 Obstruction of bile duct: Secondary | ICD-10-CM | POA: Diagnosis not present

## 2023-10-01 LAB — COMPREHENSIVE METABOLIC PANEL
ALT: 195 U/L — ABNORMAL HIGH (ref 0–44)
AST: 123 U/L — ABNORMAL HIGH (ref 15–41)
Albumin: 2.7 g/dL — ABNORMAL LOW (ref 3.5–5.0)
Alkaline Phosphatase: 246 U/L — ABNORMAL HIGH (ref 38–126)
Anion gap: 8 (ref 5–15)
BUN: 15 mg/dL (ref 8–23)
CO2: 24 mmol/L (ref 22–32)
Calcium: 8.1 mg/dL — ABNORMAL LOW (ref 8.9–10.3)
Chloride: 106 mmol/L (ref 98–111)
Creatinine, Ser: 0.82 mg/dL (ref 0.61–1.24)
GFR, Estimated: >60 mL/min (ref >60)
Glucose, Bld: 101 mg/dL — ABNORMAL HIGH (ref 70–99)
Potassium: 3.2 mmol/L — ABNORMAL LOW (ref 3.5–5.1)
Sodium: 138 mmol/L (ref 135–145)
Total Bilirubin: 7 mg/dL — ABNORMAL HIGH (ref 0.0–1.2)
Total Protein: 6.3 g/dL — ABNORMAL LOW (ref 6.5–8.1)

## 2023-10-01 LAB — PROTIME-INR
INR: 1.2 (ref 0.8–1.2)
Prothrombin Time: 15.3 s — ABNORMAL HIGH (ref 11.4–15.2)

## 2023-10-01 LAB — CBC
HCT: 33.6 % — ABNORMAL LOW (ref 39.0–52.0)
Hemoglobin: 10.9 g/dL — ABNORMAL LOW (ref 13.0–17.0)
MCH: 28.5 pg (ref 26.0–34.0)
MCHC: 32.4 g/dL (ref 30.0–36.0)
MCV: 87.7 fL (ref 80.0–100.0)
Platelets: 240 10*3/uL (ref 150–400)
RBC: 3.83 MIL/uL — ABNORMAL LOW (ref 4.22–5.81)
RDW: 13.9 % (ref 11.5–15.5)
WBC: 9.4 10*3/uL (ref 4.0–10.5)
nRBC: 0 % (ref 0.0–0.2)

## 2023-10-01 MED ORDER — SODIUM CHLORIDE 0.9 % IV SOLN
3.0000 g | Freq: Four times a day (QID) | INTRAVENOUS | Status: DC
  Administered 2023-10-01 – 2023-10-05 (×16): 3 g via INTRAVENOUS
  Filled 2023-10-01 (×20): qty 8

## 2023-10-01 MED ORDER — SODIUM CHLORIDE 0.9 % IV SOLN: INTRAVENOUS | Status: DC

## 2023-10-01 MED ORDER — GADOBUTROL 1 MMOL/ML IV SOLN
8.0000 mL | Freq: Once | INTRAVENOUS | Status: AC | PRN
  Administered 2023-10-01: 8 mL via INTRAVENOUS

## 2023-10-01 MED ORDER — MELATONIN 5 MG PO TABS
5.0000 mg | ORAL_TABLET | Freq: Once | ORAL | Status: AC
  Administered 2023-10-01: 5 mg via ORAL
  Filled 2023-10-01: qty 1

## 2023-10-01 MED ORDER — POTASSIUM CHLORIDE CRYS ER 20 MEQ PO TBCR
40.0000 meq | EXTENDED_RELEASE_TABLET | ORAL | Status: AC
  Administered 2023-10-01 (×2): 40 meq via ORAL
  Filled 2023-10-01 (×2): qty 2

## 2023-10-01 MED ORDER — SODIUM CHLORIDE 0.9 % IV SOLN
INTRAVENOUS | Status: DC
Start: 2023-10-01 — End: 2023-10-01

## 2023-10-01 NOTE — Progress Notes (Signed)
 PROGRESS NOTE    Andrew Douglas  ZOX:[id number] DOB: 09-Feb-1946 DOA: 09/30/2023 PCP: Elias Else, MD   Brief Narrative: 78 year old with past medical history significant for hypertension, treated hepatitis C presents with nausea vomiting found to have biliary obstruction.  Presented with transaminases: Alkaline phosphatase 292, AST 228, ALT 281. CT abdomen Pelvis: Intra and extrahepatic biliary duct dilatation appears new since previous MRI from 2019.   Assessment & Plan:   Principal Problem:   Biliary obstruction  1-Biliary obstruction: Transaminases, perbilirubinemia -Patient presented with nausea vomiting for 6 days prior to admission. -CT abdomen and pelvis show intra and extrahepatic biliary duct dilation new since previous MRI from 2019. Common bile duct tapers relatively abruptly in the head of the pancreas just proximal to the ampulla without obstructing mass lesion or stone evident by CT imaging. -GI consulted recommended MRCP.  -MRCP ordered: Substantial intra and extrahepatic biliary ductal dilatation, common bile duct measuring up to 1.1 cm in caliber. The duct tapers smoothly to the ampulla; there appears to be layering sludge or gravel at the ampulla without discrete calculi. No obstructing mass or other obstructing lesion identified. Mild, diffuse gallbladder wall thickening, concerning for cholecystitis. Enhancing exophytic mural nodule of the anterior medial aspect of the gallbladder wall measuring 1.5 x 0.9 cm, suspicious for gallbladder neoplasm, possibly reflecting very prominent focal adenomyomatosis. -GI planning ERCP tomorrow.  -Surgery consulted due to concern for Cholecystis on MRCP and gallbladder nodule.  Started IV Unasyn.    History of hepatitis C: Treated Hepatitis panel, hepatitis C antibody reactive  Hypokalemia:  -Replaced   Hypertension: -Continue to hold losartan and hydrochlorothiazide, systolic blood pressure soft       Estimated body  mass index is 25.77 kg/m as calculated from the following:   Height as of this encounter: 6\' 1"  (1.854 m).   Weight as of this encounter: 88.6 kg.   DVT prophylaxis: SCD Code Status: Full code Family Communication: Wife at bedside Disposition Plan:  Status is: Inpatient Remains inpatient appropriate because: management of transaminases    Consultants:  GI Surgery   Procedures:  MRCP  Antimicrobials:    Subjective: He denies abdomina pain,, no right Upper quadrant pain.  No further vomiting.   Objective: Vitals:   09/30/23 1811 09/30/23 2034 10/01/23 0331 10/01/23 0548  BP: 131/78 120/74 101/62 107/66  Pulse: 85 86 82 81  Resp:  18 18 18   Temp: 98.4 F (36.9 C) 98.8 F (37.1 C) 99.9 F (37.7 C) 99.1 F (37.3 C)  TempSrc: Oral Oral Oral Oral  SpO2: 98% 98% 97% 91%  Weight:      Height:        Intake/Output Summary (Last 24 hours) at 10/01/2023 0701 Last data filed at 10/01/2023 0606 Gross per 24 hour  Intake 1640.74 ml  Output 600 ml  Net 1040.74 ml   Filed Weights   09/30/23 1333  Weight: 88.6 kg    Examination:  General exam: Appears calm and comfortable  Respiratory system: Clear to auscultation. Respiratory effort normal. Cardiovascular system: S1 & S2 heard, RRR. Gastrointestinal system: Abdomen is nondistended, soft and nontender. No organomegaly or masses felt. Normal bowel sounds heard. Central nervous system: Alert and oriented. No focal neurological deficits. Extremities: Symmetric 5 x 5 power.  Data Reviewed: I have personally reviewed following labs and imaging studies  CBC: Recent Labs  Lab 09/30/23 0617 10/01/23 0521  WBC 11.7* 9.4  HGB 12.6* 10.9*  HCT 40.0 33.6*  MCV 88.9 87.7  PLT 261 240  Basic Metabolic Panel: Recent Labs  Lab 09/30/23 0617 09/30/23 1354 10/01/23 0521  NA 138  --  138  K 3.1*  --  3.2*  CL 102  --  106  CO2 25  --  24  GLUCOSE 137*  --  101*  BUN 19  --  15  CREATININE 1.12  --  0.82  CALCIUM  8.9  --  8.1*  MG  --  2.0  --    GFR: Estimated Creatinine Clearance: 83.9 mL/min (by C-G formula based on SCr of 0.82 mg/dL). Liver Function Tests: Recent Labs  Lab 09/30/23 0617 10/01/23 0521  AST 228* 123*  ALT 281* 195*  ALKPHOS 292* 246*  BILITOT 6.6* 7.0*  PROT 7.1 6.3*  ALBUMIN 3.3* 2.7*   Recent Labs  Lab 09/30/23 0617  LIPASE 20   No results for input(s): "AMMONIA" in the last 168 hours. Coagulation Profile: No results for input(s): "INR", "PROTIME" in the last 168 hours. Cardiac Enzymes: No results for input(s): "CKTOTAL", "CKMB", "CKMBINDEX", "TROPONINI" in the last 168 hours. BNP (last 3 results) No results for input(s): "PROBNP" in the last 8760 hours. HbA1C: No results for input(s): "HGBA1C" in the last 72 hours. CBG: No results for input(s): "GLUCAP" in the last 168 hours. Lipid Profile: No results for input(s): "CHOL", "HDL", "LDLCALC", "TRIG", "CHOLHDL", "LDLDIRECT" in the last 72 hours. Thyroid Function Tests: No results for input(s): "TSH", "T4TOTAL", "FREET4", "T3FREE", "THYROIDAB" in the last 72 hours. Anemia Panel: No results for input(s): "VITAMINB12", "FOLATE", "FERRITIN", "TIBC", "IRON", "RETICCTPCT" in the last 72 hours. Sepsis Labs: Recent Labs  Lab 09/30/23 2130  LATICACIDVEN 1.4    Recent Results (from the past 240 hours)  Resp panel by RT-PCR (RSV, Flu A&B, Covid) Anterior Nasal Swab     Status: None   Collection Time: 09/30/23  6:13 AM   Specimen: Anterior Nasal Swab  Result Value Ref Range Status   SARS Coronavirus 2 by RT PCR NEGATIVE NEGATIVE Final    Comment: (NOTE) SARS-CoV-2 target nucleic acids are NOT DETECTED.  The SARS-CoV-2 RNA is generally detectable in upper respiratory specimens during the acute phase of infection. The lowest concentration of SARS-CoV-2 viral copies this assay can detect is 138 copies/mL. A negative result does not preclude SARS-Cov-2 infection and should not be used as the sole basis for  treatment or other patient management decisions. A negative result may occur with  improper specimen collection/handling, submission of specimen other than nasopharyngeal swab, presence of viral mutation(s) within the areas targeted by this assay, and inadequate number of viral copies(<138 copies/mL). A negative result must be combined with clinical observations, patient history, and epidemiological information. The expected result is Negative.  Fact Sheet for Patients:  BloggerCourse.com  Fact Sheet for Healthcare Providers:  SeriousBroker.it  This test is no t yet approved or cleared by the [place] FDA and  has been authorized for detection and/or diagnosis of SARS-CoV-2 by FDA under an Emergency Use Authorization (EUA). This EUA will remain  in effect (meaning this test can be used) for the duration of the COVID-19 declaration under Section 564(b)(1) of the Act, 21 U.S.C.section 360bbb-3(b)(1), unless the authorization is terminated  or revoked sooner.       Influenza A by PCR NEGATIVE NEGATIVE Final   Influenza B by PCR NEGATIVE NEGATIVE Final    Comment: (NOTE) The Xpert Xpress SARS-CoV-2/FLU/RSV plus assay is intended as an aid in the diagnosis of influenza from Nasopharyngeal swab specimens and should not be used  as a sole basis for treatment. Nasal washings and aspirates are unacceptable for Xpert Xpress SARS-CoV-2/FLU/RSV testing.  Fact Sheet for Patients: BloggerCourse.com  Fact Sheet for Healthcare Providers: SeriousBroker.it  This test is not yet approved or cleared by the [place] FDA and has been authorized for detection and/or diagnosis of SARS-CoV-2 by FDA under an Emergency Use Authorization (EUA). This EUA will remain in effect (meaning this test can be used) for the duration of the COVID-19 declaration under Section 564(b)(1) of the Act, 21  U.S.C. section 360bbb-3(b)(1), unless the authorization is terminated or revoked.     Resp Syncytial Virus by PCR NEGATIVE NEGATIVE Final    Comment: (NOTE) Fact Sheet for Patients: BloggerCourse.com  Fact Sheet for Healthcare Providers: SeriousBroker.it  This test is not yet approved or cleared by the [place] FDA and has been authorized for detection and/or diagnosis of SARS-CoV-2 by FDA under an Emergency Use Authorization (EUA). This EUA will remain in effect (meaning this test can be used) for the duration of the COVID-19 declaration under Section 564(b)(1) of the Act, 21 U.S.C. section 360bbb-3(b)(1), unless the authorization is terminated or revoked.  Performed at [facility], [address] [place], [place] [address]          Radiology Studies: CT ABDOMEN PELVIS W CONTRAST Result Date: 09/30/2023 CLINICAL DATA:  Transaminitis with nausea and vomiting. EXAM: CT ABDOMEN AND PELVIS WITH CONTRAST TECHNIQUE: Multidetector CT imaging of the abdomen and pelvis was performed using the standard protocol following bolus administration of intravenous contrast. RADIATION DOSE REDUCTION: This exam was performed according to the departmental dose-optimization program which includes automated exposure control, adjustment of the mA and/or kV according to patient size and/or use of iterative reconstruction technique. CONTRAST:  OMNIPAQUE IOHEXOL 300 MG/ML  SOLN COMPARISON:  Abdominal ultrasound 05/25/2023. MRI abdomen 04/26/2018 FINDINGS: Lower chest: Chronic interstitial changes noted in the lung bases. Hepatobiliary: Scattered tiny hypodensities in the liver parenchyma are too small to characterize but are statistically most likely benign. No followup imaging is recommended. No suspicious enhancing liver lesion. Intrahepatic biliary duct dilatation is new since previous MRI. Common bile duct in the head of the  pancreas measures up to 9 mm diameter with relatively abrupt tapering that appears to occur just proximal to the ampulla (axial 35/2 and well seen coronal 70/7). No calcified stones in the gallbladder or common bile duct. 11 x 9 mm apparent mural nodule is seen in the anterior wall of the gallbladder on axial 26/2 and coronal 63/7. This appears to be a mural lesion is opposed to a mucosal abnormality such as a polyp. Pancreas: No focal mass lesion. No dilatation of the main duct. No intraparenchymal cyst. No peripancreatic edema. Spleen: No splenomegaly. No suspicious focal mass lesion. Adrenals/Urinary Tract: No adrenal nodule or mass. 7.1 cm simple cyst noted upper pole right kidney increased from 5.5 cm on previous MRI. No followup imaging is recommended. Kidneys otherwise unremarkable. No evidence for hydroureter. The urinary bladder appears normal for the degree of distention. Stomach/Bowel: Stomach is unremarkable. No gastric wall thickening. No evidence of outlet obstruction. Duodenum is normally positioned as is the ligament of Treitz. Duodenal diverticulum noted. No small bowel wall thickening. No small bowel dilatation. The terminal ileum is normal. The appendix is normal. No gross colonic mass. No colonic wall thickening. Diverticuli are seen scattered along the entire length of the colon without CT findings of diverticulitis. Vascular/Lymphatic: There is mild atherosclerotic calcification of the abdominal aorta without aneurysm.  Borderline enlarged lymph nodes are seen in the hepatoduodenal ligament, likely reactive. No retroperitoneal lymphadenopathy. No pelvic sidewall lymphadenopathy. Reproductive: The prostate gland and seminal vesicles are unremarkable. Other: No intraperitoneal free fluid. Musculoskeletal: Small bilateral groin hernias, left greater than right contain only fat. Small fat containing umbilical hernia evident. No worrisome lytic or sclerotic osseous abnormality. IMPRESSION: 1. Intra  and extrahepatic biliary duct dilatation appears new since previous MRI from 2019. These findings are also new since a chest CT of 08/10/2021 that included the liver and common bile duct. Common bile duct tapers relatively abruptly in the head of the pancreas just proximal to the ampulla without obstructing mass lesion or stone evident by CT imaging. MRCP may provide additional characterization, but ultimately, ERCP may be warranted as benign stricture could have this appearance. 2. 11 x 9 mm mural nodule mid gallbladder. This appears to be mural based rather than a mucosal lesion such as polyp. MRI abdomen with and without contrast may prove helpful to further evaluate. 3. Diffuse colonic diverticulosis without diverticulitis. 4.  Aortic Atherosclerois (ICD10-170.0) Electronically Signed   By: Kennith Center M.D.   On: 09/30/2023 09:41   DG Chest 2 View Result Date: 09/30/2023 CLINICAL DATA:  Cough.  Fever.  Body aches. EXAM: CHEST - 2 VIEW COMPARISON:  07/01/2021 FINDINGS: The lungs are clear without focal pneumonia, edema, pneumothorax or pleural effusion. Scattered areas of architectural distortion/scarring are noted bilaterally, better characterized on recent chest CT of 06/21/2023. No suspicious pulmonary nodule or mass. No acute bony abnormality. Telemetry leads overlie the chest. IMPRESSION: No acute cardiopulmonary disease. Electronically Signed   By: Kennith Center M.D.   On: 09/30/2023 06:12        Scheduled Meds:  heparin  5,000 Units Subcutaneous Q8H   pantoprazole  20 mg Oral Daily   Continuous Infusions:   LOS: 1 day    Time spent: 35 minutes.     Alba Cory, MD [facility]   If 7PM-7AM, please contact night-coverage [email or web address]  10/01/2023, 7:01 AM

## 2023-10-01 NOTE — Consult Note (Addendum)
 CC: nausea & vomiting  Requesting provider: Dr Sunnie Nielsen  HPI: Andrew Douglas is an 78 y.o. 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  He was mainly postprandial but also would occur after laying down.  He denies any abdominal pain.  He reports subjective chills.  No fever.  Continue to have normal bowel movements but he states that he had trouble keeping down any food so he came down to the emergency room.  In the emergency room he was found to have elevated LFTs as well as dilation of intra and extrahepatic bile ducts which was new compared to prior imaging.  He was also found to have a gallbladder mural nodule.  We were asked to evaluate the gallbladder.  He denies any prior surgical history.  He denies any weight loss.  He denies any changes in his stools.  He denies any family history.  He is followed by [facility].  It appears he last saw them in November 2024 estimated hepatic fibrosis is F3-4  Past Medical History:  Diagnosis Date   Hepatitis C    Hypertension     History reviewed. No pertinent surgical history.  History reviewed. No pertinent family history.  Social:  reports that he has quit smoking. His smoking use included cigarettes. He has been exposed to tobacco smoke. He has never used smokeless tobacco. No history on file for alcohol use and drug use.  Allergies: No Known Allergies  Medications: I have reviewed the patient's current medications.   ROS - all of the below systems have been reviewed with the patient and positives are indicated with bold text General: chills, fever or night sweats Eyes: blurry vision or double vision ENT: epistaxis or sore throat Allergy/Immunology: itchy/watery eyes or nasal congestion Hematologic/Lymphatic: bleeding problems, blood clots or swollen lymph nodes Endocrine: temperature  intolerance or unexpected weight changes Breast: new or changing breast lumps or nipple discharge Resp: cough, shortness of breath, or wheezing CV: chest pain or dyspnea on exertion GI: as per HPI GU: dysuria, trouble voiding, or hematuria MSK: joint pain or joint stiffness Neuro: TIA or stroke symptoms Derm: pruritus and skin lesion changes Psych: anxiety and depression  PE Blood pressure 122/80, pulse 82, temperature 97.9 F (36.6 C), temperature source Oral, resp. rate 18, height 6\' 1"  (1.854 m), weight 88.6 kg, SpO2 95%. Constitutional: NAD; conversant; no deformities Eyes: Moist conjunctiva; no lid lag; anicteric; PERRL Neck: Trachea midline; no thyromegaly Lungs: Normal respiratory effort; no tactile fremitus CV: RRR; no palpable thrills; no pitting edema GI: Abd soft, nontender, nondistended; no palpable hepatosplenomegaly MSK: Normal gait; no clubbing/cyanosis Psychiatric: Appropriate affect; alert and oriented x3 Lymphatic: No palpable cervical or axillary lymphadenopathy Skin: No rash or obvious jaundice.  Results for orders placed or performed during the hospital encounter of 09/30/23 (from the past 48 hours)  Urinalysis, Routine w reflex microscopic -Urine, Clean Catch     Status: Abnormal   Collection Time: 09/30/23  5:24 AM  Result Value Ref Range   Color, Urine AMBER (A) YELLOW    Comment: BIOCHEMICALS MAY BE AFFECTED BY COLOR   APPearance CLEAR CLEAR   Specific Gravity, Urine 1.015 1.005 - 1.030   pH 6.0 5.0 - 8.0   Glucose, UA NEGATIVE NEGATIVE mg/dL   Hgb urine dipstick NEGATIVE NEGATIVE   Bilirubin Urine SMALL (A) NEGATIVE   Ketones, ur NEGATIVE  NEGATIVE mg/dL   Protein, ur NEGATIVE NEGATIVE mg/dL   Nitrite NEGATIVE NEGATIVE   Leukocytes,Ua NEGATIVE NEGATIVE    Comment: Performed at [facility], [address], [place], [place] [address]  Resp panel by RT-PCR (RSV, Flu A&B, Covid) Anterior Nasal Swab     Status: None   Collection Time:  09/30/23  6:13 AM   Specimen: Anterior Nasal Swab  Result Value Ref Range   SARS Coronavirus 2 by RT PCR NEGATIVE NEGATIVE    Comment: (NOTE) SARS-CoV-2 target nucleic acids are NOT DETECTED.  The SARS-CoV-2 RNA is generally detectable in upper respiratory specimens during the acute phase of infection. The lowest concentration of SARS-CoV-2 viral copies this assay can detect is 138 copies/mL. A negative result does not preclude SARS-Cov-2 infection and should not be used as the sole basis for treatment or other patient management decisions. A negative result may occur with  improper specimen collection/handling, submission of specimen other than nasopharyngeal swab, presence of viral mutation(s) within the areas targeted by this assay, and inadequate number of viral copies(<138 copies/mL). A negative result must be combined with clinical observations, patient history, and epidemiological information. The expected result is Negative.  Fact Sheet for Patients:  BloggerCourse.com  Fact Sheet for Healthcare Providers:  SeriousBroker.it  This test is no t yet approved or cleared by the [place] FDA and  has been authorized for detection and/or diagnosis of SARS-CoV-2 by FDA under an Emergency Use Authorization (EUA). This EUA will remain  in effect (meaning this test can be used) for the duration of the COVID-19 declaration under Section 564(b)(1) of the Act, 21 U.S.C.section 360bbb-3(b)(1), unless the authorization is terminated  or revoked sooner.       Influenza A by PCR NEGATIVE NEGATIVE   Influenza B by PCR NEGATIVE NEGATIVE    Comment: (NOTE) The Xpert Xpress SARS-CoV-2/FLU/RSV plus assay is intended as an aid in the diagnosis of influenza from Nasopharyngeal swab specimens and should not be used as a sole basis for treatment. Nasal washings and aspirates are unacceptable for Xpert Xpress  SARS-CoV-2/FLU/RSV testing.  Fact Sheet for Patients: BloggerCourse.com  Fact Sheet for Healthcare Providers: SeriousBroker.it  This test is not yet approved or cleared by the [place] FDA and has been authorized for detection and/or diagnosis of SARS-CoV-2 by FDA under an Emergency Use Authorization (EUA). This EUA will remain in effect (meaning this test can be used) for the duration of the COVID-19 declaration under Section 564(b)(1) of the Act, 21 U.S.C. section 360bbb-3(b)(1), unless the authorization is terminated or revoked.     Resp Syncytial Virus by PCR NEGATIVE NEGATIVE    Comment: (NOTE) Fact Sheet for Patients: BloggerCourse.com  Fact Sheet for Healthcare Providers: SeriousBroker.it  This test is not yet approved or cleared by the [place] FDA and has been authorized for detection and/or diagnosis of SARS-CoV-2 by FDA under an Emergency Use Authorization (EUA). This EUA will remain in effect (meaning this test can be used) for the duration of the COVID-19 declaration under Section 564(b)(1) of the Act, 21 U.S.C. section 360bbb-3(b)(1), unless the authorization is terminated or revoked.  Performed at [facility], [address] [place], [place] [address]   Lipase, blood     Status: None   Collection Time: 09/30/23  6:17 AM  Result Value Ref Range   Lipase 20 11 - 51 U/L    Comment: Performed at [facility], [address] [place], [place] [address]  Comprehensive metabolic panel     Status: Abnormal   Collection Time: 09/30/23  6:17 AM  Result Value Ref Range   Sodium 138 135 - 145 mmol/L   Potassium 3.1 (L) 3.5 - 5.1 mmol/L   Chloride 102 98 - 111 mmol/L   CO2 25 22 - 32 mmol/L   Glucose, Bld 137 (H) 70 - 99 mg/dL    Comment: Glucose reference range applies only to samples taken after fasting for at least 8  hours.   BUN 19 8 - 23 mg/dL   Creatinine, Ser 1.61 0.61 - 1.24 mg/dL   Calcium 8.9 8.9 - 09.6 mg/dL   Total Protein 7.1 6.5 - 8.1 g/dL   Albumin 3.3 (L) 3.5 - 5.0 g/dL   AST 045 (H) 15 - 41 U/L   ALT 281 (H) 0 - 44 U/L   Alkaline Phosphatase 292 (H) 38 - 126 U/L   Total Bilirubin 6.6 (H) 0.0 - 1.2 mg/dL   GFR, Estimated >40 >98 mL/min    Comment: (NOTE) Calculated using the CKD-EPI Creatinine Equation (2021)    Anion gap 11 5 - 15    Comment: Performed at [facility], [address] [place], [place] [address]  CBC     Status: Abnormal   Collection Time: 09/30/23  6:17 AM  Result Value Ref Range   WBC 11.7 (H) 4.0 - 10.5 K/uL   RBC 4.50 4.22 - 5.81 MIL/uL   Hemoglobin 12.6 (L) 13.0 - 17.0 g/dL   HCT 78.2 95.6 - 21.3 %   MCV 88.9 80.0 - 100.0 fL   MCH 28.0 26.0 - 34.0 pg   MCHC 31.5 30.0 - 36.0 g/dL   RDW 08.6 57.8 - 46.9 %   Platelets 261 150 - 400 K/uL   nRBC 0.0 0.0 - 0.2 %    Comment: Performed at [facility], [address], [place], [place] [address]  Lactic acid, plasma     Status: None   Collection Time: 09/30/23  6:25 AM  Result Value Ref Range   Lactic Acid, Venous 1.4 0.5 - 1.9 mmol/L    Comment: Performed at [facility], [address] [place], [place] [address]  Magnesium     Status: None   Collection Time: 09/30/23  1:54 PM  Result Value Ref Range   Magnesium 2.0 1.7 - 2.4 mg/dL    Comment: Performed at [facility], [address] [place], [place] [address]  Hepatitis panel, acute     Status: Abnormal   Collection Time: 09/30/23  1:54 PM  Result Value Ref Range   Hepatitis B Surface Ag NON REACTIVE NON REACTIVE   HCV Ab Reactive (A) NON REACTIVE    Comment: (NOTE) The CDC recommends that a Reactive HCV antibody result be followed up  with a HCV Nucleic Acid Amplification test.     Hep A IgM NON REACTIVE NON REACTIVE   Hep B C IgM NON REACTIVE NON REACTIVE    Comment:  Performed at [facility], [address], [place], [place] [address]  Comprehensive metabolic panel     Status: Abnormal   Collection Time: 10/01/23  5:21 AM  Result Value Ref Range   Sodium 138 135 - 145 mmol/L   Potassium 3.2 (L) 3.5 - 5.1 mmol/L   Chloride 106 98 - 111 mmol/L   CO2 24 22 - 32 mmol/L   Glucose, Bld 101 (H) 70 - 99 mg/dL  Comment: Glucose reference range applies only to samples taken after fasting for at least 8 hours.   BUN 15 8 - 23 mg/dL   Creatinine, Ser 1.32 0.61 - 1.24 mg/dL   Calcium 8.1 (L) 8.9 - 10.3 mg/dL   Total Protein 6.3 (L) 6.5 - 8.1 g/dL   Albumin 2.7 (L) 3.5 - 5.0 g/dL   AST 440 (H) 15 - 41 U/L   ALT 195 (H) 0 - 44 U/L   Alkaline Phosphatase 246 (H) 38 - 126 U/L   Total Bilirubin 7.0 (H) 0.0 - 1.2 mg/dL   GFR, Estimated >10 >27 mL/min    Comment: (NOTE) Calculated using the CKD-EPI Creatinine Equation (2021)    Anion gap 8 5 - 15    Comment: Performed at [facility], [address], [place], [place] [address]  CBC     Status: Abnormal   Collection Time: 10/01/23  5:21 AM  Result Value Ref Range   WBC 9.4 4.0 - 10.5 K/uL   RBC 3.83 (L) 4.22 - 5.81 MIL/uL   Hemoglobin 10.9 (L) 13.0 - 17.0 g/dL   HCT 44.0 (L) 34.7 - 42.5 %   MCV 87.7 80.0 - 100.0 fL   MCH 28.5 26.0 - 34.0 pg   MCHC 32.4 30.0 - 36.0 g/dL   RDW 95.6 38.7 - 56.4 %   Platelets 240 150 - 400 K/uL   nRBC 0.0 0.0 - 0.2 %    Comment: Performed at [facility], [address] [place], [place] [address]    MR ABDOMEN MRCP W WO CONTAST Result Date: 10/01/2023 CLINICAL DATA:  Biliary obstruction suspected, biliary ductal dilatation identified by prior CT nausea and vomiting EXAM: MRI ABDOMEN WITHOUT AND WITH CONTRAST (INCLUDING MRCP) TECHNIQUE: Multiplanar multisequence MR imaging of the abdomen was performed both before and after the administration of intravenous contrast. Heavily T2-weighted images of the biliary and pancreatic ducts were  obtained, and three-dimensional MRCP images were rendered by post processing. CONTRAST:  8mL GADAVIST GADOBUTROL 1 MMOL/ML IV SOLN COMPARISON:  CT abdomen pelvis, 09/30/2023 FINDINGS: Lower chest: No acute abnormality. Hepatobiliary: No solid liver abnormality is seen. Substantial intra and extrahepatic biliary ductal dilatation, common bile duct measuring up to 1.1 cm in caliber. The duct tapers smoothly to the ampulla; there appears to be layering sludge or gravel at the ampulla without discrete calculi (series 3, image 18, series 4, image 27). Mild gallbladder wall thickening. Enhancing exophytic mural nodule of the anterior medial aspect of the gallbladder wall measuring 1.5 x 0.9 cm (series 13, image 48). Pancreas: Unremarkable. No pancreatic ductal dilatation or surrounding inflammatory changes. Spleen: Normal in size without significant abnormality. Adrenals/Urinary Tract: Adrenal glands are unremarkable. Simple, benign cyst of the superior pole of the right kidney, requiring no specific further follow-up or characterization. Kidneys are otherwise normal, without obvious renal calculi, solid lesion, or hydronephrosis. Stomach/Bowel: Stomach is within normal limits. Descending periampullary duodenal diverticulum. No evidence of bowel wall thickening, distention, or inflammatory changes. Pancolonic diverticulosis. Vascular/Lymphatic: No significant vascular findings are present. No enlarged abdominal lymph nodes. Other: No abdominal wall hernia or abnormality. No ascites. Musculoskeletal: No acute or significant osseous findings. IMPRESSION: 1. Substantial intra and extrahepatic biliary ductal dilatation, common bile duct measuring up to 1.1 cm in caliber. The duct tapers smoothly to the ampulla; there appears to be layering sludge or gravel at the ampulla without discrete calculi. No obstructing mass or other obstructing lesion identified. 2. Mild, diffuse gallbladder wall thickening, concerning  for  cholecystitis. 3. Enhancing exophytic mural nodule of the anterior medial aspect of the gallbladder wall measuring 1.5 x 0.9 cm, suspicious for gallbladder neoplasm, possibly reflecting very prominent focal adenomyomatosis. These results will be called to the ordering clinician or representative by the Radiologist Assistant, and communication documented in the PACS or [employer]. Electronically Signed   By: Jearld Lesch M.D.   On: 10/01/2023 09:42   MR 3D Recon At Scanner Result Date: 10/01/2023 CLINICAL DATA:  Biliary obstruction suspected, biliary ductal dilatation identified by prior CT nausea and vomiting EXAM: MRI ABDOMEN WITHOUT AND WITH CONTRAST (INCLUDING MRCP) TECHNIQUE: Multiplanar multisequence MR imaging of the abdomen was performed both before and after the administration of intravenous contrast. Heavily T2-weighted images of the biliary and pancreatic ducts were obtained, and three-dimensional MRCP images were rendered by post processing. CONTRAST:  8mL GADAVIST GADOBUTROL 1 MMOL/ML IV SOLN COMPARISON:  CT abdomen pelvis, 09/30/2023 FINDINGS: Lower chest: No acute abnormality. Hepatobiliary: No solid liver abnormality is seen. Substantial intra and extrahepatic biliary ductal dilatation, common bile duct measuring up to 1.1 cm in caliber. The duct tapers smoothly to the ampulla; there appears to be layering sludge or gravel at the ampulla without discrete calculi (series 3, image 18, series 4, image 27). Mild gallbladder wall thickening. Enhancing exophytic mural nodule of the anterior medial aspect of the gallbladder wall measuring 1.5 x 0.9 cm (series 13, image 48). Pancreas: Unremarkable. No pancreatic ductal dilatation or surrounding inflammatory changes. Spleen: Normal in size without significant abnormality. Adrenals/Urinary Tract: Adrenal glands are unremarkable. Simple, benign cyst of the superior pole of the right kidney, requiring no specific further follow-up or characterization.  Kidneys are otherwise normal, without obvious renal calculi, solid lesion, or hydronephrosis. Stomach/Bowel: Stomach is within normal limits. Descending periampullary duodenal diverticulum. No evidence of bowel wall thickening, distention, or inflammatory changes. Pancolonic diverticulosis. Vascular/Lymphatic: No significant vascular findings are present. No enlarged abdominal lymph nodes. Other: No abdominal wall hernia or abnormality. No ascites. Musculoskeletal: No acute or significant osseous findings. IMPRESSION: 1. Substantial intra and extrahepatic biliary ductal dilatation, common bile duct measuring up to 1.1 cm in caliber. The duct tapers smoothly to the ampulla; there appears to be layering sludge or gravel at the ampulla without discrete calculi. No obstructing mass or other obstructing lesion identified. 2. Mild, diffuse gallbladder wall thickening, concerning for cholecystitis. 3. Enhancing exophytic mural nodule of the anterior medial aspect of the gallbladder wall measuring 1.5 x 0.9 cm, suspicious for gallbladder neoplasm, possibly reflecting very prominent focal adenomyomatosis. These results will be called to the ordering clinician or representative by the Radiologist Assistant, and communication documented in the PACS or [employer]. Electronically Signed   By: Jearld Lesch M.D.   On: 10/01/2023 09:42   CT ABDOMEN PELVIS W CONTRAST Result Date: 09/30/2023 CLINICAL DATA:  Transaminitis with nausea and vomiting. EXAM: CT ABDOMEN AND PELVIS WITH CONTRAST TECHNIQUE: Multidetector CT imaging of the abdomen and pelvis was performed using the standard protocol following bolus administration of intravenous contrast. RADIATION DOSE REDUCTION: This exam was performed according to the departmental dose-optimization program which includes automated exposure control, adjustment of the mA and/or kV according to patient size and/or use of iterative reconstruction technique. CONTRAST:  OMNIPAQUE  IOHEXOL 300 MG/ML  SOLN COMPARISON:  Abdominal ultrasound 05/25/2023. MRI abdomen 04/26/2018 FINDINGS: Lower chest: Chronic interstitial changes noted in the lung bases. Hepatobiliary: Scattered tiny hypodensities in the liver parenchyma are too small to characterize but are statistically most likely benign.  No followup imaging is recommended. No suspicious enhancing liver lesion. Intrahepatic biliary duct dilatation is new since previous MRI. Common bile duct in the head of the pancreas measures up to 9 mm diameter with relatively abrupt tapering that appears to occur just proximal to the ampulla (axial 35/2 and well seen coronal 70/7). No calcified stones in the gallbladder or common bile duct. 11 x 9 mm apparent mural nodule is seen in the anterior wall of the gallbladder on axial 26/2 and coronal 63/7. This appears to be a mural lesion is opposed to a mucosal abnormality such as a polyp. Pancreas: No focal mass lesion. No dilatation of the main duct. No intraparenchymal cyst. No peripancreatic edema. Spleen: No splenomegaly. No suspicious focal mass lesion. Adrenals/Urinary Tract: No adrenal nodule or mass. 7.1 cm simple cyst noted upper pole right kidney increased from 5.5 cm on previous MRI. No followup imaging is recommended. Kidneys otherwise unremarkable. No evidence for hydroureter. The urinary bladder appears normal for the degree of distention. Stomach/Bowel: Stomach is unremarkable. No gastric wall thickening. No evidence of outlet obstruction. Duodenum is normally positioned as is the ligament of Treitz. Duodenal diverticulum noted. No small bowel wall thickening. No small bowel dilatation. The terminal ileum is normal. The appendix is normal. No gross colonic mass. No colonic wall thickening. Diverticuli are seen scattered along the entire length of the colon without CT findings of diverticulitis. Vascular/Lymphatic: There is mild atherosclerotic calcification of the abdominal aorta without aneurysm.  Borderline enlarged lymph nodes are seen in the hepatoduodenal ligament, likely reactive. No retroperitoneal lymphadenopathy. No pelvic sidewall lymphadenopathy. Reproductive: The prostate gland and seminal vesicles are unremarkable. Other: No intraperitoneal free fluid. Musculoskeletal: Small bilateral groin hernias, left greater than right contain only fat. Small fat containing umbilical hernia evident. No worrisome lytic or sclerotic osseous abnormality. IMPRESSION: 1. Intra and extrahepatic biliary duct dilatation appears new since previous MRI from 2019. These findings are also new since a chest CT of 08/10/2021 that included the liver and common bile duct. Common bile duct tapers relatively abruptly in the head of the pancreas just proximal to the ampulla without obstructing mass lesion or stone evident by CT imaging. MRCP may provide additional characterization, but ultimately, ERCP may be warranted as benign stricture could have this appearance. 2. 11 x 9 mm mural nodule mid gallbladder. This appears to be mural based rather than a mucosal lesion such as polyp. MRI abdomen with and without contrast may prove helpful to further evaluate. 3. Diffuse colonic diverticulosis without diverticulitis. 4.  Aortic Atherosclerois (ICD10-170.0) Electronically Signed   By: Kennith Center M.D.   On: 09/30/2023 09:41   DG Chest 2 View Result Date: 09/30/2023 CLINICAL DATA:  Cough.  Fever.  Body aches. EXAM: CHEST - 2 VIEW COMPARISON:  07/01/2021 FINDINGS: The lungs are clear without focal pneumonia, edema, pneumothorax or pleural effusion. Scattered areas of architectural distortion/scarring are noted bilaterally, better characterized on recent chest CT of 06/21/2023. No suspicious pulmonary nodule or mass. No acute bony abnormality. Telemetry leads overlie the chest. IMPRESSION: No acute cardiopulmonary disease. Electronically Signed   By: Kennith Center M.D.   On: 09/30/2023 06:12    Imaging: Personally  reviewed  A/P: Yovanni Frenette is an 78 y.o. male  Nausea/vomiting Dilated intra and extrahepatic ductal dilatation Distal common bile duct narrowing concerning for debris or sludge Elevated LFTs Gallbladder wall mural nodule 1.5 x 0.9 cm possible gallbladder malignancy History of hep C status posttreatment   Agree with gastroenterology patient needs  ERCP with sphincterotomy and stent to evaluate the bile duct.  It is possible that there is thick sludge or tiny stones in the distal common bile duct causing the dilated bile ducts. No overt mass seen in the bile duct or head of the pancreas. I do not believe the patient has acute cholecystitis, may have some component of chronic wall thickening.  He denies any abdominal pain.  He has no pain on exam.  The gallbladder wall nodule is atypical could just be benign lesion versus malignancy.  No evidence of hepatic masses or lymphadenopathy in the biliary tree.  The gallbladder wall nodule is on the anterior wall and the tissue planes around that area looks favorable so its potential laparoscopic cholecystectomy would be safe but we  Will await to see what is seen on ERCP and go from there.  May need EUS to evaluate head of the pancreas  Trend LFTs  I reviewed the anatomy and imaging findings with the patient and drew a diagram for him   Data reviewed: Reviewed his H&P, ED notes, consultant notes from GI, imaging from admission including CT and MRI, labs since admission and in the ER, I reviewed his outpatient note with [facility].  Along with his comprehensive metabolic panel from October 2024 in October 2023.  Discussed case with gastroenterology as well as one of my hepatobiliary colleagues  Complex medical decision making  Mary Sella. Andrey Campanile, MD, FACS General, Bariatric, & Minimally Invasive Surgery [facility]

## 2023-10-01 NOTE — Progress Notes (Addendum)
   Gastroenterology ERCP coverage  I was asked by Dr. Levora Angel to review this patient's situation.  The patient has findings of obstructive jaundice from labs and MRCP which shows dilated biliary tree, possible cholecystitis, gallbladder lesion and sludge material in the distal bile duct.  ERCP with biliary sphincterotomy and sludge possible stone removal stenting is indicated.  I called the patient and reviewed the risks benefits and indications and he understands and agrees to proceed.  We will order this for tomorrow.   Iva Boop, MD, [facility] See Loretha Stapler on call - gastroenterology for best contact person 10/01/2023 12:31 PM

## 2023-10-01 NOTE — Progress Notes (Signed)
 [facility] Gastroenterology Progress Note  Andrew Douglas 78 y.o. Apr 15, 1946  CC: Abnormal LFTs   Subjective: Patient seen and examined at bedside.  Family at bedside.  Denies any abdominal pain, nausea and vomiting.  Wants to eat something.  ROS : Afebrile, negative for chest pain   Objective: Vital signs in last 24 hours: Vitals:   10/01/23 0548 10/01/23 0924  BP: 107/66 122/80  Pulse: 81 82  Resp: 18   Temp: 99.1 F (37.3 C)   SpO2: 91% 95%    Physical Exam:  General:  Alert, cooperative, no distress, appears stated age  Head:  Normocephalic, without obvious abnormality, atraumatic  Eyes:  icterus noted  Lungs:   No visible respiratory distress  Heart:  Regular rate and rhythm, S1, S2 normal  Abdomen:   Soft, non-tender, bowel sounds active all four quadrants,  no masses,   Extremities: Extremities normal, atraumatic, no  edema  Pulses: 2+ and symmetric    Lab Results: Recent Labs    09/30/23 0617 09/30/23 1354 10/01/23 0521  NA 138  --  138  K 3.1*  --  3.2*  CL 102  --  106  CO2 25  --  24  GLUCOSE 137*  --  101*  BUN 19  --  15  CREATININE 1.12  --  0.82  CALCIUM 8.9  --  8.1*  MG  --  2.0  --    Recent Labs    09/30/23 0617 10/01/23 0521  AST 228* 123*  ALT 281* 195*  ALKPHOS 292* 246*  BILITOT 6.6* 7.0*  PROT 7.1 6.3*  ALBUMIN 3.3* 2.7*   Recent Labs    09/30/23 0617 10/01/23 0521  WBC 11.7* 9.4  HGB 12.6* 10.9*  HCT 40.0 33.6*  MCV 88.9 87.7  PLT 261 240   No results for input(s): "LABPROT", "INR" in the last 72 hours.    Assessment/Plan:  -Abnormal LFTs with T. bili of 7.  MRI MRCP yesterday showed dilated CBD of 1.1 cm with layering sludge near ampulla. 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  No evidence of cirrhosis on MRI. -Gallbladder wall  thickening concerning for cholecystitis.  Recommendations ----------------------- -I will discuss this case with Dr. Concha Se regarding tentative ERCP this weekend. -Okay to have soft diet today.  Keep n.p.o. past midnight. -Repeat labs in the morning.  Check INR. -Procedure ERCP was discussed with the patient and family at bedside. -Also discussed with hospitalist.  He may need surgical consult for evaluation for cholecystectomy  Risks (post ERCP pancreatitis, bleeding, infection, bowel perforation that could require surgery, sedation-related changes in cardiopulmonary systems), benefits (identification and possible treatment of source of symptoms, exclusion of certain causes of symptoms), and alternatives (watchful waiting, radiographic imaging studies, empiric medical treatment)  were explained to patient/family in detail and patient wishes to proceed.    Kathi Der MD, FACP 10/01/2023, 9:48 AM  Contact #  [phone]

## 2023-10-01 NOTE — Plan of Care (Signed)
 ?  Problem: Clinical Measurements: ?Goal: Will remain free from infection ?Outcome: Progressing ?  ?

## 2023-10-01 NOTE — H&P (View-Only) (Signed)
 [facility] Gastroenterology Progress Note  Andrew Douglas 78 y.o. Apr 15, 1946  CC: Abnormal LFTs   Subjective: Patient seen and examined at bedside.  Family at bedside.  Denies any abdominal pain, nausea and vomiting.  Wants to eat something.  ROS : Afebrile, negative for chest pain   Objective: Vital signs in last 24 hours: Vitals:   10/01/23 0548 10/01/23 0924  BP: 107/66 122/80  Pulse: 81 82  Resp: 18   Temp: 99.1 F (37.3 C)   SpO2: 91% 95%    Physical Exam:  General:  Alert, cooperative, no distress, appears stated age  Head:  Normocephalic, without obvious abnormality, atraumatic  Eyes:  icterus noted  Lungs:   No visible respiratory distress  Heart:  Regular rate and rhythm, S1, S2 normal  Abdomen:   Soft, non-tender, bowel sounds active all four quadrants,  no masses,   Extremities: Extremities normal, atraumatic, no  edema  Pulses: 2+ and symmetric    Lab Results: Recent Labs    09/30/23 0617 09/30/23 1354 10/01/23 0521  NA 138  --  138  K 3.1*  --  3.2*  CL 102  --  106  CO2 25  --  24  GLUCOSE 137*  --  101*  BUN 19  --  15  CREATININE 1.12  --  0.82  CALCIUM 8.9  --  8.1*  MG  --  2.0  --    Recent Labs    09/30/23 0617 10/01/23 0521  AST 228* 123*  ALT 281* 195*  ALKPHOS 292* 246*  BILITOT 6.6* 7.0*  PROT 7.1 6.3*  ALBUMIN 3.3* 2.7*   Recent Labs    09/30/23 0617 10/01/23 0521  WBC 11.7* 9.4  HGB 12.6* 10.9*  HCT 40.0 33.6*  MCV 88.9 87.7  PLT 261 240   No results for input(s): "LABPROT", "INR" in the last 72 hours.    Assessment/Plan:  -Abnormal LFTs with T. bili of 7.  MRI MRCP yesterday showed dilated CBD of 1.1 cm with layering sludge near ampulla. 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  No evidence of cirrhosis on MRI. -Gallbladder wall  thickening concerning for cholecystitis.  Recommendations ----------------------- -I will discuss this case with Dr. Concha Se regarding tentative ERCP this weekend. -Okay to have soft diet today.  Keep n.p.o. past midnight. -Repeat labs in the morning.  Check INR. -Procedure ERCP was discussed with the patient and family at bedside. -Also discussed with hospitalist.  He may need surgical consult for evaluation for cholecystectomy  Risks (post ERCP pancreatitis, bleeding, infection, bowel perforation that could require surgery, sedation-related changes in cardiopulmonary systems), benefits (identification and possible treatment of source of symptoms, exclusion of certain causes of symptoms), and alternatives (watchful waiting, radiographic imaging studies, empiric medical treatment)  were explained to patient/family in detail and patient wishes to proceed.    Kathi Der MD, FACP 10/01/2023, 9:48 AM  Contact #  [phone]

## 2023-10-01 NOTE — Progress Notes (Signed)
 Pharmacy Antibiotic Note  Andrew Douglas is a 78 y.o. male admitted on 09/30/2023 with  IAI .  Pharmacy has been consulted for Unasyn dosing.  Plan: Unasyn 3g IV q6 Will sign off  Height: 6\' 1"  (185.4 cm) Weight: 88.6 kg (195 lb 5.2 oz) IBW/kg (Calculated) : 79.9  Temp (24hrs), Avg:98.8 F (37.1 C), Min:97.9 F (36.6 C), Max:99.9 F (37.7 C)  Recent Labs  Lab 09/30/23 0617 09/30/23 0625 10/01/23 0521  WBC 11.7*  --  9.4  CREATININE 1.12  --  0.82  LATICACIDVEN  --  1.4  --     Estimated Creatinine Clearance: 83.9 mL/min (by C-G formula based on SCr of 0.82 mg/dL).    No Known Allergies  Thank you for allowing pharmacy to be a part of this patient's care.  Berkley Harvey 10/01/2023 10:26 AM

## 2023-10-02 ENCOUNTER — Inpatient Hospital Stay (HOSPITAL_COMMUNITY)

## 2023-10-02 ENCOUNTER — Inpatient Hospital Stay (HOSPITAL_COMMUNITY): Admitting: Certified Registered"

## 2023-10-02 ENCOUNTER — Encounter (HOSPITAL_COMMUNITY): Payer: Self-pay | Admitting: Internal Medicine

## 2023-10-02 ENCOUNTER — Encounter (HOSPITAL_COMMUNITY)
Admission: EM | Disposition: A | Discharge status: Home / Self Care | Payer: Self-pay | Source: Home / Self Care | Attending: Internal Medicine

## 2023-10-02 DIAGNOSIS — S27818A Other injury of esophagus (thoracic part), initial encounter: Secondary | ICD-10-CM | POA: Diagnosis not present

## 2023-10-02 DIAGNOSIS — S3639XA Other injury of stomach, initial encounter: Secondary | ICD-10-CM | POA: Diagnosis not present

## 2023-10-02 DIAGNOSIS — K2100 Gastro-esophageal reflux disease with esophagitis, without bleeding: Secondary | ICD-10-CM | POA: Diagnosis not present

## 2023-10-02 DIAGNOSIS — K831 Obstruction of bile duct: Secondary | ICD-10-CM | POA: Diagnosis not present

## 2023-10-02 HISTORY — PX: ESOPHAGOGASTRODUODENOSCOPY: SHX5428

## 2023-10-02 LAB — COMPREHENSIVE METABOLIC PANEL
ALT: 171 U/L — ABNORMAL HIGH (ref 0–44)
AST: 110 U/L — ABNORMAL HIGH (ref 15–41)
Albumin: 2.4 g/dL — ABNORMAL LOW (ref 3.5–5.0)
Alkaline Phosphatase: 223 U/L — ABNORMAL HIGH (ref 38–126)
Anion gap: 6 (ref 5–15)
BUN: 14 mg/dL (ref 8–23)
CO2: 23 mmol/L (ref 22–32)
Calcium: 8 mg/dL — ABNORMAL LOW (ref 8.9–10.3)
Chloride: 110 mmol/L (ref 98–111)
Creatinine, Ser: 0.9 mg/dL (ref 0.61–1.24)
GFR, Estimated: >60 mL/min (ref >60)
Glucose, Bld: 120 mg/dL — ABNORMAL HIGH (ref 70–99)
Potassium: 4.2 mmol/L (ref 3.5–5.1)
Sodium: 139 mmol/L (ref 135–145)
Total Bilirubin: 5.1 mg/dL — ABNORMAL HIGH (ref 0.0–1.2)
Total Protein: 5.8 g/dL — ABNORMAL LOW (ref 6.5–8.1)

## 2023-10-02 LAB — CBC
HCT: 33.5 % — ABNORMAL LOW (ref 39.0–52.0)
Hemoglobin: 10.7 g/dL — ABNORMAL LOW (ref 13.0–17.0)
MCH: 28.5 pg (ref 26.0–34.0)
MCHC: 31.9 g/dL (ref 30.0–36.0)
MCV: 89.3 fL (ref 80.0–100.0)
Platelets: 243 10*3/uL (ref 150–400)
RBC: 3.75 MIL/uL — ABNORMAL LOW (ref 4.22–5.81)
RDW: 14 % (ref 11.5–15.5)
WBC: 7.4 10*3/uL (ref 4.0–10.5)
nRBC: 0 % (ref 0.0–0.2)

## 2023-10-02 SURGERY — INVASIVE LAB ABORTED CASE
Anesthesia: General

## 2023-10-02 MED ORDER — ROCURONIUM BROMIDE 100 MG/10ML IV SOLN
INTRAVENOUS | Status: DC | PRN
  Administered 2023-10-02: 50 mg via INTRAVENOUS

## 2023-10-02 MED ORDER — FENTANYL CITRATE (PF) 100 MCG/2ML IJ SOLN
INTRAMUSCULAR | Status: AC
Start: 2023-10-02 — End: ?
  Filled 2023-10-02: qty 2

## 2023-10-02 MED ORDER — PANTOPRAZOLE SODIUM 40 MG IV SOLR
40.0000 mg | Freq: Two times a day (BID) | INTRAVENOUS | Status: DC
  Administered 2023-10-02 – 2023-10-05 (×7): 40 mg via INTRAVENOUS
  Filled 2023-10-02 (×7): qty 10

## 2023-10-02 MED ORDER — SODIUM CHLORIDE 0.9 % IV SOLN
INTRAVENOUS | Status: DC | PRN
  Administered 2023-10-02: 1 mL

## 2023-10-02 MED ORDER — GLUCAGON HCL RDNA (DIAGNOSTIC) 1 MG IJ SOLR
INTRAMUSCULAR | Status: AC
  Filled 2023-10-02: qty 1

## 2023-10-02 MED ORDER — SUGAMMADEX SODIUM 200 MG/2ML IV SOLN
INTRAVENOUS | Status: DC | PRN
  Administered 2023-10-02: 200 mg via INTRAVENOUS

## 2023-10-02 MED ORDER — DICLOFENAC SUPPOSITORY 100 MG
RECTAL | Status: DC | PRN
  Administered 2023-10-02: 100 mg via RECTAL

## 2023-10-02 MED ORDER — LIDOCAINE HCL (CARDIAC) PF 100 MG/5ML IV SOSY
PREFILLED_SYRINGE | INTRAVENOUS | Status: DC | PRN
  Administered 2023-10-02: 80 mg via INTRAVENOUS

## 2023-10-02 MED ORDER — ONDANSETRON HCL 4 MG/2ML IJ SOLN
INTRAMUSCULAR | Status: DC | PRN
Start: 2023-10-02 — End: 2023-10-02
  Administered 2023-10-02: 4 mg via INTRAVENOUS

## 2023-10-02 MED ORDER — FENTANYL CITRATE (PF) 100 MCG/2ML IJ SOLN
INTRAMUSCULAR | Status: DC | PRN
  Administered 2023-10-02 (×2): 50 ug via INTRAVENOUS

## 2023-10-02 MED ORDER — ONDANSETRON HCL 4 MG/2ML IJ SOLN
4.0000 mg | Freq: Four times a day (QID) | INTRAMUSCULAR | Status: AC | PRN
  Administered 2023-10-03: 4 mg via INTRAVENOUS

## 2023-10-02 MED ORDER — FENTANYL CITRATE (PF) 100 MCG/2ML IJ SOLN
25.0000 ug | INTRAMUSCULAR | Status: DC | PRN
  Administered 2023-10-03 (×2): 50 ug via INTRAVENOUS

## 2023-10-02 MED ORDER — PROPOFOL 10 MG/ML IV BOLUS
INTRAVENOUS | Status: AC
  Filled 2023-10-02: qty 20

## 2023-10-02 MED ORDER — DICLOFENAC SUPPOSITORY 100 MG
RECTAL | Status: AC
  Filled 2023-10-02: qty 1

## 2023-10-02 MED ORDER — PROPOFOL 10 MG/ML IV BOLUS
INTRAVENOUS | Status: DC | PRN
  Administered 2023-10-02: 200 mg via INTRAVENOUS

## 2023-10-02 MED ORDER — PHENYLEPHRINE HCL (PRESSORS) 10 MG/ML IV SOLN
INTRAVENOUS | Status: DC | PRN
  Administered 2023-10-02 (×4): 80 ug via INTRAVENOUS

## 2023-10-02 MED ORDER — DEXTROSE 5 % IV SOLN: INTRAVENOUS | Status: DC

## 2023-10-02 MED ORDER — OXYCODONE HCL 5 MG PO TABS: 5.0000 mg | ORAL_TABLET | Freq: Once | ORAL | Status: DC | PRN

## 2023-10-02 MED ORDER — OXYCODONE HCL 5 MG/5ML PO SOLN: 5.0000 mg | Freq: Once | ORAL | Status: DC | PRN

## 2023-10-02 MED ORDER — DEXAMETHASONE SODIUM PHOSPHATE 10 MG/ML IJ SOLN
INTRAMUSCULAR | Status: DC | PRN
  Administered 2023-10-02: 8 mg via INTRAVENOUS

## 2023-10-02 MED ORDER — LACTATED RINGERS IV SOLN: INTRAVENOUS | Status: DC | PRN

## 2023-10-02 NOTE — Progress Notes (Signed)
 [facility] Gastroenterology Progress Note  Andrew Douglas 78 y.o. 1945-07-28  CC: Abnormal LFTs   Subjective: Patient seen and examined at bedside.  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  Was also found to have mucosal laceration in the stomach and distal esophagus as well as mild esophagitis in the mid to distal esophagus.  ROS : Afebrile, negative for chest pain   Objective: Vital signs in last 24 hours: Vitals:   10/02/23 0900 10/02/23 0917  BP: 127/86 127/77  Pulse: 81 70  Resp: (!) 22 16  Temp: (!) 97.3 F (36.3 C) 98.4 F (36.9 C)  SpO2: 97% 98%    Physical Exam: Resting comfortably, not in acute distress.  Abdominal exam benign.  Mood and affect normal.  Lab Results: Recent Labs    09/30/23 1354 10/01/23 0521 10/02/23 0504  NA  --  138 139  K  --  3.2* 4.2  CL  --  106 110  CO2  --  24 23  GLUCOSE  --  101* 120*  BUN  --  15 14  CREATININE  --  0.82 0.90  CALCIUM  --  8.1* 8.0*  MG 2.0  --   --    Recent Labs    10/01/23 0521 10/02/23 0504  AST 123* 110*  ALT 195* 171*  ALKPHOS 246* 223*  BILITOT 7.0* 5.1*  PROT 6.3* 5.8*  ALBUMIN 2.7* 2.4*   Recent Labs    10/01/23 0521 10/02/23 0504  WBC 9.4 7.4  HGB 10.9* 10.7*  HCT 33.6* 33.5*  MCV 87.7 89.3  PLT 240 243   Recent Labs    10/01/23 1329  LABPROT 15.3*  INR 1.2      Assessment/Plan:  -Abnormal LFTs with T. bili of 7.  MRI MRCP yesterday showed dilated CBD of 1.1 cm with layering sludge near ampulla. 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  No evidence of cirrhosis on MRI. -Gallbladder wall thickening concerning for cholecystitis.  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  Was also found to have mucosal laceration in the stomach and distal esophagus as well as mild esophagitis in the mid to distal esophagus.  -Plan is to keep him n.p.o. today.  Restart liquid diet tomorrow.  Reevaluate for ERCP in the next few days.  -Surgery is following for possible cholecystectomy for abnormal imaging finding.  - Dr. Mann/Dr. Elnoria Howard to follow tomorrow.   Kathi Der MD, FACP 10/02/2023, 10:39 AM  Contact #  [phone]

## 2023-10-02 NOTE — Plan of Care (Signed)
  Problem: Education: Goal: Knowledge of General Education information will improve Description: Including pain rating scale, medication(s)/side effects and non-pharmacologic comfort measures Outcome: Progressing   Problem: Coping: Goal: Level of anxiety will decrease Outcome: Progressing   Problem: Pain Managment: Goal: General experience of comfort will improve and/or be controlled Outcome: Progressing

## 2023-10-02 NOTE — Anesthesia Preprocedure Evaluation (Signed)
 Anesthesia Evaluation  Patient identified by MRN, date of birth, ID band Patient awake    Reviewed: Allergy & Precautions, H&P , NPO status , Patient's Chart, lab work & pertinent test results  Airway Mallampati: II   Neck ROM: full    Dental   Pulmonary former smoker   breath sounds clear to auscultation       Cardiovascular hypertension,  Rhythm:regular Rate:Normal     Neuro/Psych    GI/Hepatic ,,,(+) Hepatitis -, CBiliary obstruction   Endo/Other    Renal/GU      Musculoskeletal   Abdominal   Peds  Hematology   Anesthesia Other Findings   Reproductive/Obstetrics                             Anesthesia Physical Anesthesia Plan  ASA: 2  Anesthesia Plan: General   Post-op Pain Management:    Induction: Intravenous  PONV Risk Score and Plan: 2 and Ondansetron, Dexamethasone and Treatment may vary due to age or medical condition  Airway Management Planned: Oral ETT  Additional Equipment:   Intra-op Plan:   Post-operative Plan: Extubation in OR  Informed Consent: I have reviewed the patients History and Physical, chart, labs and discussed the procedure including the risks, benefits and alternatives for the proposed anesthesia with the patient or authorized representative who has indicated his/her understanding and acceptance.     Dental advisory given  Plan Discussed with: CRNA, Anesthesiologist and Surgeon  Anesthesia Plan Comments:        Anesthesia Quick Evaluation

## 2023-10-02 NOTE — Interval H&P Note (Signed)
 History and Physical Interval Note:  10/02/2023 7:33 AM  Andrew Douglas  has presented today for surgery, with the diagnosis of biliary obstruction.  The various methods of treatment have been discussed with the patient and family. After consideration of risks, benefits and other options for treatment, the patient has consented to  Procedure(s): ERCP, WITH INTERVENTION IF INDICATED (N/A) as a surgical intervention.  The patient's history has been reviewed, patient examined, no change in status, stable for surgery.  I have reviewed the patient's chart and labs.  Questions were answered to the patient's satisfaction.     Stan Head

## 2023-10-02 NOTE — Anesthesia Procedure Notes (Signed)
 Procedure Name: Intubation Date/Time: 10/02/2023 8:05 AM  Performed by: Deri Fuelling, CRNAPre-anesthesia Checklist: Patient identified, Emergency Drugs available, Suction available and Patient being monitored Patient Re-evaluated:Patient Re-evaluated prior to induction Oxygen Delivery Method: Circle system utilized Preoxygenation: Pre-oxygenation with 100% oxygen Induction Type: IV induction Ventilation: Mask ventilation without difficulty Laryngoscope Size: Mac and 4 Grade View: Grade I Tube type: Oral Tube size: 7.5 mm Number of attempts: 1 Airway Equipment and Method: Stylet and Oral airway Placement Confirmation: ETT inserted through vocal cords under direct vision, positive ETCO2 and breath sounds checked- equal and bilateral Secured at: 22 cm Tube secured with: Tape Dental Injury: Teeth and Oropharynx as per pre-operative assessment

## 2023-10-02 NOTE — Op Note (Signed)
 [facility] Patient Name: Andrew Douglas Procedure Date: 10/02/2023 MRN: [id number] Attending MD: Iva Boop , MD, [id number] Date of Birth: 06-Dec-1945 CSN: [id number] Age: 78 Admit Type: Inpatient Procedure:                ERCP Indications:              Jaundice - biliary obstruction w/ sludge/? gravel                            in distal CBD on MR/MRCP Providers:                Iva Boop, MD, Eliberto Ivory, RN, Marja Kays, Technician Referring MD:              Medicines:                General Anesthesia, Continuous intermittent                            ampicillin/sulbactam on floor, diclofenac 100 mg                            per rectum Complications:            Tear Estimated Blood Loss:     Estimated blood loss was minimal. Procedure:                Pre-Anesthesia Assessment:                           - Prior to the procedure, a History and Physical                            was performed, and patient medications and                            allergies were reviewed. The patient's tolerance of                            previous anesthesia was also reviewed. The risks                            and benefits of the procedure and the sedation                            options and risks were discussed with the patient.                            All questions were answered, and informed consent                            was obtained. Prior Anticoagulants: The patient has                            taken no anticoagulant or  antiplatelet agents. ASA                            Grade Assessment: II - A patient with mild systemic                            disease. After reviewing the risks and benefits,                            the patient was deemed in satisfactory condition to                            undergo the procedure.                           After obtaining informed consent, the scope was                             passed under direct vision. Throughout the                            procedure, the patient's blood pressure, pulse, and                            oxygen saturations were monitored continuously. The                            [employer] single use                            duodenoscope was introduced through the mouth, The                            procedure was aborted. The scope was not inserted.                            bile ducts Medications were duodenum. The ERCP was                            performed with difficulty due to stomach anatomy.                            The patient tolerated the procedure well. Scope In: Scope Out: Findings:      Exalt duodenoscope inserted - esophagus not seen well w/ side view.       Stomach entered, fundus and body seen, unable to enter distal body and       antrum. 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 Some heme seen in proximal stomach.       Duodenoscope removed and gastroscope inserted. There was some minor       trauma at upper esopahgus with slight heme. There was esophagitis in mid       to distal esophagus. In the cardia and proximal body there was a mucosal       laceration on the greater curve area with evdience of bleeding -  ooze.The distal body, antrum and duodenum loked normal. Some bile was       seen. Impression:               - Mucosal tear on the greater curvature of the                            stomach.                           - LA Grade B reflux esophagitis with no bleeding.                           - Minor superficial mucosal tear in the proximal                            esophagus.                           - ERCP aborted due to mucosal tear of stomach.                            J-shaped stomach encountered. Moderate Sedation:      Not Applicable - Patient had care per Anesthesia. Recommendation:           - Return patient to hospital ward for ongoing care.                           -  Change PPI to pantoprazole 40 mg q12 IV                           - Clear liquid diet today - adbvance as tolerated                            after                           I think would be reasonable to reattempt ERCP in                            2-3 days (defer to Dr. Elnoria Howard) Believe this would                            allow for tears to heal.                           Would start in left lateral decubitis position and                            use gastroscope initially and then try Olympus                            duodenoscope if appropriate Procedure Code(s):        --- Professional ---  43260, 53, Endoscopic retrograde                            cholangiopancreatography (ERCP); diagnostic,                            including collection of specimen(s) by brushing or                            washing, when performed (separate procedure) Diagnosis Code(s):        --- Professional ---                           G29.52WU, Other injury of stomach, initial encounter                           K21.00, Gastro-esophageal reflux disease with                            esophagitis, without bleeding                           S27.818A, Other injury of esophagus (thoracic                            part), initial encounter                           R17, Unspecified jaundice CPT copyright 2022 [employer]. All rights reserved. The codes documented in this report are preliminary and upon coder review may  be revised to meet current compliance requirements. Iva Boop, MD 10/02/2023 8:51:52 AM This report has been signed electronically. Number of Addenda: 0

## 2023-10-02 NOTE — Transfer of Care (Signed)
 Immediate Anesthesia Transfer of Care Note  Patient: Andrew Douglas  Procedure(s) Performed: ERCP, WITH INTERVENTION IF INDICATED ERCP ABORTED CASE  Patient Location: PACU  Anesthesia Type:General  Level of Consciousness: awake and alert   Airway & Oxygen Therapy: Patient Spontanous Breathing and Patient connected to face mask oxygen  Post-op Assessment: Report given to RN and Post -op Vital signs reviewed and stable  Post vital signs: Reviewed and stable  Last Vitals:  Vitals Value Taken Time  BP 125/83 10/02/23 0845  Temp 36.2 C 10/02/23 0841  Pulse 79 10/02/23 0857  Resp 25 10/02/23 0857  SpO2 96 % 10/02/23 0857  Vitals shown include unfiled device data.  Last Pain:  Vitals:   10/02/23 0845  TempSrc:   PainSc: 0-No pain         Complications: No notable events documented.

## 2023-10-02 NOTE — Plan of Care (Signed)

## 2023-10-02 NOTE — Progress Notes (Signed)
 Patient ID: Andrew Douglas, male   DOB: [birth year], 78 y.o.   MRN: [id number]   Results of ERCP noted Will repeat labs in the morning including LFT's Will allow liquids today Will discuss with our acute care surgeon of the week in the morning to consider what the next step will be (surgery vs repeat ERCP)

## 2023-10-02 NOTE — Progress Notes (Signed)
 Mobility Specialist - Progress Note   10/02/23 1418  Mobility  Activity Ambulated independently to bathroom;Ambulated independently in hallway  Level of Assistance Independent  Assistive Device Other (Comment) (IV Pole)  Distance Ambulated (ft) 500 ft  Activity Response Tolerated well  Mobility Referral Yes  Mobility visit 1 Mobility  Mobility Specialist Start Time (ACUTE ONLY) 1334  Mobility Specialist Stop Time (ACUTE ONLY) 1416  Mobility Specialist Time Calculation (min) (ACUTE ONLY) 42 min   Pt received in bed and agreeable to mobility. No complaints during session. Pt to bed after session with all needs met.    [facility]

## 2023-10-02 NOTE — Progress Notes (Signed)
 PROGRESS NOTE    Andrew Douglas  BMW:[id number] DOB: [birth year] DOA: 09/30/2023 PCP: Elias Else, MD   Brief Narrative: 78 year old with past medical history significant for hypertension, treated hepatitis C presents with nausea vomiting found to have biliary obstruction.  Presented with transaminases: Alkaline phosphatase 292, AST 228, ALT 281. CT abdomen Pelvis: Intra and extrahepatic biliary duct dilatation appears new since previous MRI from 2019.   Assessment & Plan:   Principal Problem:   Biliary obstruction  1-Biliary obstruction: Transaminases, Hyperbilirubinemia -Patient presented with nausea vomiting for 6 days prior to admission. -CT abdomen and pelvis show intra and extrahepatic biliary duct dilation new since previous MRI from 2019. Common bile duct tapers relatively abruptly in the head of the pancreas just proximal to the ampulla without obstructing mass lesion or stone evident by CT imaging. -GI consulted recommended MRCP.  -MRCP ordered: Substantial intra and extrahepatic biliary ductal dilatation, common bile duct measuring up to 1.1 cm in caliber. The duct tapers smoothly to the ampulla; there appears to be layering sludge or gravel at the ampulla without discrete calculi. No obstructing mass or other obstructing lesion identified. Mild, diffuse gallbladder wall thickening, concerning for cholecystitis. Enhancing exophytic mural nodule of the anterior medial aspect of the gallbladder wall measuring 1.5 x 0.9 cm, suspicious for gallbladder neoplasm, possibly reflecting very prominent focal adenomyomatosis. -Underwent ERCP 3/23 was aborted, unable to enter distal body of stomach, found to have mucosal laceration stomach and esophagus.  -Surgery consulted due to concern for Cholecystis on MRCP and gallbladder nodule.  -Started IV Unasyn.  Further discussion among sx and GI for next step management.  NPO today. IV fluids.   History of hepatitis C: Treated Hepatitis  panel, hepatitis C antibody reactive  Hypokalemia:  -Replaced   Hypertension: -Continue to hold losartan and hydrochlorothiazide, systolic blood pressure soft       Estimated body mass index is 25.77 kg/m as calculated from the following:   Height as of this encounter: 6\' 1"  (1.854 m).   Weight as of this encounter: 88.6 kg.   DVT prophylaxis: SCD Code Status: Full code Family Communication: Wife and son  at bedside 3/23 Disposition Plan:  Status is: Inpatient Remains inpatient appropriate because: management of transaminases    Consultants:  GI Surgery   Procedures:  MRCP  Antimicrobials:    Subjective: He denies abdominal pain.  He report no further vomiting.    Objective: Vitals:   10/02/23 0845 10/02/23 0900 10/02/23 0917 10/02/23 1401  BP: 125/83 127/86 127/77 130/74  Pulse: 88 81 70 82  Resp: (!) 21 (!) 22 16 20   Temp:  (!) 97.3 F (36.3 C) 98.4 F (36.9 C) 97.7 F (36.5 C)  TempSrc:   Oral Oral  SpO2: 100% 97% 98% 98%  Weight:      Height:        Intake/Output Summary (Last 24 hours) at 10/02/2023 1406 Last data filed at 10/02/2023 2725 Gross per 24 hour  Intake 2666.97 ml  Output --  Net 2666.97 ml   Filed Weights   09/30/23 1333  Weight: 88.6 kg    Examination:  General exam: NAD Respiratory system: CTA Cardiovascular system: S 1, S 2 RRR Gastrointestinal system: BS present, soft, nt Extremities: No edema  Data Reviewed: I have personally reviewed following labs and imaging studies  CBC: Recent Labs  Lab 09/30/23 0617 10/01/23 0521 10/02/23 0504  WBC 11.7* 9.4 7.4  HGB 12.6* 10.9* 10.7*  HCT 40.0 33.6* 33.5*  MCV 88.9 87.7  89.3  PLT 261 240 243   Basic Metabolic Panel: Recent Labs  Lab 09/30/23 0617 09/30/23 1354 10/01/23 0521 10/02/23 0504  NA 138  --  138 139  K 3.1*  --  3.2* 4.2  CL 102  --  106 110  CO2 25  --  24 23  GLUCOSE 137*  --  101* 120*  BUN 19  --  15 14  CREATININE 1.12  --  0.82 0.90   CALCIUM 8.9  --  8.1* 8.0*  MG  --  2.0  --   --    GFR: Estimated Creatinine Clearance: 76.4 mL/min (by C-G formula based on SCr of 0.9 mg/dL). Liver Function Tests: Recent Labs  Lab 09/30/23 0617 10/01/23 0521 10/02/23 0504  AST 228* 123* 110*  ALT 281* 195* 171*  ALKPHOS 292* 246* 223*  BILITOT 6.6* 7.0* 5.1*  PROT 7.1 6.3* 5.8*  ALBUMIN 3.3* 2.7* 2.4*   Recent Labs  Lab 09/30/23 0617  LIPASE 20   No results for input(s): "AMMONIA" in the last 168 hours. Coagulation Profile: Recent Labs  Lab 10/01/23 1329  INR 1.2   Cardiac Enzymes: No results for input(s): "CKTOTAL", "CKMB", "CKMBINDEX", "TROPONINI" in the last 168 hours. BNP (last 3 results) No results for input(s): "PROBNP" in the last 8760 hours. HbA1C: No results for input(s): "HGBA1C" in the last 72 hours. CBG: No results for input(s): "GLUCAP" in the last 168 hours. Lipid Profile: No results for input(s): "CHOL", "HDL", "LDLCALC", "TRIG", "CHOLHDL", "LDLDIRECT" in the last 72 hours. Thyroid Function Tests: No results for input(s): "TSH", "T4TOTAL", "FREET4", "T3FREE", "THYROIDAB" in the last 72 hours. Anemia Panel: No results for input(s): "VITAMINB12", "FOLATE", "FERRITIN", "TIBC", "IRON", "RETICCTPCT" in the last 72 hours. Sepsis Labs: Recent Labs  Lab 09/30/23 5784  LATICACIDVEN 1.4    Recent Results (from the past 240 hours)  Resp panel by RT-PCR (RSV, Flu A&B, Covid) Anterior Nasal Swab     Status: None   Collection Time: 09/30/23  6:13 AM   Specimen: Anterior Nasal Swab  Result Value Ref Range Status   SARS Coronavirus 2 by RT PCR NEGATIVE NEGATIVE Final    Comment: (NOTE) SARS-CoV-2 target nucleic acids are NOT DETECTED.  The SARS-CoV-2 RNA is generally detectable in upper respiratory specimens during the acute phase of infection. The lowest concentration of SARS-CoV-2 viral copies this assay can detect is 138 copies/mL. A negative result does not preclude SARS-Cov-2 infection and  should not be used as the sole basis for treatment or other patient management decisions. A negative result may occur with  improper specimen collection/handling, submission of specimen other than nasopharyngeal swab, presence of viral mutation(s) within the areas targeted by this assay, and inadequate number of viral copies(<138 copies/mL). A negative result must be combined with clinical observations, patient history, and epidemiological information. The expected result is Negative.  Fact Sheet for Patients:  BloggerCourse.com  Fact Sheet for Healthcare Providers:  SeriousBroker.it  This test is no t yet approved or cleared by the [place] FDA and  has been authorized for detection and/or diagnosis of SARS-CoV-2 by FDA under an Emergency Use Authorization (EUA). This EUA will remain  in effect (meaning this test can be used) for the duration of the COVID-19 declaration under Section 564(b)(1) of the Act, 21 U.S.C.section 360bbb-3(b)(1), unless the authorization is terminated  or revoked sooner.       Influenza A by PCR NEGATIVE NEGATIVE Final   Influenza B by PCR NEGATIVE NEGATIVE Final  Comment: (NOTE) The Xpert Xpress SARS-CoV-2/FLU/RSV plus assay is intended as an aid in the diagnosis of influenza from Nasopharyngeal swab specimens and should not be used as a sole basis for treatment. Nasal washings and aspirates are unacceptable for Xpert Xpress SARS-CoV-2/FLU/RSV testing.  Fact Sheet for Patients: BloggerCourse.com  Fact Sheet for Healthcare Providers: SeriousBroker.it  This test is not yet approved or cleared by the [place] FDA and has been authorized for detection and/or diagnosis of SARS-CoV-2 by FDA under an Emergency Use Authorization (EUA). This EUA will remain in effect (meaning this test can be used) for the duration of the COVID-19 declaration  under Section 564(b)(1) of the Act, 21 U.S.C. section 360bbb-3(b)(1), unless the authorization is terminated or revoked.     Resp Syncytial Virus by PCR NEGATIVE NEGATIVE Final    Comment: (NOTE) Fact Sheet for Patients: BloggerCourse.com  Fact Sheet for Healthcare Providers: SeriousBroker.it  This test is not yet approved or cleared by the [place] FDA and has been authorized for detection and/or diagnosis of SARS-CoV-2 by FDA under an Emergency Use Authorization (EUA). This EUA will remain in effect (meaning this test can be used) for the duration of the COVID-19 declaration under Section 564(b)(1) of the Act, 21 U.S.C. section 360bbb-3(b)(1), unless the authorization is terminated or revoked.  Performed at [facility], [address], [place], [place] [address]          Radiology Studies: DG ERCP Result Date: 10/02/2023 CLINICAL DATA:  Attempted but aborted ERCP.  Biliary stenosis. EXAM: ERCP TECHNIQUE: Multiple spot images obtained with the fluoroscopic device and submitted for interpretation post-procedure. FLUOROSCOPY: Radiation Exposure Index (as provided by the fluoroscopic device): 0.23 mGy Kerma COMPARISON:  MRCP 10/01/2023 FINDINGS: A single saved intra procedural images submitted for review. The image demonstrates the patient's right upper quadrant. No medical devices are visualized. Per notes, the procedure was aborted. IMPRESSION: Aborted ERCP. These images were submitted for radiologic interpretation only. Please see the procedural report for the amount of contrast and the fluoroscopy time utilized. Electronically Signed   By: Malachy Moan M.D.   On: 10/02/2023 09:09   MR ABDOMEN MRCP W WO CONTAST Result Date: 10/01/2023 CLINICAL DATA:  Biliary obstruction suspected, biliary ductal dilatation identified by prior CT nausea and vomiting EXAM: MRI ABDOMEN WITHOUT AND WITH CONTRAST (INCLUDING  MRCP) TECHNIQUE: Multiplanar multisequence MR imaging of the abdomen was performed both before and after the administration of intravenous contrast. Heavily T2-weighted images of the biliary and pancreatic ducts were obtained, and three-dimensional MRCP images were rendered by post processing. CONTRAST:  8mL GADAVIST GADOBUTROL 1 MMOL/ML IV SOLN COMPARISON:  CT abdomen pelvis, 09/30/2023 FINDINGS: Lower chest: No acute abnormality. Hepatobiliary: No solid liver abnormality is seen. Substantial intra and extrahepatic biliary ductal dilatation, common bile duct measuring up to 1.1 cm in caliber. The duct tapers smoothly to the ampulla; there appears to be layering sludge or gravel at the ampulla without discrete calculi (series 3, image 18, series 4, image 27). Mild gallbladder wall thickening. Enhancing exophytic mural nodule of the anterior medial aspect of the gallbladder wall measuring 1.5 x 0.9 cm (series 13, image 48). Pancreas: Unremarkable. No pancreatic ductal dilatation or surrounding inflammatory changes. Spleen: Normal in size without significant abnormality. Adrenals/Urinary Tract: Adrenal glands are unremarkable. Simple, benign cyst of the superior pole of the right kidney, requiring no specific further follow-up or characterization. Kidneys are otherwise normal, without obvious renal calculi, solid lesion, or hydronephrosis. Stomach/Bowel: Stomach is within normal limits.  Descending periampullary duodenal diverticulum. No evidence of bowel wall thickening, distention, or inflammatory changes. Pancolonic diverticulosis. Vascular/Lymphatic: No significant vascular findings are present. No enlarged abdominal lymph nodes. Other: No abdominal wall hernia or abnormality. No ascites. Musculoskeletal: No acute or significant osseous findings. IMPRESSION: 1. Substantial intra and extrahepatic biliary ductal dilatation, common bile duct measuring up to 1.1 cm in caliber. The duct tapers smoothly to the ampulla;  there appears to be layering sludge or gravel at the ampulla without discrete calculi. No obstructing mass or other obstructing lesion identified. 2. Mild, diffuse gallbladder wall thickening, concerning for cholecystitis. 3. Enhancing exophytic mural nodule of the anterior medial aspect of the gallbladder wall measuring 1.5 x 0.9 cm, suspicious for gallbladder neoplasm, possibly reflecting very prominent focal adenomyomatosis. These results will be called to the ordering clinician or representative by the Radiologist Assistant, and communication documented in the PACS or [employer]. Electronically Signed   By: Jearld Lesch M.D.   On: 10/01/2023 09:42   MR 3D Recon At Scanner Result Date: 10/01/2023 CLINICAL DATA:  Biliary obstruction suspected, biliary ductal dilatation identified by prior CT nausea and vomiting EXAM: MRI ABDOMEN WITHOUT AND WITH CONTRAST (INCLUDING MRCP) TECHNIQUE: Multiplanar multisequence MR imaging of the abdomen was performed both before and after the administration of intravenous contrast. Heavily T2-weighted images of the biliary and pancreatic ducts were obtained, and three-dimensional MRCP images were rendered by post processing. CONTRAST:  8mL GADAVIST GADOBUTROL 1 MMOL/ML IV SOLN COMPARISON:  CT abdomen pelvis, 09/30/2023 FINDINGS: Lower chest: No acute abnormality. Hepatobiliary: No solid liver abnormality is seen. Substantial intra and extrahepatic biliary ductal dilatation, common bile duct measuring up to 1.1 cm in caliber. The duct tapers smoothly to the ampulla; there appears to be layering sludge or gravel at the ampulla without discrete calculi (series 3, image 18, series 4, image 27). Mild gallbladder wall thickening. Enhancing exophytic mural nodule of the anterior medial aspect of the gallbladder wall measuring 1.5 x 0.9 cm (series 13, image 48). Pancreas: Unremarkable. No pancreatic ductal dilatation or surrounding inflammatory changes. Spleen: Normal in size  without significant abnormality. Adrenals/Urinary Tract: Adrenal glands are unremarkable. Simple, benign cyst of the superior pole of the right kidney, requiring no specific further follow-up or characterization. Kidneys are otherwise normal, without obvious renal calculi, solid lesion, or hydronephrosis. Stomach/Bowel: Stomach is within normal limits. Descending periampullary duodenal diverticulum. No evidence of bowel wall thickening, distention, or inflammatory changes. Pancolonic diverticulosis. Vascular/Lymphatic: No significant vascular findings are present. No enlarged abdominal lymph nodes. Other: No abdominal wall hernia or abnormality. No ascites. Musculoskeletal: No acute or significant osseous findings. IMPRESSION: 1. Substantial intra and extrahepatic biliary ductal dilatation, common bile duct measuring up to 1.1 cm in caliber. The duct tapers smoothly to the ampulla; there appears to be layering sludge or gravel at the ampulla without discrete calculi. No obstructing mass or other obstructing lesion identified. 2. Mild, diffuse gallbladder wall thickening, concerning for cholecystitis. 3. Enhancing exophytic mural nodule of the anterior medial aspect of the gallbladder wall measuring 1.5 x 0.9 cm, suspicious for gallbladder neoplasm, possibly reflecting very prominent focal adenomyomatosis. These results will be called to the ordering clinician or representative by the Radiologist Assistant, and communication documented in the PACS or [employer]. Electronically Signed   By: Jearld Lesch M.D.   On: 10/01/2023 09:42        Scheduled Meds:  pantoprazole (PROTONIX) IV  40 mg Intravenous Q12H   Continuous Infusions:  ampicillin-sulbactam (UNASYN) IV 3 g (10/02/23  1126)   dextrose 75 mL/hr at 10/02/23 1219     LOS: 2 days    Time spent: 35 minutes.     Alba Cory, MD [facility]   If 7PM-7AM, please contact night-coverage [email or web address]  10/02/2023, 2:06 PM

## 2023-10-03 ENCOUNTER — Inpatient Hospital Stay (HOSPITAL_COMMUNITY): Admitting: Anesthesiology

## 2023-10-03 ENCOUNTER — Other Ambulatory Visit: Payer: Self-pay

## 2023-10-03 ENCOUNTER — Encounter (HOSPITAL_COMMUNITY): Payer: Self-pay | Admitting: Internal Medicine

## 2023-10-03 ENCOUNTER — Encounter (HOSPITAL_COMMUNITY)
Admission: EM | Disposition: A | Discharge status: Home / Self Care | Payer: Self-pay | Source: Home / Self Care | Attending: Internal Medicine

## 2023-10-03 ENCOUNTER — Inpatient Hospital Stay (HOSPITAL_COMMUNITY)

## 2023-10-03 DIAGNOSIS — K8000 Calculus of gallbladder with acute cholecystitis without obstruction: Secondary | ICD-10-CM | POA: Diagnosis not present

## 2023-10-03 DIAGNOSIS — I10 Essential (primary) hypertension: Secondary | ICD-10-CM | POA: Diagnosis not present

## 2023-10-03 DIAGNOSIS — K831 Obstruction of bile duct: Secondary | ICD-10-CM | POA: Diagnosis not present

## 2023-10-03 HISTORY — PX: CHOLECYSTECTOMY: SHX55

## 2023-10-03 LAB — COMPREHENSIVE METABOLIC PANEL
ALT: 142 U/L — ABNORMAL HIGH (ref 0–44)
AST: 67 U/L — ABNORMAL HIGH (ref 15–41)
Albumin: 2.6 g/dL — ABNORMAL LOW (ref 3.5–5.0)
Alkaline Phosphatase: 210 U/L — ABNORMAL HIGH (ref 38–126)
Anion gap: 6 (ref 5–15)
BUN: 10 mg/dL (ref 8–23)
CO2: 23 mmol/L (ref 22–32)
Calcium: 8.3 mg/dL — ABNORMAL LOW (ref 8.9–10.3)
Chloride: 110 mmol/L (ref 98–111)
Creatinine, Ser: 0.64 mg/dL (ref 0.61–1.24)
GFR, Estimated: >60 mL/min (ref >60)
Glucose, Bld: 131 mg/dL — ABNORMAL HIGH (ref 70–99)
Potassium: 4.1 mmol/L (ref 3.5–5.1)
Sodium: 139 mmol/L (ref 135–145)
Total Bilirubin: 2.5 mg/dL — ABNORMAL HIGH (ref 0.0–1.2)
Total Protein: 6 g/dL — ABNORMAL LOW (ref 6.5–8.1)

## 2023-10-03 LAB — CBC
HCT: 34.1 % — ABNORMAL LOW (ref 39.0–52.0)
Hemoglobin: 10.8 g/dL — ABNORMAL LOW (ref 13.0–17.0)
MCH: 28.5 pg (ref 26.0–34.0)
MCHC: 31.7 g/dL (ref 30.0–36.0)
MCV: 90 fL (ref 80.0–100.0)
Platelets: 273 10*3/uL (ref 150–400)
RBC: 3.79 MIL/uL — ABNORMAL LOW (ref 4.22–5.81)
RDW: 13.7 % (ref 11.5–15.5)
WBC: 8.8 10*3/uL (ref 4.0–10.5)
nRBC: 0 % (ref 0.0–0.2)

## 2023-10-03 SURGERY — LAPAROSCOPIC CHOLECYSTECTOMY WITH INTRAOPERATIVE CHOLANGIOGRAM
Anesthesia: General | Site: Abdomen

## 2023-10-03 MED ORDER — ROCURONIUM BROMIDE 10 MG/ML (PF) SYRINGE
PREFILLED_SYRINGE | INTRAVENOUS | Status: AC
  Filled 2023-10-03: qty 10

## 2023-10-03 MED ORDER — SUGAMMADEX SODIUM 200 MG/2ML IV SOLN
INTRAVENOUS | Status: AC
  Filled 2023-10-03: qty 2

## 2023-10-03 MED ORDER — CHLORHEXIDINE GLUCONATE CLOTH 2 % EX PADS: 6.0000 | MEDICATED_PAD | Freq: Once | CUTANEOUS | Status: DC

## 2023-10-03 MED ORDER — PROPOFOL 10 MG/ML IV BOLUS
INTRAVENOUS | Status: AC
  Filled 2023-10-03: qty 20

## 2023-10-03 MED ORDER — CEFAZOLIN SODIUM-DEXTROSE 2-4 GM/100ML-% IV SOLN
2.0000 g | INTRAVENOUS | Status: AC
  Administered 2023-10-03: 2 g via INTRAVENOUS
  Filled 2023-10-03: qty 100

## 2023-10-03 MED ORDER — LACTATED RINGERS IV SOLN: INTRAVENOUS | Status: DC

## 2023-10-03 MED ORDER — LIDOCAINE HCL (PF) 2 % IJ SOLN
INTRAMUSCULAR | Status: DC | PRN
Start: 2023-10-03 — End: 2023-10-03
  Administered 2023-10-03: 80 mg via INTRADERMAL

## 2023-10-03 MED ORDER — LACTATED RINGERS IV SOLN
INTRAVENOUS | Status: AC | PRN
Start: 2023-10-03 — End: 2023-10-03
  Administered 2023-10-03: 1000 mL

## 2023-10-03 MED ORDER — INDOCYANINE GREEN 25 MG IV SOLR
7.5000 mg | Freq: Once | INTRAVENOUS | Status: AC
  Administered 2023-10-03: 7.5 mg via INTRAVENOUS
  Filled 2023-10-03: qty 10

## 2023-10-03 MED ORDER — CHLORHEXIDINE GLUCONATE 0.12 % MT SOLN
15.0000 mL | Freq: Once | OROMUCOSAL | Status: AC
  Administered 2023-10-03: 15 mL via OROMUCOSAL

## 2023-10-03 MED ORDER — GLUCAGON HCL RDNA (DIAGNOSTIC) 1 MG IJ SOLR
INTRAMUSCULAR | Status: AC
  Filled 2023-10-03: qty 1

## 2023-10-03 MED ORDER — LACTATED RINGERS IV SOLN: INTRAVENOUS | Status: AC

## 2023-10-03 MED ORDER — DEXAMETHASONE SODIUM PHOSPHATE 10 MG/ML IJ SOLN
INTRAMUSCULAR | Status: DC | PRN
  Administered 2023-10-03: 8 mg via INTRAVENOUS

## 2023-10-03 MED ORDER — GLUCAGON HCL RDNA (DIAGNOSTIC) 1 MG IJ SOLR
INTRAMUSCULAR | Status: DC | PRN
  Administered 2023-10-03: 1 mg via INTRAVENOUS

## 2023-10-03 MED ORDER — SUCCINYLCHOLINE CHLORIDE 200 MG/10ML IV SOSY
PREFILLED_SYRINGE | INTRAVENOUS | Status: AC
  Filled 2023-10-03: qty 10

## 2023-10-03 MED ORDER — PROPOFOL 10 MG/ML IV BOLUS
INTRAVENOUS | Status: DC | PRN
  Administered 2023-10-03: 130 mg via INTRAVENOUS

## 2023-10-03 MED ORDER — PHENYLEPHRINE 80 MCG/ML (10ML) SYRINGE FOR IV PUSH (FOR BLOOD PRESSURE SUPPORT)
PREFILLED_SYRINGE | INTRAVENOUS | Status: DC | PRN
  Administered 2023-10-03 (×2): 80 ug via INTRAVENOUS

## 2023-10-03 MED ORDER — ROCURONIUM BROMIDE 10 MG/ML (PF) SYRINGE
PREFILLED_SYRINGE | INTRAVENOUS | Status: DC | PRN
  Administered 2023-10-03: 60 mg via INTRAVENOUS

## 2023-10-03 MED ORDER — SUGAMMADEX SODIUM 200 MG/2ML IV SOLN
INTRAVENOUS | Status: DC | PRN
  Administered 2023-10-03: 200 mg via INTRAVENOUS

## 2023-10-03 MED ORDER — BUPIVACAINE-EPINEPHRINE 0.25% -1:200000 IJ SOLN
INTRAMUSCULAR | Status: DC | PRN
  Administered 2023-10-03: 10 mL

## 2023-10-03 MED ORDER — FENTANYL CITRATE (PF) 100 MCG/2ML IJ SOLN
INTRAMUSCULAR | Status: AC
Start: 2023-10-03 — End: ?
  Filled 2023-10-03: qty 2

## 2023-10-03 MED ORDER — ONDANSETRON HCL 4 MG/2ML IJ SOLN: 4.0000 mg | Freq: Once | INTRAMUSCULAR | Status: DC | PRN

## 2023-10-03 MED ORDER — IOPAMIDOL (ISOVUE-300) INJECTION 61%
INTRAVENOUS | Status: DC | PRN
  Administered 2023-10-03: 15 mL

## 2023-10-03 MED ORDER — FENTANYL CITRATE PF 50 MCG/ML IJ SOSY: 25.0000 ug | PREFILLED_SYRINGE | INTRAMUSCULAR | Status: DC | PRN

## 2023-10-03 MED ORDER — OXYCODONE HCL 5 MG PO TABS
10.0000 mg | ORAL_TABLET | Freq: Four times a day (QID) | ORAL | Status: DC | PRN
  Administered 2023-10-03 – 2023-10-04 (×3): 10 mg via ORAL
  Filled 2023-10-03 (×3): qty 2

## 2023-10-03 MED ORDER — BUPIVACAINE-EPINEPHRINE (PF) 0.25% -1:200000 IJ SOLN
INTRAMUSCULAR | Status: AC
  Filled 2023-10-03: qty 30

## 2023-10-03 SURGICAL SUPPLY — 26 items
APPLIER CLIP ROT 10 11.4 M/L (STAPLE) ×1 IMPLANT
CHLORAPREP W/TINT 26 (MISCELLANEOUS) ×1 IMPLANT
CLIP APPLIE ROT 10 11.4 M/L (STAPLE) ×1 IMPLANT
COVER MAYO STAND XLG (MISCELLANEOUS) IMPLANT
DERMABOND ADVANCED .7 DNX12 (GAUZE/BANDAGES/DRESSINGS) ×1 IMPLANT
DRAPE C-ARM 42X120 X-RAY (DRAPES) IMPLANT
ELECT REM PT RETURN 15FT ADLT (MISCELLANEOUS) ×1 IMPLANT
GLOVE INDICATOR 8.0 STRL GRN (GLOVE) ×1 IMPLANT
GLOVE SS BIOGEL STRL SZ 8 (GLOVE) ×1 IMPLANT
GOWN STRL REUS W/ TWL XL LVL3 (GOWN DISPOSABLE) ×2 IMPLANT
HEMOSTAT SURGICEL 4X8 (HEMOSTASIS) IMPLANT
IRRIG SUCT STRYKERFLOW 2 WTIP (MISCELLANEOUS) ×1 IMPLANT
IRRIGATION SUCT STRKRFLW 2 WTP (MISCELLANEOUS) ×1 IMPLANT
KIT BASIN OR (CUSTOM PROCEDURE TRAY) ×1 IMPLANT
KIT TURNOVER KIT A (KITS) IMPLANT
SCISSORS LAP 5X35 DISP (ENDOMECHANICALS) ×1 IMPLANT
SET CHOLANGIOGRAPH MIX (MISCELLANEOUS) IMPLANT
SET TUBE SMOKE EVAC HIGH FLOW (TUBING) ×1 IMPLANT
SLEEVE Z-THREAD 5X100MM (TROCAR) ×1 IMPLANT
SPIKE FLUID TRANSFER (MISCELLANEOUS) ×1 IMPLANT
SUT MNCRL AB 4-0 PS2 18 (SUTURE) ×1 IMPLANT
TOWEL OR 17X26 10 PK STRL BLUE (TOWEL DISPOSABLE) ×1 IMPLANT
TRAY LAPAROSCOPIC (CUSTOM PROCEDURE TRAY) ×1 IMPLANT
TROCAR 11X100 Z THREAD (TROCAR) ×1 IMPLANT
TROCAR BALLN 12MMX100 BLUNT (TROCAR) ×1 IMPLANT
TROCAR Z-THREAD OPTICAL 5X100M (TROCAR) ×1 IMPLANT

## 2023-10-03 NOTE — Progress Notes (Signed)
   10/03/23 1036  TOC Brief Assessment  Insurance and Status Reviewed  Patient has primary care physician Yes  Home environment has been reviewed resides in private residence with spouse  Prior level of function: Independent  Prior/Current Home Services No current home services  Social Drivers of Health Review SDOH reviewed no interventions necessary  Readmission risk has been reviewed Yes  Transition of care needs no transition of care needs at this time

## 2023-10-03 NOTE — Anesthesia Procedure Notes (Signed)
 Procedure Name: Intubation Date/Time: 10/03/2023 9:27 AM  Performed by: Randa Evens, CRNAPre-anesthesia Checklist: Patient identified, Emergency Drugs available, Suction available and Patient being monitored Patient Re-evaluated:Patient Re-evaluated prior to induction Oxygen Delivery Method: Circle System Utilized Preoxygenation: Pre-oxygenation with 100% oxygen Induction Type: IV induction Ventilation: Mask ventilation without difficulty Laryngoscope Size: Mac and 4 Grade View: Grade I Tube type: Oral Tube size: 7.5 mm Number of attempts: 1 Airway Equipment and Method: Stylet and Oral airway Placement Confirmation: ETT inserted through vocal cords under direct vision, positive ETCO2 and breath sounds checked- equal and bilateral Secured at: 22 cm Tube secured with: Tape Dental Injury: Teeth and Oropharynx as per pre-operative assessment

## 2023-10-03 NOTE — Progress Notes (Signed)
 [facility] Gastroenterology Progress Note  Izayiah Tibbitts 78 y.o. Dec 16, 1945   Subjective: S/P lap chole today. Feels ok. Wife in room.  Objective: Vital signs: Vitals:   10/03/23 1130 10/03/23 1209  BP: (!) 164/89 (!) 166/88  Pulse: 62 61  Resp: 15 15  Temp:  98.2 F (36.8 C)  SpO2: 99% 97%    Physical Exam: Gen: lethargic, elderly, well-nourished, no acute distress, pleasant HEENT: anicteric sclera CV: RRR Chest: CTA B Abd: minimal tenderness at port sites, mild distention, quiet bowel sounds Ext: no edema  Lab Results: Recent Labs    09/30/23 1354 10/01/23 0521 10/02/23 0504 10/03/23 0437  NA  --    < > 139 139  K  --    < > 4.2 4.1  CL  --    < > 110 110  CO2  --    < > 23 23  GLUCOSE  --    < > 120* 131*  BUN  --    < > 14 10  CREATININE  --    < > 0.90 0.64  CALCIUM  --    < > 8.0* 8.3*  MG 2.0  --   --   --    < > = values in this interval not displayed.   Recent Labs    10/02/23 0504 10/03/23 0437  AST 110* 67*  ALT 171* 142*  ALKPHOS 223* 210*  BILITOT 5.1* 2.5*  PROT 5.8* 6.0*  ALBUMIN 2.4* 2.6*   Recent Labs    10/02/23 0504 10/03/23 0437  WBC 7.4 8.8  HGB 10.7* 10.8*  HCT 33.5* 34.1*  MCV 89.3 90.0  PLT 243 273      Assessment/Plan: Cholecystitis with cholelithiasis and concern for choledocholithiasis with IOC with minimal dye reaching duodenum and significant back pressure during IOC suggesting distal CBD stone on operative report (official read of cholangiogram pending). ERCP risks/benefits discussed with patient and wife and they agree to proceed. Diet per surgery today. NPO p MN. ERCP scheduled for 1230pm tomorrow with Dr. Ewing Schlein.   Shirley Friar 10/03/2023, 12:50 PM  Questions please call [phone] ID: Trashaun Streight, male   DOB: 1945/10/18, 78 y.o.   MRN: [id number]

## 2023-10-03 NOTE — Anesthesia Postprocedure Evaluation (Signed)
 Anesthesia Post Note  Patient: Andrew Douglas  Procedure(s) Performed: LAPAROSCOPIC CHOLECYSTECTOMY WITH ICG DYE AND  INTRAOPERATIVE CHOLANGIOGRAM (Abdomen)     Patient location during evaluation: Nursing Unit Anesthesia Type: General Level of consciousness: awake and alert Pain management: pain level controlled Vital Signs Assessment: post-procedure vital signs reviewed and stable Respiratory status: spontaneous breathing, nonlabored ventilation and respiratory function stable Cardiovascular status: blood pressure returned to baseline and stable Postop Assessment: no apparent nausea or vomiting Anesthetic complications: no   No notable events documented.  Last Vitals:  Vitals:   10/03/23 1323 10/03/23 1507  BP: 130/86 111/78  Pulse: 66 76  Resp:    Temp: 36.5 C 36.8 C  SpO2: 97% 97%    Last Pain:  Vitals:   10/03/23 1611  TempSrc:   PainSc: 0-No pain                 Collene Schlichter

## 2023-10-03 NOTE — Anesthesia Preprocedure Evaluation (Addendum)
 Anesthesia Evaluation  Patient identified by MRN, date of birth, ID band Patient awake    Reviewed: Allergy & Precautions, NPO status , Patient's Chart, lab work & pertinent test results  Airway Mallampati: II  TM Distance: >3 FB Neck ROM: Full    Dental  (+) Dental Advisory Given, Missing, Caps   Pulmonary former smoker   Pulmonary exam normal breath sounds clear to auscultation       Cardiovascular hypertension, Pt. on medications Normal cardiovascular exam Rhythm:Regular Rate:Normal     Neuro/Psych negative neurological ROS  negative psych ROS   GI/Hepatic ,GERD  Medicated,,(+) Hepatitis -, CGALLSTONES   Endo/Other  negative endocrine ROS    Renal/GU negative Renal ROS     Musculoskeletal negative musculoskeletal ROS (+)    Abdominal   Peds  Hematology  (+) Blood dyscrasia, anemia   Anesthesia Other Findings Day of surgery medications reviewed with the patient.  Reproductive/Obstetrics                             Anesthesia Physical Anesthesia Plan  ASA: 3  Anesthesia Plan: General   Post-op Pain Management: Ofirmev IV (intra-op)* and Toradol IV (intra-op)*   Induction: Intravenous  PONV Risk Score and Plan: 3 and Dexamethasone and Ondansetron  Airway Management Planned: Oral ETT  Additional Equipment:   Intra-op Plan:   Post-operative Plan: Extubation in OR  Informed Consent: I have reviewed the patients History and Physical, chart, labs and discussed the procedure including the risks, benefits and alternatives for the proposed anesthesia with the patient or authorized representative who has indicated his/her understanding and acceptance.     Dental advisory given  Plan Discussed with: CRNA  Anesthesia Plan Comments:        Anesthesia Quick Evaluation

## 2023-10-03 NOTE — Progress Notes (Signed)
 PROGRESS NOTE    Andrew Douglas  JYN:[id number] DOB: 1946-03-25 DOA: 09/30/2023 PCP: Elias Else, MD   Brief Narrative: 78 year old with past medical history significant for hypertension, treated hepatitis C presents with nausea vomiting found to have biliary obstruction.  Presented with transaminases: Alkaline phosphatase 292, AST 228, ALT 281. CT abdomen Pelvis: Intra and extrahepatic biliary duct dilatation appears new since previous MRI from 2019.   Assessment & Plan:   Principal Problem:   Biliary obstruction  1-Biliary obstruction, Cholecystitis, Choledocholithiasis.   Transaminases, Hyperbilirubinemia -Patient presented with nausea vomiting for 6 days prior to admission. -CT abdomen and pelvis show intra and extrahepatic biliary duct dilation new since previous MRI from 2019. Common bile duct tapers relatively abruptly in the head of the pancreas just proximal to the ampulla without obstructing mass lesion or stone evident by CT imaging. -GI consulted recommended MRCP.  -MRCP:  Substantial intra and extrahepatic biliary ductal dilatation, common bile duct measuring up to 1.1 cm in caliber. The duct tapers smoothly to the ampulla; there appears to be layering sludge or gravel at the ampulla without discrete calculi. No obstructing mass or other obstructing lesion identified. Mild, diffuse gallbladder wall thickening, concerning for cholecystitis. Enhancing exophytic mural nodule of the anterior medial aspect of the gallbladder wall measuring 1.5 x 0.9 cm, suspicious for gallbladder neoplasm, possibly reflecting very prominent focal adenomyomatosis. -Underwent ERCP 3/23 was aborted, unable to enter distal body of stomach, found to have mucosal laceration stomach and esophagus.  -Surgery consulted due to concern for Cholecystis on MRCP and gallbladder nodule.  -Continue with  IV Unasyn.  -Underwent Cholecystectomy 3/24. Diet per sx. IOC suggesting distal CBD. GI planning ERCP  tomorrow.  LFT trending down.   History of hepatitis C: Treated Hepatitis panel, hepatitis C antibody reactive  Hypokalemia:  -Replaced   Hypertension: -Continue to hold losartan and hydrochlorothiazide, systolic blood pressure soft       Estimated body mass index is 25.77 kg/m as calculated from the following:   Height as of this encounter: 6\' 1"  (1.854 m).   Weight as of this encounter: 88.6 kg.   DVT prophylaxis: SCD Code Status: Full code Family Communication: Wife and son  at bedside 3/24 Disposition Plan:  Status is: Inpatient Remains inpatient appropriate because: management of transaminases    Consultants:  GI Surgery   Procedures:  MRCP  Antimicrobials:    Subjective: He just came from Sx. Needs pain medications.    Objective: Vitals:   10/03/23 1100 10/03/23 1115 10/03/23 1130 10/03/23 1209  BP: (!) 138/96 (!) 166/90 (!) 164/89 (!) 166/88  Pulse: 73 63 62 61  Resp: 17 14 15 15   Temp:    98.2 F (36.8 C)  TempSrc:    Oral  SpO2: 95% 99% 99% 97%  Weight:      Height:        Intake/Output Summary (Last 24 hours) at 10/03/2023 1320 Last data filed at 10/03/2023 1056 Gross per 24 hour  Intake 3916.79 ml  Output 820 ml  Net 3096.79 ml   Filed Weights   09/30/23 1333 10/03/23 0846  Weight: 88.6 kg 88.6 kg    Examination:  General exam: NAD Respiratory system:  CTA Cardiovascular system: S 1, S 2 RRR Gastrointestinal system:BS present, soft, mild tender, multiples small incision glue.  Extremities: No edema  Data Reviewed: I have personally reviewed following labs and imaging studies  CBC: Recent Labs  Lab 09/30/23 0617 10/01/23 0521 10/02/23 0504 10/03/23 0437  WBC 11.7* 9.4  7.4 8.8  HGB 12.6* 10.9* 10.7* 10.8*  HCT 40.0 33.6* 33.5* 34.1*  MCV 88.9 87.7 89.3 90.0  PLT 261 240 243 273   Basic Metabolic Panel: Recent Labs  Lab 09/30/23 0617 09/30/23 1354 10/01/23 0521 10/02/23 0504 10/03/23 0437  NA 138  --  138  139 139  K 3.1*  --  3.2* 4.2 4.1  CL 102  --  106 110 110  CO2 25  --  24 23 23   GLUCOSE 137*  --  101* 120* 131*  BUN 19  --  15 14 10   CREATININE 1.12  --  0.82 0.90 0.64  CALCIUM 8.9  --  8.1* 8.0* 8.3*  MG  --  2.0  --   --   --    GFR: Estimated Creatinine Clearance: 86 mL/min (by C-G formula based on SCr of 0.64 mg/dL). Liver Function Tests: Recent Labs  Lab 09/30/23 0617 10/01/23 0521 10/02/23 0504 10/03/23 0437  AST 228* 123* 110* 67*  ALT 281* 195* 171* 142*  ALKPHOS 292* 246* 223* 210*  BILITOT 6.6* 7.0* 5.1* 2.5*  PROT 7.1 6.3* 5.8* 6.0*  ALBUMIN 3.3* 2.7* 2.4* 2.6*   Recent Labs  Lab 09/30/23 0617  LIPASE 20   No results for input(s): "AMMONIA" in the last 168 hours. Coagulation Profile: Recent Labs  Lab 10/01/23 1329  INR 1.2   Cardiac Enzymes: No results for input(s): "CKTOTAL", "CKMB", "CKMBINDEX", "TROPONINI" in the last 168 hours. BNP (last 3 results) No results for input(s): "PROBNP" in the last 8760 hours. HbA1C: No results for input(s): "HGBA1C" in the last 72 hours. CBG: No results for input(s): "GLUCAP" in the last 168 hours. Lipid Profile: No results for input(s): "CHOL", "HDL", "LDLCALC", "TRIG", "CHOLHDL", "LDLDIRECT" in the last 72 hours. Thyroid Function Tests: No results for input(s): "TSH", "T4TOTAL", "FREET4", "T3FREE", "THYROIDAB" in the last 72 hours. Anemia Panel: No results for input(s): "VITAMINB12", "FOLATE", "FERRITIN", "TIBC", "IRON", "RETICCTPCT" in the last 72 hours. Sepsis Labs: Recent Labs  Lab 09/30/23 4098  LATICACIDVEN 1.4    Recent Results (from the past 240 hours)  Resp panel by RT-PCR (RSV, Flu A&B, Covid) Anterior Nasal Swab     Status: None   Collection Time: 09/30/23  6:13 AM   Specimen: Anterior Nasal Swab  Result Value Ref Range Status   SARS Coronavirus 2 by RT PCR NEGATIVE NEGATIVE Final    Comment: (NOTE) SARS-CoV-2 target nucleic acids are NOT DETECTED.  The SARS-CoV-2 RNA is generally  detectable in upper respiratory specimens during the acute phase of infection. The lowest concentration of SARS-CoV-2 viral copies this assay can detect is 138 copies/mL. A negative result does not preclude SARS-Cov-2 infection and should not be used as the sole basis for treatment or other patient management decisions. A negative result may occur with  improper specimen collection/handling, submission of specimen other than nasopharyngeal swab, presence of viral mutation(s) within the areas targeted by this assay, and inadequate number of viral copies(<138 copies/mL). A negative result must be combined with clinical observations, patient history, and epidemiological information. The expected result is Negative.  Fact Sheet for Patients:  BloggerCourse.com  Fact Sheet for Healthcare Providers:  SeriousBroker.it  This test is no t yet approved or cleared by the [place] FDA and  has been authorized for detection and/or diagnosis of SARS-CoV-2 by FDA under an Emergency Use Authorization (EUA). This EUA will remain  in effect (meaning this test can be used) for the duration of the COVID-19 declaration  under Section 564(b)(1) of the Act, 21 U.S.C.section 360bbb-3(b)(1), unless the authorization is terminated  or revoked sooner.       Influenza A by PCR NEGATIVE NEGATIVE Final   Influenza B by PCR NEGATIVE NEGATIVE Final    Comment: (NOTE) The Xpert Xpress SARS-CoV-2/FLU/RSV plus assay is intended as an aid in the diagnosis of influenza from Nasopharyngeal swab specimens and should not be used as a sole basis for treatment. Nasal washings and aspirates are unacceptable for Xpert Xpress SARS-CoV-2/FLU/RSV testing.  Fact Sheet for Patients: BloggerCourse.com  Fact Sheet for Healthcare Providers: SeriousBroker.it  This test is not yet approved or cleared by the [place] FDA  and has been authorized for detection and/or diagnosis of SARS-CoV-2 by FDA under an Emergency Use Authorization (EUA). This EUA will remain in effect (meaning this test can be used) for the duration of the COVID-19 declaration under Section 564(b)(1) of the Act, 21 U.S.C. section 360bbb-3(b)(1), unless the authorization is terminated or revoked.     Resp Syncytial Virus by PCR NEGATIVE NEGATIVE Final    Comment: (NOTE) Fact Sheet for Patients: BloggerCourse.com  Fact Sheet for Healthcare Providers: SeriousBroker.it  This test is not yet approved or cleared by the [place] FDA and has been authorized for detection and/or diagnosis of SARS-CoV-2 by FDA under an Emergency Use Authorization (EUA). This EUA will remain in effect (meaning this test can be used) for the duration of the COVID-19 declaration under Section 564(b)(1) of the Act, 21 U.S.C. section 360bbb-3(b)(1), unless the authorization is terminated or revoked.  Performed at [facility], [address] [place], [place] [address]          Radiology Studies: DG Cholangiogram Operative Result Date: 10/03/2023 CLINICAL DATA:  Cholelithiasis.  ERCP. EXAM: INTRAOPERATIVE CHOLANGIOGRAM TECHNIQUE: Cholangiographic images from the C-arm fluoroscopic device were submitted for interpretation post-operatively. Please see the procedural report for the amount of contrast and the fluoroscopy time utilized. FLUOROSCOPY: Radiation Exposure Index (as provided by the fluoroscopic device): 35.63 mGy Kerma COMPARISON:  None Available. FINDINGS: Four intraoperative fluoroscopic spot images provided. The total fluoroscopic time is 59 second with cumulative air Karma of 35.63 mGy. Contrast opacifies the biliary tree. IMPRESSION: Intraoperative cholangiogram. Electronically Signed   By: Elgie Collard M.D.   On: 10/03/2023 13:10   DG ERCP Result Date:  10/02/2023 CLINICAL DATA:  Attempted but aborted ERCP.  Biliary stenosis. EXAM: ERCP TECHNIQUE: Multiple spot images obtained with the fluoroscopic device and submitted for interpretation post-procedure. FLUOROSCOPY: Radiation Exposure Index (as provided by the fluoroscopic device): 0.23 mGy Kerma COMPARISON:  MRCP 10/01/2023 FINDINGS: A single saved intra procedural images submitted for review. The image demonstrates the patient's right upper quadrant. No medical devices are visualized. Per notes, the procedure was aborted. IMPRESSION: Aborted ERCP. These images were submitted for radiologic interpretation only. Please see the procedural report for the amount of contrast and the fluoroscopy time utilized. Electronically Signed   By: Malachy Moan M.D.   On: 10/02/2023 09:09        Scheduled Meds:  pantoprazole (PROTONIX) IV  40 mg Intravenous Q12H   Continuous Infusions:  ampicillin-sulbactam (UNASYN) IV 3 g (10/03/23 1208)     LOS: 3 days    Time spent: 35 minutes.     Alba Cory, MD [facility]   If 7PM-7AM, please contact night-coverage [email or web address]  10/03/2023, 1:20 PM

## 2023-10-03 NOTE — Transfer of Care (Signed)
 Immediate Anesthesia Transfer of Care Note  Patient: Andrew Douglas  Procedure(s) Performed: LAPAROSCOPIC CHOLECYSTECTOMY WITH ICG DYE AND  INTRAOPERATIVE CHOLANGIOGRAM (Abdomen)  Patient Location: PACU  Anesthesia Type:General  Level of Consciousness: sedated and drowsy  Airway & Oxygen Therapy: Patient Spontanous Breathing  Post-op Assessment: Report given to RN  Post vital signs: Reviewed and stable  Last Vitals:  Vitals Value Taken Time  BP 138/78 10/03/23 1053  Temp    Pulse 72 10/03/23 1055  Resp 19 10/03/23 1055  SpO2 92 % 10/03/23 1055  Vitals shown include unfiled device data.  Last Pain:  Vitals:   10/03/23 0846  TempSrc:   PainSc: 0-No pain         Complications: No notable events documented.

## 2023-10-03 NOTE — Plan of Care (Signed)
 ?  Problem: Clinical Measurements: ?Goal: Will remain free from infection ?Outcome: Progressing ?  ?

## 2023-10-03 NOTE — Op Note (Signed)
 Laparoscopic Cholecystectomy with IOC and ICG dye procedure Note  Indications: This patient presents with symptomatic gallbladder disease and will undergo laparoscopic cholecystectomy.  He was admitted for abdominal pain and found to have a gallbladder mass and elevated liver function studies.  MRCP showed concerns for possible common duct stone.  ERCP was attempted was aborted due to technical difficulties.  Recommend laparoscopic cholecystectomy with cholangiogram to further evaluate gallbladder disease as well as choledocholithiasis.  Explained that the stone may not be extracted with laparoscopic cholecystectomy but certainly get more information to determine if further ERCP helpful.The procedure has been discussed with the patient. Operative and non operative treatments have been discussed. Risks of surgery include bleeding, infection,  Common bile duct injury,  Injury to the stomach,liver, colon,small intestine, abdominal wall,  Diaphragm,  Major blood vessels,  And the need for an open procedure.  Other risks include worsening of medical problems, death,  DVT and pulmonary embolism, and cardiovascular events.   Medical options have also been discussed. The patient has been informed of long term expectations of surgery and non surgical options,  The patient agrees to proceed.     Pre-operative Diagnosis: Calculus of gallbladder with acute cholecystitis, without mention of obstruction  Post-operative Diagnosis: Same  Surgeon: Dortha Schwalbe MD   Assistants: Or staff  Anesthesia: General endotracheal anesthesia and Local anesthesia 0.25.% bupivacaine  ASA Class: 2  Procedure Details  The patient was seen again in the Holding Room. The risks, benefits, complications, treatment options, and expected outcomes were discussed with the patient. The possibilities of reaction to medication, pulmonary aspiration, perforation of viscus, bleeding, recurrent infection, finding a normal gallbladder, the  need for additional procedures, failure to diagnose a condition, the possible need to convert to an open procedure, and creating a complication requiring transfusion or operation were discussed with the patient. The patient and/or family concurred with the proposed plan, giving informed consent. The site of surgery properly noted/marked. The patient was taken to Operating Room, identified as Andrew Douglas and the procedure verified as Laparoscopic Cholecystectomy with Intraoperative Cholangiograms. A Time Out was held and the above information confirmed.  Prior to the induction of general anesthesia, antibiotic prophylaxis was administered. General endotracheal anesthesia was then administered and tolerated well. After the induction, the abdomen was prepped in the usual sterile fashion. The patient was positioned in the supine position with the left arm comfortably tucked, along with some reverse Trendelenburg.  Local anesthetic agent was injected into the skin near the umbilicus and an incision made. The midline fascia was incised and the Hasson technique was used to introduce a 12 mm port under direct vision. It was secured with a figure of eight Vicryl suture placed in the usual fashion. Pneumoperitoneum was then created with CO2 and tolerated well without any adverse changes in the patient's vital signs. Additional trocars were introduced under direct vision with an 11 mm trocar in the epigastrium and 2 5 mm trocars in the right upper quadrant. All skin incisions were infiltrated with a local anesthetic agent before making the incision and placing the trocars.   The gallbladder was identified, the fundus grasped and retracted cephalad. Adhesions were lysed bluntly and with the electrocautery where indicated, taking care not to injure any adjacent organs or viscus. The infundibulum was grasped and retracted laterally, exposing the peritoneum overlying the triangle of Calot. This was then divided and  exposed in a blunt fashion. The cystic duct was clearly identified and bluntly dissected circumferentially. The junctions  of the gallbladder, cystic duct and common bile duct were clearly identified prior to the division of any linear structure.   Intraoperative fluoroscopic views with ICG dye.  I can see the junction of the cystic duct with the common bile duct.  The common bile duct was dilated.  An incision was made in the cystic duct and the cholangiogram catheter introduced. The catheter was secured using an endoclip.  Cholangiogram performed.  The junction of the tortuous cystic duct and common bile duct was defined.  The common hepatic duct was dilated and the right left hepatic ducts were visualized.  He had a long common bile duct with tapered to the duodenum.  Multiple attempts to pass contrast were unsuccessful.  1 mg of glucagon was administered.  I did note some contrast in the duodenum but there is not good flow out of the ampulla despite multiple attempts to flush the area.  There is significant back pressure as well.  Concern for distal common bile duct stone remains and recommend reconsideration of ERCP if possible.  The catheter was then removed.   The cystic duct was then  ligated with surgical clips  on the patient side and  clipped on the gallbladder side and divided. The cystic artery was identified, dissected free, ligated with clips and divided as well. Posterior cystic artery clipped and divided.  The gallbladder was dissected from the liver bed in retrograde fashion with the electrocautery. The gallbladder was removed and placed into an EKOS sac.. The liver bed was irrigated and inspected. Hemostasis was achieved with the electrocautery. Copious irrigation was utilized and was repeatedly aspirated until clear all particulate matter. Hemostasis was achieved with no signs  Of bleeding or bile leakage.  Gallbladder removed through the umbilical port site.  Pneumoperitoneum was  completely reduced after viewing removal of the trocars under direct vision. The wound was thoroughly irrigated and the fascia was then closed with a figure of eight suture; the skin was then closed with 4-0 Monocryl and a sterile dressing of Dermabond was applied.  Instrument, sponge, and needle counts were correct at closure and at the conclusion of the case.   Findings: Cholecystitis with Cholelithiasis  Estimated Blood Loss: less than 50 mL         Drains: None         Total IV Fluids: Per OR record         Specimens: Gallbladder           Complications: None; patient tolerated the procedure well.         Disposition: PACU - hemodynamically stable.         Condition: stable

## 2023-10-04 ENCOUNTER — Inpatient Hospital Stay (HOSPITAL_COMMUNITY)

## 2023-10-04 ENCOUNTER — Inpatient Hospital Stay (HOSPITAL_COMMUNITY): Admitting: Certified Registered Nurse Anesthetist

## 2023-10-04 ENCOUNTER — Encounter (HOSPITAL_COMMUNITY): Payer: Self-pay | Admitting: Surgery

## 2023-10-04 ENCOUNTER — Encounter (HOSPITAL_COMMUNITY)
Admission: EM | Disposition: A | Discharge status: Home / Self Care | Payer: Self-pay | Source: Home / Self Care | Attending: Internal Medicine

## 2023-10-04 DIAGNOSIS — K831 Obstruction of bile duct: Secondary | ICD-10-CM | POA: Diagnosis not present

## 2023-10-04 HISTORY — PX: SPHINCTEROTOMY: SHX5279

## 2023-10-04 HISTORY — PX: BILIARY DILATION: SHX6850

## 2023-10-04 HISTORY — PX: ERCP: SHX5425

## 2023-10-04 LAB — CBC
HCT: 32.9 % — ABNORMAL LOW (ref 39.0–52.0)
Hemoglobin: 10.2 g/dL — ABNORMAL LOW (ref 13.0–17.0)
MCH: 28.2 pg (ref 26.0–34.0)
MCHC: 31 g/dL (ref 30.0–36.0)
MCV: 90.9 fL (ref 80.0–100.0)
Platelets: 287 10*3/uL (ref 150–400)
RBC: 3.62 MIL/uL — ABNORMAL LOW (ref 4.22–5.81)
RDW: 13.7 % (ref 11.5–15.5)
WBC: 10 10*3/uL (ref 4.0–10.5)
nRBC: 0 % (ref 0.0–0.2)

## 2023-10-04 LAB — COMPREHENSIVE METABOLIC PANEL
ALT: 134 U/L — ABNORMAL HIGH (ref 0–44)
AST: 88 U/L — ABNORMAL HIGH (ref 15–41)
Albumin: 2.3 g/dL — ABNORMAL LOW (ref 3.5–5.0)
Alkaline Phosphatase: 181 U/L — ABNORMAL HIGH (ref 38–126)
Anion gap: 4 — ABNORMAL LOW (ref 5–15)
BUN: 15 mg/dL (ref 8–23)
CO2: 25 mmol/L (ref 22–32)
Calcium: 8 mg/dL — ABNORMAL LOW (ref 8.9–10.3)
Chloride: 110 mmol/L (ref 98–111)
Creatinine, Ser: 0.91 mg/dL (ref 0.61–1.24)
GFR, Estimated: >60 mL/min (ref >60)
Glucose, Bld: 111 mg/dL — ABNORMAL HIGH (ref 70–99)
Potassium: 3.9 mmol/L (ref 3.5–5.1)
Sodium: 139 mmol/L (ref 135–145)
Total Bilirubin: 2 mg/dL — ABNORMAL HIGH (ref 0.0–1.2)
Total Protein: 5.5 g/dL — ABNORMAL LOW (ref 6.5–8.1)

## 2023-10-04 LAB — SURGICAL PATHOLOGY

## 2023-10-04 SURGERY — ERCP, WITH INTERVENTION IF INDICATED
Anesthesia: General

## 2023-10-04 MED ORDER — ONDANSETRON HCL 4 MG/2ML IJ SOLN: 4.0000 mg | Freq: Once | INTRAMUSCULAR | Status: DC | PRN

## 2023-10-04 MED ORDER — PHENYLEPHRINE HCL (PRESSORS) 10 MG/ML IV SOLN
INTRAVENOUS | Status: DC | PRN
  Administered 2023-10-04 (×6): 160 ug via INTRAVENOUS
  Administered 2023-10-04: 80 ug via INTRAVENOUS
  Administered 2023-10-04 (×3): 160 ug via INTRAVENOUS

## 2023-10-04 MED ORDER — PROPOFOL 10 MG/ML IV BOLUS
INTRAVENOUS | Status: AC
  Filled 2023-10-04: qty 20

## 2023-10-04 MED ORDER — DICLOFENAC SUPPOSITORY 100 MG
RECTAL | Status: AC
  Filled 2023-10-04: qty 1

## 2023-10-04 MED ORDER — DICLOFENAC SUPPOSITORY 100 MG
RECTAL | Status: DC | PRN
  Administered 2023-10-04: 100 mg via RECTAL

## 2023-10-04 MED ORDER — EPHEDRINE SULFATE (PRESSORS) 50 MG/ML IJ SOLN
INTRAMUSCULAR | Status: DC | PRN
Start: 2023-10-04 — End: 2023-10-04
  Administered 2023-10-04: 10 mg via INTRAVENOUS
  Administered 2023-10-04: 15 mg via INTRAVENOUS

## 2023-10-04 MED ORDER — GLUCAGON HCL RDNA (DIAGNOSTIC) 1 MG IJ SOLR
INTRAMUSCULAR | Status: DC | PRN
  Administered 2023-10-04 (×2): .5 mg via INTRAVENOUS

## 2023-10-04 MED ORDER — SUGAMMADEX SODIUM 200 MG/2ML IV SOLN
INTRAVENOUS | Status: DC | PRN
  Administered 2023-10-04: 200 mg via INTRAVENOUS

## 2023-10-04 MED ORDER — PROPOFOL 500 MG/50ML IV EMUL
INTRAVENOUS | Status: DC | PRN
  Administered 2023-10-04: 25 ug/kg/min via INTRAVENOUS

## 2023-10-04 MED ORDER — FENTANYL CITRATE (PF) 100 MCG/2ML IJ SOLN
INTRAMUSCULAR | Status: DC | PRN
  Administered 2023-10-04: 25 ug via INTRAVENOUS
  Administered 2023-10-04: 50 ug via INTRAVENOUS
  Administered 2023-10-04: 25 ug via INTRAVENOUS

## 2023-10-04 MED ORDER — SODIUM CHLORIDE 0.9 % IV SOLN: INTRAVENOUS | Status: DC

## 2023-10-04 MED ORDER — POLYETHYLENE GLYCOL 3350 17 G PO PACK: 17.0000 g | PACK | Freq: Every day | ORAL | Status: DC

## 2023-10-04 MED ORDER — GLUCAGON HCL RDNA (DIAGNOSTIC) 1 MG IJ SOLR
INTRAMUSCULAR | Status: AC
Start: 2023-10-04 — End: ?
  Filled 2023-10-04: qty 2

## 2023-10-04 MED ORDER — ROCURONIUM BROMIDE 100 MG/10ML IV SOLN
INTRAVENOUS | Status: DC | PRN
  Administered 2023-10-04: 50 mg via INTRAVENOUS
  Administered 2023-10-04: 10 mg via INTRAVENOUS

## 2023-10-04 MED ORDER — SODIUM CHLORIDE 0.9 % IV SOLN
INTRAVENOUS | Status: DC | PRN
  Administered 2023-10-04: 20 mL

## 2023-10-04 MED ORDER — LACTATED RINGERS IV SOLN: INTRAVENOUS | Status: DC

## 2023-10-04 MED ORDER — FENTANYL CITRATE (PF) 100 MCG/2ML IJ SOLN
INTRAMUSCULAR | Status: AC
  Filled 2023-10-04: qty 2

## 2023-10-04 MED ORDER — ONDANSETRON HCL 4 MG/2ML IJ SOLN
INTRAMUSCULAR | Status: DC | PRN
  Administered 2023-10-04: 4 mg via INTRAVENOUS

## 2023-10-04 NOTE — Plan of Care (Signed)
  Problem: Activity: Goal: Risk for activity intolerance will decrease Outcome: Progressing   Problem: Nutrition: Goal: Adequate nutrition will be maintained Outcome: Progressing   Problem: Coping: Goal: Level of anxiety will decrease Outcome: Progressing   Problem: Elimination: Goal: Will not experience complications related to bowel motility Outcome: Completed/Met   Problem: Elimination: Goal: Will not experience complications related to urinary retention Outcome: Completed/Met

## 2023-10-04 NOTE — Transfer of Care (Signed)
 Immediate Anesthesia Transfer of Care Note  Patient: Andrew Douglas  Procedure(s) Performed: ERCP, WITH INTERVENTION IF INDICATED SPHINCTEROTOMY, BILIARY DILATION, STRICTURE, BILE DUCT  Patient Location: PACU  Anesthesia Type:General  Level of Consciousness: awake, alert , and oriented  Airway & Oxygen Therapy: Patient Spontanous Breathing and Patient connected to face mask oxygen  Post-op Assessment: Report given to RN and Post -op Vital signs reviewed and stable  Post vital signs: Reviewed and stable  Last Vitals:  Vitals Value Taken Time  BP 119/77 10/04/23 1412  Temp    Pulse 77 10/04/23 1418  Resp 14 10/04/23 1418  SpO2 96 % 10/04/23 1418  Vitals shown include unfiled device data.  Last Pain:  Vitals:   10/04/23 1412  TempSrc:   PainSc: 0-No pain      Patients Stated Pain Goal: 0 (10/04/23 0745)  Complications: No notable events documented.

## 2023-10-04 NOTE — Anesthesia Postprocedure Evaluation (Signed)
 Anesthesia Post Note  Patient: Navarre Uram  Procedure(s) Performed: ERCP, WITH INTERVENTION IF INDICATED SPHINCTEROTOMY, BILIARY DILATION, STRICTURE, BILE DUCT     Patient location during evaluation: PACU Anesthesia Type: General Level of consciousness: awake and alert Pain management: pain level controlled Vital Signs Assessment: post-procedure vital signs reviewed and stable Respiratory status: spontaneous breathing, nonlabored ventilation, respiratory function stable and patient connected to nasal cannula oxygen Cardiovascular status: blood pressure returned to baseline and stable Postop Assessment: no apparent nausea or vomiting Anesthetic complications: no  No notable events documented.  Last Vitals:  Vitals:   10/04/23 1420 10/04/23 1430  BP: 135/79 139/83  Pulse: 76 75  Resp: 16 15  Temp:    SpO2: 94% 93%    Last Pain:  Vitals:   10/04/23 1430  TempSrc:   PainSc: 0-No pain                 Beryle Lathe

## 2023-10-04 NOTE — Anesthesia Postprocedure Evaluation (Signed)
 Anesthesia Post Note  Patient: Andrew Douglas  Procedure(s) Performed: ERCP ABORTED CASE EGD (ESOPHAGOGASTRODUODENOSCOPY)     Patient location during evaluation: PACU Anesthesia Type: General Level of consciousness: awake and alert Pain management: pain level controlled Vital Signs Assessment: post-procedure vital signs reviewed and stable Respiratory status: spontaneous breathing, nonlabored ventilation, respiratory function stable and patient connected to nasal cannula oxygen Cardiovascular status: blood pressure returned to baseline and stable Postop Assessment: no apparent nausea or vomiting Anesthetic complications: no   No notable events documented.  Last Vitals:  Vitals:   10/04/23 0523 10/04/23 1154  BP: 106/71 (!) 168/84  Pulse: 62 64  Resp: 17 15  Temp: 36.7 C (!) 36.2 C  SpO2: 96% 94%    Last Pain:  Vitals:   10/04/23 1154  TempSrc: Temporal  PainSc: 0-No pain                 Noelia Lenart S

## 2023-10-04 NOTE — Progress Notes (Signed)
 Andrew Douglas 12:04 PM  Subjective: Patient doing fine from his surgery in his hospital computer chart reviewed and his case discussed with my partner Dr. Bosie Clos and we rediscussed the procedure  Objective: Vital signs stable afebrile no acute distress exam please see preassessment evaluation MRCP and IntraOp cholangiogram reviewed liver tests downtrending CBC stable  Assessment: Concerns over CBD stone versus distal stricture  Plan: Okay to proceed with ERCP with anesthesia assistance  [facility]  office [phone] After 5PM or if no answer call [phone]

## 2023-10-04 NOTE — Progress Notes (Signed)
 PROGRESS NOTE    Andrew Douglas  ZOX:[id number] DOB: [birth year] DOA: 09/30/2023 PCP: Elias Else, MD   Brief Narrative: 78 year old with past medical history significant for hypertension, treated hepatitis C presents with nausea vomiting found to have biliary obstruction.  Presented with transaminases: Alkaline phosphatase 292, AST 228, ALT 281. CT abdomen Pelvis: Intra and extrahepatic biliary duct dilatation appears new since previous MRI from 2019. Underwent cholecystectomy 3/24. IOC inconclusive for biliary obstruction. Plan to attempt ERCP again 3/25.   Assessment & Plan:   Principal Problem:   Biliary obstruction  1-Biliary obstruction, Acute Cholecystitis, Choledocholithiasis.   Transaminases, Hyperbilirubinemia -Patient presented with nausea vomiting for 6 days prior to admission. -CT abdomen and pelvis show intra and extrahepatic biliary duct dilation new since previous MRI from 2019. Common bile duct tapers relatively abruptly in the head of the pancreas just proximal to the ampulla without obstructing mass lesion or stone evident by CT imaging. -GI consulted recommended MRCP.  -MRCP:  Substantial intra and extrahepatic biliary ductal dilatation, common bile duct measuring up to 1.1 cm in caliber. The duct tapers smoothly to the ampulla; there appears to be layering sludge or gravel at the ampulla without discrete calculi. No obstructing mass or other obstructing lesion identified. Mild, diffuse gallbladder wall thickening, concerning for cholecystitis. Enhancing exophytic mural nodule of the anterior medial aspect of the gallbladder wall measuring 1.5 x 0.9 cm, suspicious for gallbladder neoplasm, possibly reflecting very prominent focal adenomyomatosis. -Underwent ERCP 3/23 was aborted, unable to enter distal body of stomach, found to have mucosal laceration stomach and esophagus.  -Surgery consulted due to concern for Cholecystis by  MRCP and gallbladder nodule.  -Continue  with  IV Unasyn.  -Underwent Cholecystectomy 3/24. IOC suggesting distal CBD. GI planning ERCP today.  LFT trending down.  Resume prior diet per GI post ERCP  History of hepatitis C: Treated Hepatitis panel, hepatitis C antibody reactive  Hypokalemia:  -Replaced   Hypertension: -Continue to hold losartan and hydrochlorothiazide, systolic blood pressure soft on admission.  If BP increase could resume meds.        Estimated body mass index is 25.77 kg/m as calculated from the following:   Height as of this encounter: 6\' 1"  (1.854 m).   Weight as of this encounter: 88.6 kg.   DVT prophylaxis: SCD Code Status: Full code Family Communication: Wife   at bedside 3/25 Disposition Plan:  Status is: Inpatient Remains inpatient appropriate because: management of transaminases    Consultants:  GI Surgery   Procedures:  MRCP  Antimicrobials:    Subjective: He is sitting in bed, no new complaints. He was able to tolerates supper yesterday, no worsening pain r nausea.  Awaiting to have ERCP Objective: Vitals:   10/03/23 1748 10/03/23 2122 10/04/23 0523 10/04/23 1154  BP: 114/75 118/78 106/71 (!) 168/84  Pulse: 70 60 62 64  Resp:  18 17 15   Temp: 97.9 F (36.6 C) 97.9 F (36.6 C) 98.1 F (36.7 C) (!) 97.2 F (36.2 C)  TempSrc: Oral Oral Oral Temporal  SpO2: 97% 96% 96% 94%  Weight:      Height:        Intake/Output Summary (Last 24 hours) at 10/04/2023 1225 Last data filed at 10/04/2023 0943 Gross per 24 hour  Intake 2360.64 ml  Output 500 ml  Net 1860.64 ml   Filed Weights   09/30/23 1333 10/03/23 0846  Weight: 88.6 kg 88.6 kg    Examination:  General exam: NAD Respiratory system:  CTA Cardiovascular  system: S 1, S 2  RRR Gastrointestinal system:BS present, soft , nt Extremities: No edema  Data Reviewed: I have personally reviewed following labs and imaging studies  CBC: Recent Labs  Lab 09/30/23 0617 10/01/23 0521 10/02/23 0504  10/03/23 0437 10/04/23 0506  WBC 11.7* 9.4 7.4 8.8 10.0  HGB 12.6* 10.9* 10.7* 10.8* 10.2*  HCT 40.0 33.6* 33.5* 34.1* 32.9*  MCV 88.9 87.7 89.3 90.0 90.9  PLT 261 240 243 273 287   Basic Metabolic Panel: Recent Labs  Lab 09/30/23 0617 09/30/23 1354 10/01/23 0521 10/02/23 0504 10/03/23 0437 10/04/23 0506  NA 138  --  138 139 139 139  K 3.1*  --  3.2* 4.2 4.1 3.9  CL 102  --  106 110 110 110  CO2 25  --  24 23 23 25   GLUCOSE 137*  --  101* 120* 131* 111*  BUN 19  --  15 14 10 15   CREATININE 1.12  --  0.82 0.90 0.64 0.91  CALCIUM 8.9  --  8.1* 8.0* 8.3* 8.0*  MG  --  2.0  --   --   --   --    GFR: Estimated Creatinine Clearance: 75.6 mL/min (by C-G formula based on SCr of 0.91 mg/dL). Liver Function Tests: Recent Labs  Lab 09/30/23 0617 10/01/23 0521 10/02/23 0504 10/03/23 0437 10/04/23 0506  AST 228* 123* 110* 67* 88*  ALT 281* 195* 171* 142* 134*  ALKPHOS 292* 246* 223* 210* 181*  BILITOT 6.6* 7.0* 5.1* 2.5* 2.0*  PROT 7.1 6.3* 5.8* 6.0* 5.5*  ALBUMIN 3.3* 2.7* 2.4* 2.6* 2.3*   Recent Labs  Lab 09/30/23 0617  LIPASE 20   No results for input(s): "AMMONIA" in the last 168 hours. Coagulation Profile: Recent Labs  Lab 10/01/23 1329  INR 1.2   Cardiac Enzymes: No results for input(s): "CKTOTAL", "CKMB", "CKMBINDEX", "TROPONINI" in the last 168 hours. BNP (last 3 results) No results for input(s): "PROBNP" in the last 8760 hours. HbA1C: No results for input(s): "HGBA1C" in the last 72 hours. CBG: No results for input(s): "GLUCAP" in the last 168 hours. Lipid Profile: No results for input(s): "CHOL", "HDL", "LDLCALC", "TRIG", "CHOLHDL", "LDLDIRECT" in the last 72 hours. Thyroid Function Tests: No results for input(s): "TSH", "T4TOTAL", "FREET4", "T3FREE", "THYROIDAB" in the last 72 hours. Anemia Panel: No results for input(s): "VITAMINB12", "FOLATE", "FERRITIN", "TIBC", "IRON", "RETICCTPCT" in the last 72 hours. Sepsis Labs: Recent Labs  Lab  09/30/23 0102  LATICACIDVEN 1.4    Recent Results (from the past 240 hours)  Resp panel by RT-PCR (RSV, Flu A&B, Covid) Anterior Nasal Swab     Status: None   Collection Time: 09/30/23  6:13 AM   Specimen: Anterior Nasal Swab  Result Value Ref Range Status   SARS Coronavirus 2 by RT PCR NEGATIVE NEGATIVE Final    Comment: (NOTE) SARS-CoV-2 target nucleic acids are NOT DETECTED.  The SARS-CoV-2 RNA is generally detectable in upper respiratory specimens during the acute phase of infection. The lowest concentration of SARS-CoV-2 viral copies this assay can detect is 138 copies/mL. A negative result does not preclude SARS-Cov-2 infection and should not be used as the sole basis for treatment or other patient management decisions. A negative result may occur with  improper specimen collection/handling, submission of specimen other than nasopharyngeal swab, presence of viral mutation(s) within the areas targeted by this assay, and inadequate number of viral copies(<138 copies/mL). A negative result must be combined with clinical observations, patient history, and epidemiological information.  The expected result is Negative.  Fact Sheet for Patients:  BloggerCourse.com  Fact Sheet for Healthcare Providers:  SeriousBroker.it  This test is no t yet approved or cleared by the [place] FDA and  has been authorized for detection and/or diagnosis of SARS-CoV-2 by FDA under an Emergency Use Authorization (EUA). This EUA will remain  in effect (meaning this test can be used) for the duration of the COVID-19 declaration under Section 564(b)(1) of the Act, 21 U.S.C.section 360bbb-3(b)(1), unless the authorization is terminated  or revoked sooner.       Influenza A by PCR NEGATIVE NEGATIVE Final   Influenza B by PCR NEGATIVE NEGATIVE Final    Comment: (NOTE) The Xpert Xpress SARS-CoV-2/FLU/RSV plus assay is intended as an aid in the  diagnosis of influenza from Nasopharyngeal swab specimens and should not be used as a sole basis for treatment. Nasal washings and aspirates are unacceptable for Xpert Xpress SARS-CoV-2/FLU/RSV testing.  Fact Sheet for Patients: BloggerCourse.com  Fact Sheet for Healthcare Providers: SeriousBroker.it  This test is not yet approved or cleared by the [place] FDA and has been authorized for detection and/or diagnosis of SARS-CoV-2 by FDA under an Emergency Use Authorization (EUA). This EUA will remain in effect (meaning this test can be used) for the duration of the COVID-19 declaration under Section 564(b)(1) of the Act, 21 U.S.C. section 360bbb-3(b)(1), unless the authorization is terminated or revoked.     Resp Syncytial Virus by PCR NEGATIVE NEGATIVE Final    Comment: (NOTE) Fact Sheet for Patients: BloggerCourse.com  Fact Sheet for Healthcare Providers: SeriousBroker.it  This test is not yet approved or cleared by the [place] FDA and has been authorized for detection and/or diagnosis of SARS-CoV-2 by FDA under an Emergency Use Authorization (EUA). This EUA will remain in effect (meaning this test can be used) for the duration of the COVID-19 declaration under Section 564(b)(1) of the Act, 21 U.S.C. section 360bbb-3(b)(1), unless the authorization is terminated or revoked.  Performed at [facility], [address] [place], [place] [address]          Radiology Studies: DG Cholangiogram Operative Result Date: 10/03/2023 CLINICAL DATA:  Cholelithiasis.  ERCP. EXAM: INTRAOPERATIVE CHOLANGIOGRAM TECHNIQUE: Cholangiographic images from the C-arm fluoroscopic device were submitted for interpretation post-operatively. Please see the procedural report for the amount of contrast and the fluoroscopy time utilized. FLUOROSCOPY: Radiation Exposure Index  (as provided by the fluoroscopic device): 35.63 mGy Kerma COMPARISON:  None Available. FINDINGS: Four intraoperative fluoroscopic spot images provided. The total fluoroscopic time is 59 second with cumulative air Karma of 35.63 mGy. Contrast opacifies the biliary tree. IMPRESSION: Intraoperative cholangiogram. Electronically Signed   By: Elgie Collard M.D.   On: 10/03/2023 13:10        Scheduled Meds:  [MAR Hold] pantoprazole (PROTONIX) IV  40 mg Intravenous Q12H   Continuous Infusions:  [MAR Hold] ampicillin-sulbactam (UNASYN) IV 3 g (10/04/23 1124)   lactated ringers 75 mL/hr at 10/04/23 0404     LOS: 4 days    Time spent: 35 minutes.     Alba Cory, MD [facility]   If 7PM-7AM, please contact night-coverage [email or web address]  10/04/2023, 12:25 PM

## 2023-10-04 NOTE — Anesthesia Preprocedure Evaluation (Addendum)
 Anesthesia Evaluation  Patient identified by MRN, date of birth, ID band Patient awake    Reviewed: Allergy & Precautions, NPO status , Patient's Chart, lab work & pertinent test results  History of Anesthesia Complications Negative for: history of anesthetic complications  Airway Mallampati: II  TM Distance: >3 FB Neck ROM: Full    Dental  (+) Dental Advisory Given, Partial Upper   Pulmonary former smoker   Pulmonary exam normal        Cardiovascular hypertension, Pt. on medications Normal cardiovascular exam     Neuro/Psych negative neurological ROS  negative psych ROS   GI/Hepatic negative GI ROS,,,(+) Hepatitis -, C  Endo/Other  negative endocrine ROS    Renal/GU negative Renal ROS     Musculoskeletal negative musculoskeletal ROS (+)    Abdominal   Peds  Hematology  (+) Blood dyscrasia, anemia   Anesthesia Other Findings   Reproductive/Obstetrics                              Anesthesia Physical Anesthesia Plan  ASA: 2  Anesthesia Plan: General   Post-op Pain Management: Minimal or no pain anticipated   Induction: Intravenous  PONV Risk Score and Plan: 2 and Treatment may vary due to age or medical condition, Ondansetron and Propofol infusion  Airway Management Planned: Oral ETT  Additional Equipment: None  Intra-op Plan:   Post-operative Plan: Extubation in OR  Informed Consent: I have reviewed the patients History and Physical, chart, labs and discussed the procedure including the risks, benefits and alternatives for the proposed anesthesia with the patient or authorized representative who has indicated his/her understanding and acceptance.     Dental advisory given  Plan Discussed with: CRNA and Anesthesiologist  Anesthesia Plan Comments:          Anesthesia Quick Evaluation

## 2023-10-04 NOTE — Progress Notes (Signed)
 Progress Note  * Day of Surgery *  Subjective: Pt reports mild abdominal soreness. Denies nausea or vomiting.   Objective: Vital signs in last 24 hours: Temp:  [97.2 F (36.2 C)-98.3 F (36.8 C)] 97.2 F (36.2 C) (03/25 1154) Pulse Rate:  [60-76] 64 (03/25 1154) Resp:  [15-18] 15 (03/25 1154) BP: (106-168)/(71-88) 168/84 (03/25 1154) SpO2:  [94 %-97 %] 94 % (03/25 1154) Last BM Date : 10/04/23  Intake/Output from previous day: 03/24 0701 - 03/25 0700 In: 3273.6 [P.O.:660; I.V.:2066.5; IV Piggyback:547.1] Out: 520 [Urine:500; Blood:20] Intake/Output this shift: No intake/output data recorded.  PE: General: pleasant, WD, WN male who is laying in bed in NAD HEENT: sclera anicteric  Heart: regular, rate, and rhythm.   Lungs: Respiratory effort nonlabored Abd: soft, appropriately TTP, mild distention, incisions C/D/I Psych: A&Ox3 with an appropriate affect.    Lab Results:  Recent Labs    10/03/23 0437 10/04/23 0506  WBC 8.8 10.0  HGB 10.8* 10.2*  HCT 34.1* 32.9*  PLT 273 287   BMET Recent Labs    10/03/23 0437 10/04/23 0506  NA 139 139  K 4.1 3.9  CL 110 110  CO2 23 25  GLUCOSE 131* 111*  BUN 10 15  CREATININE 0.64 0.91  CALCIUM 8.3* 8.0*   PT/INR Recent Labs    10/01/23 1329  LABPROT 15.3*  INR 1.2   CMP     Component Value Date/Time   NA 139 10/04/2023 0506   K 3.9 10/04/2023 0506   CL 110 10/04/2023 0506   CO2 25 10/04/2023 0506   GLUCOSE 111 (H) 10/04/2023 0506   BUN 15 10/04/2023 0506   CREATININE 0.91 10/04/2023 0506   CALCIUM 8.0 (L) 10/04/2023 0506   PROT 5.5 (L) 10/04/2023 0506   ALBUMIN 2.3 (L) 10/04/2023 0506   AST 88 (H) 10/04/2023 0506   ALT 134 (H) 10/04/2023 0506   ALKPHOS 181 (H) 10/04/2023 0506   BILITOT 2.0 (H) 10/04/2023 0506   GFRNONAA >60 10/04/2023 0506   Lipase     Component Value Date/Time   LIPASE 20 09/30/2023 0617       Studies/Results: DG Cholangiogram Operative Result Date: 10/03/2023 CLINICAL  DATA:  Cholelithiasis.  ERCP. EXAM: INTRAOPERATIVE CHOLANGIOGRAM TECHNIQUE: Cholangiographic images from the C-arm fluoroscopic device were submitted for interpretation post-operatively. Please see the procedural report for the amount of contrast and the fluoroscopy time utilized. FLUOROSCOPY: Radiation Exposure Index (as provided by the fluoroscopic device): 35.63 mGy Kerma COMPARISON:  None Available. FINDINGS: Four intraoperative fluoroscopic spot images provided. The total fluoroscopic time is 59 second with cumulative air Karma of 35.63 mGy. Contrast opacifies the biliary tree. IMPRESSION: Intraoperative cholangiogram. Electronically Signed   By: Elgie Collard M.D.   On: 10/03/2023 13:10    Anti-infectives: Anti-infectives (From admission, onward)    Start     Dose/Rate Route Frequency Ordered Stop   10/03/23 0845  ceFAZolin (ANCEF) IVPB 2g/100 mL premix        2 g 200 mL/hr over 30 Minutes Intravenous On call to O.R. 10/03/23 4259 10/03/23 0931   10/01/23 1200  [MAR Hold]  Ampicillin-Sulbactam (UNASYN) 3 g in sodium chloride 0.9 % 100 mL IVPB        (MAR Hold since Tue 10/04/2023 at 1148.Hold Reason: Transfer to a Procedural area)   3 g 200 mL/hr over 30 Minutes Intravenous Every 6 hours 10/01/23 1028          Assessment/Plan  Acute cholecystitis  Elevated LFTs with dilated  CBD POD1 s/p laparoscopic cholecystectomy with IOC - IOC inconclusive with concern for distal CBD stone - GI planning repeat ERCP today  - LFTs downtrending  - npo for procedure, fine for reg diet as tolerated after procedure from surgical standpoint - will arrange post-op follow up and place instructions in AVS  FEN: NPO, IVF per TRH  VTE: SCDs ID: ancef pre-op   - per TRH -  HTN Hx of Hep C s/p tx  LOS: 4 days     Juliet Rude, [facility] Surgery 10/04/2023, 12:00 PM Please see Amion for pager number during day hours 7:00am-4:30pm

## 2023-10-04 NOTE — Op Note (Signed)
 [facility] Patient Name: Andrew Douglas Procedure Date: 10/04/2023 MRN: [id number] Attending MD: Vida Rigger , MD, [id number] Date of Birth: [birth year] CSN: [id number] Age: 78 Admit Type: Inpatient Procedure:                ERCP Indications:              Abnormal intraoperative cholangiogram, Abnormal                            MRCP, Suspected bile duct stone(s), Elevated liver                            enzymes Providers:                Vida Rigger, MD, Fransisca Connors, Kandice Robinsons,                            Technician Referring MD:              Medicines:                General Anesthesia Complications:            No immediate complications. Estimated Blood Loss:     Estimated blood loss was minimal. Procedure:                Pre-Anesthesia Assessment:                           - Prior to the procedure, a History and Physical                            was performed, and patient medications and                            allergies were reviewed. The patient's tolerance of                            previous anesthesia was also reviewed. The risks                            and benefits of the procedure and the sedation                            options and risks were discussed with the patient.                            All questions were answered, and informed consent                            was obtained. Prior Anticoagulants: The patient has                            taken no anticoagulant or antiplatelet agents                            except for aspirin. ASA Grade Assessment: II - A  patient with mild systemic disease. After reviewing                            the risks and benefits, the patient was deemed in                            satisfactory condition to undergo the procedure.                           After obtaining informed consent, the scope was                            passed under direct vision. Throughout  the                            procedure, the patient's blood pressure, pulse, and                            oxygen saturations were monitored continuously. The                            [id number]-[id number] ([id number]) Olympus duodenoscope was                            introduced through the mouth, and used to inject                            contrast into and used to cannulate the bile duct.                            The ERCP was somewhat difficult due to challenging                            cannulation because of abnormal anatomy. Successful                            completion of the procedure was aided by changing                            the patient to a prone position. We had initially                            started with him on his side which made passing the                            scope much easier than on the first attempt this                            weekend and we also used the regular Olympus ERCP                            scope and not the exalt disposable 1 and the  patient tolerated the procedure well. No blood was                            seen in the stomach on either insertion or                            withdrawal Scope In: Scope Out: Findings:      The major papilla was some distance away from a diverticulum. We       initially tried to cannulate with him on his side but despite using both       the regular sphincterotome and the smaller 1 we were unsuccessful in       cannulating either duct and not mentioned above the major papilla was       normal. We then rolled him into the customary prone ERCP position and       despite trying the smaller sphincterotome again we were unsuccessful so       we proceeded with A biliary pre-cut sphincterotomy was made with a       needle knife using a freehand technique using ERBE electrocautery. There       was self limited oozing from the sphincterotomy which did not require       treatment.  Deep selective cannulation was obtained and dye was injected       into the CBD and a little into the intrahepatics and although no filling       defect was seen there was no biliary drainage so the biliary       sphincterotomy was extended with the smaller Hydratome sphincterotome       using ERBE electrocautery. There was no post-sphincterotomy bleeding.       There was still not much drainage so we proceeded with dilation of the       common bile duct with a 4 cm by 6 mm balloon dilator which was       successful and there was much improved biliary drainage at that point       and To discover objects, the biliary tree was swept with an adjustable       9- 12 mm balloon starting at the bifurcation. We did have to lower the       12 mm balloon to 9 mm which passed readily through the patent       sphincterotomy site and on a few balloon pull-through's nothing was       found and there was no residual filling defect on occlusion       cholangiogram and the balloon was withdrawn through the patent       sphincterotomy site and there was sluggish but probably adequate biliary       drainage at that point and we elected to stop the procedure at this       point and the wire and balloon and scope were all removed and the       patient tolerated the procedure well and there was no pancreatic duct       injection or wire advancement throughout the procedure Impression:               - The major papilla was some distance away from a  diverticulum.                           - The major papilla appeared normal.                           - A precut needle-knife biliary sphincterotomy was                            performed.                           - A conventional extension of the biliary                            sphincterotomy was performed.                           - Common bile duct was successfully dilated.                           - The biliary tree was swept  and nothing was found. Moderate Sedation:      Not Applicable - Patient had care per Anesthesia. Recommendation:           - Clear liquid diet today. If doing well tomorrow                            may slowly advance his diet and hopefully he will                            be able to go home soon                           - Check liver enzymes (AST, ALT, alkaline                            phosphatase, bilirubin) and hemogram with white                            blood cell count and platelets in the morning. And                            follow back to normal as an outpatient and will                            arrange for repeat liver tests if he is doing well                            on surgical follow-up in our office                           - Continue present medications.                           - Return to GI clinic  PRN.                           - Telephone GI clinic if symptomatic PRN. Procedure Code(s):        --- Professional ---                           [phone], Endoscopic retrograde                            cholangiopancreatography (ERCP); with                            trans-endoscopic balloon dilation of                            biliary/pancreatic duct(s) or of ampulla                            (sphincteroplasty), including sphincterotomy, when                            performed, each duct Diagnosis Code(s):        --- Professional ---                           R93.2, Abnormal findings on diagnostic imaging of                            liver and biliary tract                           R74.8, Abnormal levels of other serum enzymes CPT copyright 2022 [employer]. All rights reserved. The codes documented in this report are preliminary and upon coder review may  be revised to meet current compliance requirements. Vida Rigger, MD 10/04/2023 2:17:58 PM This report has been signed electronically. Number of Addenda: 0

## 2023-10-04 NOTE — Anesthesia Procedure Notes (Signed)
 Procedure Name: Intubation Date/Time: 10/04/2023 12:17 PM  Performed by: Cleda Clarks, CRNAPre-anesthesia Checklist: Patient identified, Emergency Drugs available, Suction available and Patient being monitored Patient Re-evaluated:Patient Re-evaluated prior to induction Oxygen Delivery Method: Circle system utilized Preoxygenation: Pre-oxygenation with 100% oxygen Induction Type: IV induction Ventilation: Mask ventilation without difficulty Laryngoscope Size: Miller and 2 Grade View: Grade II Tube type: Oral Tube size: 7.0 mm Number of attempts: 1 Airway Equipment and Method: Stylet and Oral airway Placement Confirmation: ETT inserted through vocal cords under direct vision, positive ETCO2 and breath sounds checked- equal and bilateral Secured at: 23 cm Tube secured with: Tape Dental Injury: Teeth and Oropharynx as per pre-operative assessment

## 2023-10-05 DIAGNOSIS — K831 Obstruction of bile duct: Secondary | ICD-10-CM | POA: Diagnosis not present

## 2023-10-05 LAB — COMPREHENSIVE METABOLIC PANEL
ALT: 116 U/L — ABNORMAL HIGH (ref 0–44)
AST: 62 U/L — ABNORMAL HIGH (ref 15–41)
Albumin: 2.6 g/dL — ABNORMAL LOW (ref 3.5–5.0)
Alkaline Phosphatase: 199 U/L — ABNORMAL HIGH (ref 38–126)
Anion gap: 9 (ref 5–15)
BUN: 12 mg/dL (ref 8–23)
CO2: 25 mmol/L (ref 22–32)
Calcium: 8.2 mg/dL — ABNORMAL LOW (ref 8.9–10.3)
Chloride: 106 mmol/L (ref 98–111)
Creatinine, Ser: 0.82 mg/dL (ref 0.61–1.24)
GFR, Estimated: >60 mL/min (ref >60)
Glucose, Bld: 80 mg/dL (ref 70–99)
Potassium: 3.4 mmol/L — ABNORMAL LOW (ref 3.5–5.1)
Sodium: 140 mmol/L (ref 135–145)
Total Bilirubin: 2.2 mg/dL — ABNORMAL HIGH (ref 0.0–1.2)
Total Protein: 6.2 g/dL — ABNORMAL LOW (ref 6.5–8.1)

## 2023-10-05 LAB — CBC
HCT: 35.2 % — ABNORMAL LOW (ref 39.0–52.0)
Hemoglobin: 11.1 g/dL — ABNORMAL LOW (ref 13.0–17.0)
MCH: 28.6 pg (ref 26.0–34.0)
MCHC: 31.5 g/dL (ref 30.0–36.0)
MCV: 90.7 fL (ref 80.0–100.0)
Platelets: 320 10*3/uL (ref 150–400)
RBC: 3.88 MIL/uL — ABNORMAL LOW (ref 4.22–5.81)
RDW: 13.9 % (ref 11.5–15.5)
WBC: 8.3 10*3/uL (ref 4.0–10.5)
nRBC: 0 % (ref 0.0–0.2)

## 2023-10-05 MED ORDER — POTASSIUM CHLORIDE CRYS ER 20 MEQ PO TBCR
40.0000 meq | EXTENDED_RELEASE_TABLET | Freq: Once | ORAL | Status: AC
  Administered 2023-10-05: 40 meq via ORAL
  Filled 2023-10-05: qty 2

## 2023-10-05 MED ORDER — PANTOPRAZOLE SODIUM 40 MG PO TBEC: 40.0000 mg | DELAYED_RELEASE_TABLET | Freq: Two times a day (BID) | ORAL | Status: DC

## 2023-10-05 MED ORDER — OXYCODONE HCL 5 MG PO TABS: 5.0000 mg | ORAL_TABLET | Freq: Four times a day (QID) | ORAL | 0 refills | Status: DC | PRN

## 2023-10-05 NOTE — Discharge Summary (Signed)
 Physician Discharge Summary   Patient: Andrew Douglas MRN: [id number] DOB: 20-Oct-1945  Admit date:     09/30/2023  Discharge date: 10/05/23  Discharge Physician: Tobey Grim   PCP: Elias Else, MD   Recommendations at discharge:   Ensure patient has follow-up with surgery in the next week or so after discharge.  Patient recommended to follow-up with PCP for lab work this week to ensure resolution of elevated alk phos and transaminitis, all of which were downtrending at time of discharge.  Discharge Diagnoses: Principal Problem:   Biliary obstruction  Resolved Problems:   * No resolved hospital problems. *  Hospital Course: 78 year old with past medical history significant for hypertension, treated hepatitis C presents with nausea vomiting found to have biliary obstruction.  Presented with transaminases: Alkaline phosphatase 292, AST 228, ALT 281. CT abdomen Pelvis: Intra and extrahepatic biliary duct dilatation appears new since previous MRI from 2019. Underwent cholecystectomy 3/24. IOC inconclusive for biliary obstruction.  ERCP repeated on 3/25 with sphincterotomy without definitive stone being seen.  AST/ALT were downtrending on time of discharge.  Alk phos and bilirubin essentially the same as today before with minimal elevation but both greatly decreased from time of admission.  Patient was able to tolerate regular diet for 24 hours prior to discharge and therefore he was discharged home on 3/26 due to continued improvement.  Assessment and Plan: 1-Biliary obstruction, Acute Cholecystitis, Choledocholithiasis.   Transaminases, Hyperbilirubinemia -Patient presented with nausea vomiting for 6 days prior to admission. -CT abdomen and pelvis show intra and extrahepatic biliary duct dilation new since previous MRI from 2019. Common bile duct tapers relatively abruptly in the head of the pancreas just proximal to the ampulla without obstructing mass lesion or stone evident by CT  imaging. As above ERCP with no definitive stone, occurred day after cholecystectomy.  -MRCP:  Substantial intra and extrahepatic biliary ductal dilatation, common bile duct measuring up to 1.1 cm in caliber. The duct tapers smoothly to the ampulla; there appears to be layering sludge or gravel at the ampulla without discrete calculi. No obstructing mass or other obstructing lesion identified. Mild, diffuse gallbladder wall thickening, concerning for cholecystitis. Enhancing exophytic mural nodule of the anterior medial aspect of the gallbladder wall measuring 1.5 x 0.9 cm, suspicious for gallbladder neoplasm, possibly reflecting very prominent focal adenomyomatosis. -Underwent ERCP 3/23 was aborted, unable to enter distal body of stomach, found to have mucosal laceration stomach and esophagus.  -Surgery consulted due to concern for Cholecystis by  MRCP and gallbladder nodule.  -Continue with  IV Unasyn.  -Underwent Cholecystectomy 3/24. IOC suggesting distal CBD.   History of hepatitis C: Treated Hepatitis panel, hepatitis C antibody reactive   Hypokalemia:  -Replaced       Consultants: General Surgery, gastroenterology Procedures performed: Cholecystectomy, ERCP. Disposition: Home Diet recommendation:  Discharge Diet Orders (From admission, onward)     Start     Ordered   10/05/23 0000  Diet - low sodium heart healthy        10/05/23 1233            DISCHARGE MEDICATION: Allergies as of 10/05/2023   No Known Allergies      Medication List     TAKE these medications    ascorbic acid 500 MG tablet Commonly known as: VITAMIN C Take 500 mg by mouth daily.   aspirin EC 81 MG tablet Take 81 mg by mouth daily. Swallow whole.   benzonatate 200 MG capsule Commonly known as: TESSALON Take  200 mg by mouth 3 (three) times daily as needed for cough.   dorzolamide-timolol 2-0.5 % ophthalmic solution Commonly known as: COSOPT Place 1 drop into both eyes 2 (two) times  daily.   losartan-hydrochlorothiazide 100-12.5 MG tablet Commonly known as: HYZAAR Take 1 tablet by mouth daily.   multivitamin with minerals Tabs tablet Take 1 tablet by mouth daily.   oxyCODONE 5 MG immediate release tablet Commonly known as: Oxy IR/ROXICODONE Take 1 tablet (5 mg total) by mouth every 6 (six) hours as needed for moderate pain (pain score 4-6) or severe pain (pain score 7-10).   pantoprazole 20 MG tablet Commonly known as: Protonix Take 1 tablet (20 mg total) by mouth daily.   RED YEAST RICE PO Take 1 capsule by mouth daily at 12 noon.   Travoprost (BAK Free) 0.004 % Soln ophthalmic solution Commonly known as: TRAVATAN Place 1 drop into both eyes in the morning and at bedtime.        Follow-up Information     [facility], Hedda Slade, [place]. Go on 10/27/2023.   Specialty: General Surgery Why: 1:30 PM. Please arrive 30 min prior to appointment time to complete check in paperwork. Contact information: [address] [facility] [place] [address] [phone]                Discharge Exam: Filed Weights   09/30/23 1333 10/03/23 0846  Weight: 88.6 kg 88.6 kg   General exam: NAD.  Patient feels very well without concerns or complaints and is asking to go home. Respiratory system:  CTA Cardiovascular system: S 1, S 2  RRR Gastrointestinal system:BS present, soft , nt Neuro: Fully alert and oriented.  No focal deficits. Extremities: No edema  Condition at discharge: Good  The results of significant diagnostics from this hospitalization (including imaging, microbiology, ancillary and laboratory) are listed below for reference.   Imaging Studies: DG ERCP Result Date: 10/05/2023 CLINICAL DATA:  78 year old male status post cholecystectomy. EXAM: ERCP TECHNIQUE: Multiple spot images obtained with the fluoroscopic device and submitted for interpretation post-procedure. FLUOROSCOPY TIME:  80.50 mGy COMPARISON:  10/02/2023,  10/03/2023 FINDINGS: Seven fluoroscopic images are submitted. Endoscope is visualized positioned in the second portion the duodenum. There is retrograde cannulation of the common bile duct. Retrograde cholangiogram demonstrates no evidence of significant intra or extrahepatic biliary ductal dilation. IMPRESSION: ERCP as above. These images were submitted for radiologic interpretation only. Please see the procedural report for full procedural details, the amount of contrast, and the fluoroscopy time utilized. Marliss Coots, MD [facility] Electronically Signed   By: Marliss Coots M.D.   On: 10/05/2023 07:14   DG Cholangiogram Operative Result Date: 10/03/2023 CLINICAL DATA:  Cholelithiasis.  ERCP. EXAM: INTRAOPERATIVE CHOLANGIOGRAM TECHNIQUE: Cholangiographic images from the C-arm fluoroscopic device were submitted for interpretation post-operatively. Please see the procedural report for the amount of contrast and the fluoroscopy time utilized. FLUOROSCOPY: Radiation Exposure Index (as provided by the fluoroscopic device): 35.63 mGy Kerma COMPARISON:  None Available. FINDINGS: Four intraoperative fluoroscopic spot images provided. The total fluoroscopic time is 59 second with cumulative air Karma of 35.63 mGy. Contrast opacifies the biliary tree. IMPRESSION: Intraoperative cholangiogram. Electronically Signed   By: Elgie Collard M.D.   On: 10/03/2023 13:10   DG ERCP Result Date: 10/02/2023 CLINICAL DATA:  Attempted but aborted ERCP.  Biliary stenosis. EXAM: ERCP TECHNIQUE: Multiple spot images obtained with the fluoroscopic device and submitted for interpretation post-procedure. FLUOROSCOPY: Radiation Exposure  Index (as provided by the fluoroscopic device): 0.23 mGy Kerma COMPARISON:  MRCP 10/01/2023 FINDINGS: A single saved intra procedural images submitted for review. The image demonstrates the patient's right upper quadrant. No medical devices are  visualized. Per notes, the procedure was aborted. IMPRESSION: Aborted ERCP. These images were submitted for radiologic interpretation only. Please see the procedural report for the amount of contrast and the fluoroscopy time utilized. Electronically Signed   By: Malachy Moan M.D.   On: 10/02/2023 09:09   MR ABDOMEN MRCP W WO CONTAST Result Date: 10/01/2023 CLINICAL DATA:  Biliary obstruction suspected, biliary ductal dilatation identified by prior CT nausea and vomiting EXAM: MRI ABDOMEN WITHOUT AND WITH CONTRAST (INCLUDING MRCP) TECHNIQUE: Multiplanar multisequence MR imaging of the abdomen was performed both before and after the administration of intravenous contrast. Heavily T2-weighted images of the biliary and pancreatic ducts were obtained, and three-dimensional MRCP images were rendered by post processing. CONTRAST:  8mL GADAVIST GADOBUTROL 1 MMOL/ML IV SOLN COMPARISON:  CT abdomen pelvis, 09/30/2023 FINDINGS: Lower chest: No acute abnormality. Hepatobiliary: No solid liver abnormality is seen. Substantial intra and extrahepatic biliary ductal dilatation, common bile duct measuring up to 1.1 cm in caliber. The duct tapers smoothly to the ampulla; there appears to be layering sludge or gravel at the ampulla without discrete calculi (series 3, image 18, series 4, image 27). Mild gallbladder wall thickening. Enhancing exophytic mural nodule of the anterior medial aspect of the gallbladder wall measuring 1.5 x 0.9 cm (series 13, image 48). Pancreas: Unremarkable. No pancreatic ductal dilatation or surrounding inflammatory changes. Spleen: Normal in size without significant abnormality. Adrenals/Urinary Tract: Adrenal glands are unremarkable. Simple, benign cyst of the superior pole of the right kidney, requiring no specific further follow-up or characterization. Kidneys are otherwise normal, without obvious renal calculi, solid lesion, or hydronephrosis. Stomach/Bowel: Stomach is within normal limits.  Descending periampullary duodenal diverticulum. No evidence of bowel wall thickening, distention, or inflammatory changes. Pancolonic diverticulosis. Vascular/Lymphatic: No significant vascular findings are present. No enlarged abdominal lymph nodes. Other: No abdominal wall hernia or abnormality. No ascites. Musculoskeletal: No acute or significant osseous findings. IMPRESSION: 1. Substantial intra and extrahepatic biliary ductal dilatation, common bile duct measuring up to 1.1 cm in caliber. The duct tapers smoothly to the ampulla; there appears to be layering sludge or gravel at the ampulla without discrete calculi. No obstructing mass or other obstructing lesion identified. 2. Mild, diffuse gallbladder wall thickening, concerning for cholecystitis. 3. Enhancing exophytic mural nodule of the anterior medial aspect of the gallbladder wall measuring 1.5 x 0.9 cm, suspicious for gallbladder neoplasm, possibly reflecting very prominent focal adenomyomatosis. These results will be called to the ordering clinician or representative by the Radiologist Assistant, and communication documented in the PACS or [employer]. Electronically Signed   By: Jearld Lesch M.D.   On: 10/01/2023 09:42   MR 3D Recon At Scanner Result Date: 10/01/2023 CLINICAL DATA:  Biliary obstruction suspected, biliary ductal dilatation identified by prior CT nausea and vomiting EXAM: MRI ABDOMEN WITHOUT AND WITH CONTRAST (INCLUDING MRCP) TECHNIQUE: Multiplanar multisequence MR imaging of the abdomen was performed both before and after the administration of intravenous contrast. Heavily T2-weighted images of the biliary and pancreatic ducts were obtained, and three-dimensional MRCP images were rendered by post processing. CONTRAST:  8mL GADAVIST GADOBUTROL 1 MMOL/ML IV SOLN COMPARISON:  CT abdomen pelvis, 09/30/2023 FINDINGS: Lower chest: No acute abnormality. Hepatobiliary: No solid liver abnormality is seen. Substantial intra and  extrahepatic biliary ductal dilatation, common bile duct  measuring up to 1.1 cm in caliber. The duct tapers smoothly to the ampulla; there appears to be layering sludge or gravel at the ampulla without discrete calculi (series 3, image 18, series 4, image 27). Mild gallbladder wall thickening. Enhancing exophytic mural nodule of the anterior medial aspect of the gallbladder wall measuring 1.5 x 0.9 cm (series 13, image 48). Pancreas: Unremarkable. No pancreatic ductal dilatation or surrounding inflammatory changes. Spleen: Normal in size without significant abnormality. Adrenals/Urinary Tract: Adrenal glands are unremarkable. Simple, benign cyst of the superior pole of the right kidney, requiring no specific further follow-up or characterization. Kidneys are otherwise normal, without obvious renal calculi, solid lesion, or hydronephrosis. Stomach/Bowel: Stomach is within normal limits. Descending periampullary duodenal diverticulum. No evidence of bowel wall thickening, distention, or inflammatory changes. Pancolonic diverticulosis. Vascular/Lymphatic: No significant vascular findings are present. No enlarged abdominal lymph nodes. Other: No abdominal wall hernia or abnormality. No ascites. Musculoskeletal: No acute or significant osseous findings. IMPRESSION: 1. Substantial intra and extrahepatic biliary ductal dilatation, common bile duct measuring up to 1.1 cm in caliber. The duct tapers smoothly to the ampulla; there appears to be layering sludge or gravel at the ampulla without discrete calculi. No obstructing mass or other obstructing lesion identified. 2. Mild, diffuse gallbladder wall thickening, concerning for cholecystitis. 3. Enhancing exophytic mural nodule of the anterior medial aspect of the gallbladder wall measuring 1.5 x 0.9 cm, suspicious for gallbladder neoplasm, possibly reflecting very prominent focal adenomyomatosis. These results will be called to the ordering clinician or representative by  the Radiologist Assistant, and communication documented in the PACS or [employer]. Electronically Signed   By: Jearld Lesch M.D.   On: 10/01/2023 09:42   CT ABDOMEN PELVIS W CONTRAST Result Date: 09/30/2023 CLINICAL DATA:  Transaminitis with nausea and vomiting. EXAM: CT ABDOMEN AND PELVIS WITH CONTRAST TECHNIQUE: Multidetector CT imaging of the abdomen and pelvis was performed using the standard protocol following bolus administration of intravenous contrast. RADIATION DOSE REDUCTION: This exam was performed according to the departmental dose-optimization program which includes automated exposure control, adjustment of the mA and/or kV according to patient size and/or use of iterative reconstruction technique. CONTRAST:  OMNIPAQUE IOHEXOL 300 MG/ML  SOLN COMPARISON:  Abdominal ultrasound 05/25/2023. MRI abdomen 04/26/2018 FINDINGS: Lower chest: Chronic interstitial changes noted in the lung bases. Hepatobiliary: Scattered tiny hypodensities in the liver parenchyma are too small to characterize but are statistically most likely benign. No followup imaging is recommended. No suspicious enhancing liver lesion. Intrahepatic biliary duct dilatation is new since previous MRI. Common bile duct in the head of the pancreas measures up to 9 mm diameter with relatively abrupt tapering that appears to occur just proximal to the ampulla (axial 35/2 and well seen coronal 70/7). No calcified stones in the gallbladder or common bile duct. 11 x 9 mm apparent mural nodule is seen in the anterior wall of the gallbladder on axial 26/2 and coronal 63/7. This appears to be a mural lesion is opposed to a mucosal abnormality such as a polyp. Pancreas: No focal mass lesion. No dilatation of the main duct. No intraparenchymal cyst. No peripancreatic edema. Spleen: No splenomegaly. No suspicious focal mass lesion. Adrenals/Urinary Tract: No adrenal nodule or mass. 7.1 cm simple cyst noted upper pole right kidney increased  from 5.5 cm on previous MRI. No followup imaging is recommended. Kidneys otherwise unremarkable. No evidence for hydroureter. The urinary bladder appears normal for the degree of distention. Stomach/Bowel: Stomach is unremarkable. No gastric wall thickening. No  evidence of outlet obstruction. Duodenum is normally positioned as is the ligament of Treitz. Duodenal diverticulum noted. No small bowel wall thickening. No small bowel dilatation. The terminal ileum is normal. The appendix is normal. No gross colonic mass. No colonic wall thickening. Diverticuli are seen scattered along the entire length of the colon without CT findings of diverticulitis. Vascular/Lymphatic: There is mild atherosclerotic calcification of the abdominal aorta without aneurysm. Borderline enlarged lymph nodes are seen in the hepatoduodenal ligament, likely reactive. No retroperitoneal lymphadenopathy. No pelvic sidewall lymphadenopathy. Reproductive: The prostate gland and seminal vesicles are unremarkable. Other: No intraperitoneal free fluid. Musculoskeletal: Small bilateral groin hernias, left greater than right contain only fat. Small fat containing umbilical hernia evident. No worrisome lytic or sclerotic osseous abnormality. IMPRESSION: 1. Intra and extrahepatic biliary duct dilatation appears new since previous MRI from 2019. These findings are also new since a chest CT of 08/10/2021 that included the liver and common bile duct. Common bile duct tapers relatively abruptly in the head of the pancreas just proximal to the ampulla without obstructing mass lesion or stone evident by CT imaging. MRCP may provide additional characterization, but ultimately, ERCP may be warranted as benign stricture could have this appearance. 2. 11 x 9 mm mural nodule mid gallbladder. This appears to be mural based rather than a mucosal lesion such as polyp. MRI abdomen with and without contrast may prove helpful to further evaluate. 3. Diffuse colonic  diverticulosis without diverticulitis. 4.  Aortic Atherosclerois (ICD10-170.0) Electronically Signed   By: Kennith Center M.D.   On: 09/30/2023 09:41   DG Chest 2 View Result Date: 09/30/2023 CLINICAL DATA:  Cough.  Fever.  Body aches. EXAM: CHEST - 2 VIEW COMPARISON:  07/01/2021 FINDINGS: The lungs are clear without focal pneumonia, edema, pneumothorax or pleural effusion. Scattered areas of architectural distortion/scarring are noted bilaterally, better characterized on recent chest CT of 06/21/2023. No suspicious pulmonary nodule or mass. No acute bony abnormality. Telemetry leads overlie the chest. IMPRESSION: No acute cardiopulmonary disease. Electronically Signed   By: Kennith Center M.D.   On: 09/30/2023 06:12    Microbiology: Results for orders placed or performed during the hospital encounter of 09/30/23  Resp panel by RT-PCR (RSV, Flu A&B, Covid) Anterior Nasal Swab     Status: None   Collection Time: 09/30/23  6:13 AM   Specimen: Anterior Nasal Swab  Result Value Ref Range Status   SARS Coronavirus 2 by RT PCR NEGATIVE NEGATIVE Final    Comment: (NOTE) SARS-CoV-2 target nucleic acids are NOT DETECTED.  The SARS-CoV-2 RNA is generally detectable in upper respiratory specimens during the acute phase of infection. The lowest concentration of SARS-CoV-2 viral copies this assay can detect is 138 copies/mL. A negative result does not preclude SARS-Cov-2 infection and should not be used as the sole basis for treatment or other patient management decisions. A negative result may occur with  improper specimen collection/handling, submission of specimen other than nasopharyngeal swab, presence of viral mutation(s) within the areas targeted by this assay, and inadequate number of viral copies(<138 copies/mL). A negative result must be combined with clinical observations, patient history, and epidemiological information. The expected result is Negative.  Fact Sheet for Patients:   BloggerCourse.com  Fact Sheet for Healthcare Providers:  SeriousBroker.it  This test is no t yet approved or cleared by the [place] FDA and  has been authorized for detection and/or diagnosis of SARS-CoV-2 by FDA under an Emergency Use Authorization (EUA). This EUA will remain  in  effect (meaning this test can be used) for the duration of the COVID-19 declaration under Section 564(b)(1) of the Act, 21 U.S.C.section 360bbb-3(b)(1), unless the authorization is terminated  or revoked sooner.       Influenza A by PCR NEGATIVE NEGATIVE Final   Influenza B by PCR NEGATIVE NEGATIVE Final    Comment: (NOTE) The Xpert Xpress SARS-CoV-2/FLU/RSV plus assay is intended as an aid in the diagnosis of influenza from Nasopharyngeal swab specimens and should not be used as a sole basis for treatment. Nasal washings and aspirates are unacceptable for Xpert Xpress SARS-CoV-2/FLU/RSV testing.  Fact Sheet for Patients: BloggerCourse.com  Fact Sheet for Healthcare Providers: SeriousBroker.it  This test is not yet approved or cleared by the [place] FDA and has been authorized for detection and/or diagnosis of SARS-CoV-2 by FDA under an Emergency Use Authorization (EUA). This EUA will remain in effect (meaning this test can be used) for the duration of the COVID-19 declaration under Section 564(b)(1) of the Act, 21 U.S.C. section 360bbb-3(b)(1), unless the authorization is terminated or revoked.     Resp Syncytial Virus by PCR NEGATIVE NEGATIVE Final    Comment: (NOTE) Fact Sheet for Patients: BloggerCourse.com  Fact Sheet for Healthcare Providers: SeriousBroker.it  This test is not yet approved or cleared by the [place] FDA and has been authorized for detection and/or diagnosis of SARS-CoV-2 by FDA under an Emergency Use  Authorization (EUA). This EUA will remain in effect (meaning this test can be used) for the duration of the COVID-19 declaration under Section 564(b)(1) of the Act, 21 U.S.C. section 360bbb-3(b)(1), unless the authorization is terminated or revoked.  Performed at [facility], [address], [place], [place] [address]     Labs: CBC: Recent Labs  Lab 10/01/23 [phone] 10/02/23 0504 10/03/23 0437 10/04/23 0506 10/05/23 0656  WBC 9.4 7.4 8.8 10.0 8.3  HGB 10.9* 10.7* 10.8* 10.2* 11.1*  HCT 33.6* 33.5* 34.1* 32.9* 35.2*  MCV 87.7 89.3 90.0 90.9 90.7  PLT 240 243 273 287 320   Basic Metabolic Panel: Recent Labs  Lab 09/30/23 1354 10/01/23 0521 10/02/23 0504 10/03/23 0437 10/04/23 0506 10/05/23 0656  NA  --  138 139 139 139 140  K  --  3.2* 4.2 4.1 3.9 3.4*  CL  --  106 110 110 110 106  CO2  --  24 23 23 25 25   GLUCOSE  --  101* 120* 131* 111* 80  BUN  --  15 14 10 15 12   CREATININE  --  0.82 0.90 0.64 0.91 0.82  CALCIUM  --  8.1* 8.0* 8.3* 8.0* 8.2*  MG 2.0  --   --   --   --   --    Liver Function Tests: Recent Labs  Lab 10/01/23 0521 10/02/23 0504 10/03/23 0437 10/04/23 0506 10/05/23 0656  AST 123* 110* 67* 88* 62*  ALT 195* 171* 142* 134* 116*  ALKPHOS 246* 223* 210* 181* 199*  BILITOT 7.0* 5.1* 2.5* 2.0* 2.2*  PROT 6.3* 5.8* 6.0* 5.5* 6.2*  ALBUMIN 2.7* 2.4* 2.6* 2.3* 2.6*   CBG: No results for input(s): "GLUCAP" in the last 168 hours.  Discharge time spent: Less than 30 minutes.  Signed: Tobey Grim, MD [facility] 10/05/2023

## 2023-10-05 NOTE — Progress Notes (Signed)
 Mobility Specialist - Progress Note   10/05/23 0833  Mobility  Activity Ambulated independently in hallway  Level of Assistance Independent  Assistive Device Other (Comment) (IV Pole)  Distance Ambulated (ft) 500 ft  Activity Response Tolerated well  Mobility Referral Yes  Mobility visit 1 Mobility  Mobility Specialist Start Time (ACUTE ONLY) 0825  Mobility Specialist Stop Time (ACUTE ONLY) [id number]  Mobility Specialist Time Calculation (min) (ACUTE ONLY) 7 min   Pt received EOB and agreeable to mobility. No complaints during session. Pt to EOB after session with all needs met.    [facility]

## 2023-10-05 NOTE — Progress Notes (Signed)
 [facility] Gastroenterology Progress Note  Andrew Douglas 78 y.o. 03-Apr-1946   Subjective: Feels good. Tolerating solid food. Denies abdominal pain. Excited about going home.  Objective: Vital signs: Vitals:   10/04/23 2106 10/05/23 0105  BP: (!) 141/78 130/72  Pulse: 62 (!) 59  Resp: 16 18  Temp: 97.9 F (36.6 C) 97.6 F (36.4 C)  SpO2: 97% 97%    Physical Exam: Gen: alert, no acute distress, well-nourished, pleasant  HEENT: anicteric sclera CV: RRR Chest: CTA B Abd: soft, nontender, nondistended, +BS Ext: no edema  Lab Results: Recent Labs    10/04/23 0506 10/05/23 0656  NA 139 140  K 3.9 3.4*  CL 110 106  CO2 25 25  GLUCOSE 111* 80  BUN 15 12  CREATININE 0.91 0.82  CALCIUM 8.0* 8.2*   Recent Labs    10/04/23 0506 10/05/23 0656  AST 88* 62*  ALT 134* 116*  ALKPHOS 181* 199*  BILITOT 2.0* 2.2*  PROT 5.5* 6.2*  ALBUMIN 2.3* 2.6*   Recent Labs    10/04/23 0506 10/05/23 0656  WBC 10.0 8.3  HGB 10.2* 11.1*  HCT 32.9* 35.2*  MCV 90.9 90.7  PLT 287 320      Assessment/Plan: Acute cholecystitis with elevated LFTs with abnormal IOC and MRCP concerning for biliary obstruction - s/p ERCP with sphincterotomy and dilation of bile duct without definite stone seen. Slight increase in TB and ALP and decrease in AST/ALT. Ok to go home from GI standpoint with outpt f/u of LFTs. Will sign off. Call if questions.   Shirley Friar 10/05/2023, 10:44 AM  Questions please call [phone] ID: Andrew Douglas, male   DOB: 1945-11-10, 78 y.o.   MRN: [id number]

## 2023-10-05 NOTE — Discharge Instructions (Signed)

## 2023-10-05 NOTE — Progress Notes (Signed)
 Progress Note  1 Day Post-Op  Subjective: Pt doing well, having bowel function and tolerating diet. Ready to go home today.   Objective: Vital signs in last 24 hours: Temp:  [97.2 F (36.2 C)-97.9 F (36.6 C)] 97.6 F (36.4 C) (03/26 0105) Pulse Rate:  [59-77] 59 (03/26 0105) Resp:  [15-18] 18 (03/26 0105) BP: (118-168)/(72-84) 130/72 (03/26 0105) SpO2:  [93 %-98 %] 97 % (03/26 0105) Last BM Date : 10/04/23  Intake/Output from previous day: 03/25 0701 - 03/26 0700 In: 1981.1 [P.O.:600; I.V.:1302; IV Piggyback:79.1] Out: 5 [Blood:5] Intake/Output this shift: No intake/output data recorded.  PE: General: pleasant, WD, WN male who is laying in bed in NAD HEENT: sclera anicteric  Heart: regular, rate, and rhythm.   Lungs: Respiratory effort nonlabored Abd: soft, appropriately TTP, mild distention, incisions C/D/I Psych: A&Ox3 with an appropriate affect.    Lab Results:  Recent Labs    10/04/23 0506 10/05/23 0656  WBC 10.0 8.3  HGB 10.2* 11.1*  HCT 32.9* 35.2*  PLT 287 320   BMET Recent Labs    10/04/23 0506 10/05/23 0656  NA 139 140  K 3.9 3.4*  CL 110 106  CO2 25 25  GLUCOSE 111* 80  BUN 15 12  CREATININE 0.91 0.82  CALCIUM 8.0* 8.2*   PT/INR No results for input(s): "LABPROT", "INR" in the last 72 hours.  CMP     Component Value Date/Time   NA 140 10/05/2023 0656   K 3.4 (L) 10/05/2023 0656   CL 106 10/05/2023 0656   CO2 25 10/05/2023 0656   GLUCOSE 80 10/05/2023 0656   BUN 12 10/05/2023 0656   CREATININE 0.82 10/05/2023 0656   CALCIUM 8.2 (L) 10/05/2023 0656   PROT 6.2 (L) 10/05/2023 0656   ALBUMIN 2.6 (L) 10/05/2023 0656   AST 62 (H) 10/05/2023 0656   ALT 116 (H) 10/05/2023 0656   ALKPHOS 199 (H) 10/05/2023 0656   BILITOT 2.2 (H) 10/05/2023 0656   GFRNONAA >60 10/05/2023 0656   Lipase     Component Value Date/Time   LIPASE 20 09/30/2023 0617       Studies/Results: DG ERCP Result Date: 10/05/2023 CLINICAL DATA:  78 year old  male status post cholecystectomy. EXAM: ERCP TECHNIQUE: Multiple spot images obtained with the fluoroscopic device and submitted for interpretation post-procedure. FLUOROSCOPY TIME:  80.50 mGy COMPARISON:  10/02/2023, 10/03/2023 FINDINGS: Seven fluoroscopic images are submitted. Endoscope is visualized positioned in the second portion the duodenum. There is retrograde cannulation of the common bile duct. Retrograde cholangiogram demonstrates no evidence of significant intra or extrahepatic biliary ductal dilation. IMPRESSION: ERCP as above. These images were submitted for radiologic interpretation only. Please see the procedural report for full procedural details, the amount of contrast, and the fluoroscopy time utilized. Marliss Coots, MD [facility] Radiology Electronically Signed   By: Marliss Coots M.D.   On: 10/05/2023 07:14   DG Cholangiogram Operative Result Date: 10/03/2023 CLINICAL DATA:  Cholelithiasis.  ERCP. EXAM: INTRAOPERATIVE CHOLANGIOGRAM TECHNIQUE: Cholangiographic images from the C-arm fluoroscopic device were submitted for interpretation post-operatively. Please see the procedural report for the amount of contrast and the fluoroscopy time utilized. FLUOROSCOPY: Radiation Exposure Index (as provided by the fluoroscopic device): 35.63 mGy Kerma COMPARISON:  None Available. FINDINGS: Four intraoperative fluoroscopic spot images provided. The total fluoroscopic time is 59 second with cumulative air Karma of 35.63 mGy. Contrast opacifies the biliary tree. IMPRESSION: Intraoperative cholangiogram. Electronically Signed   By: Ceasar Mons.D.  On: 10/03/2023 13:10    Anti-infectives: Anti-infectives (From admission, onward)    Start     Dose/Rate Route Frequency Ordered Stop   10/03/23 0845  ceFAZolin (ANCEF) IVPB 2g/100 mL premix        2 g 200 mL/hr over 30 Minutes Intravenous On call to O.R. 10/03/23 0347 10/03/23 0931   10/01/23 1200   Ampicillin-Sulbactam (UNASYN) 3 g in sodium chloride 0.9 % 100 mL IVPB        3 g 200 mL/hr over 30 Minutes Intravenous Every 6 hours 10/01/23 1028          Assessment/Plan  Acute cholecystitis  Elevated LFTs with dilated CBD POD2 s/p laparoscopic cholecystectomy with IOC - s/p ERCP yesterday - stable for DC from surgical standpoint  - no further abx needed from surgery standpoint   FEN: CLD, IVF per [facility]  VTE: SCDs ID: ancef pre-op; unasyn  - per [facility] -  HTN Hx of Hep C s/p tx  LOS: 5 days     Juliet Rude, [facility] 10/05/2023, 9:07 AM Please see Amion for pager number during day hours 7:00am-4:30pm

## 2023-10-06 ENCOUNTER — Encounter (HOSPITAL_COMMUNITY): Payer: Self-pay | Admitting: Gastroenterology

## 2023-10-12 DIAGNOSIS — R7401 Elevation of levels of liver transaminase levels: Secondary | ICD-10-CM | POA: Diagnosis not present

## 2023-10-12 DIAGNOSIS — E876 Hypokalemia: Secondary | ICD-10-CM | POA: Diagnosis not present

## 2023-10-26 DIAGNOSIS — K9189 Other postprocedural complications and disorders of digestive system: Secondary | ICD-10-CM | POA: Diagnosis not present

## 2023-10-26 DIAGNOSIS — R748 Abnormal levels of other serum enzymes: Secondary | ICD-10-CM | POA: Diagnosis not present

## 2023-10-26 DIAGNOSIS — Z8619 Personal history of other infectious and parasitic diseases: Secondary | ICD-10-CM | POA: Diagnosis not present

## 2023-10-26 DIAGNOSIS — K7402 Hepatic fibrosis, advanced fibrosis: Secondary | ICD-10-CM | POA: Diagnosis not present

## 2023-10-31 ENCOUNTER — Encounter (HOSPITAL_COMMUNITY): Payer: Self-pay | Admitting: Emergency Medicine

## 2023-10-31 ENCOUNTER — Inpatient Hospital Stay (HOSPITAL_COMMUNITY)
Admission: EM | Admit: 2023-10-31 | Discharge: 2023-11-02 | DRG: 444 | Disposition: A | Discharge status: Home / Self Care | Attending: Internal Medicine | Admitting: Internal Medicine

## 2023-10-31 ENCOUNTER — Emergency Department (HOSPITAL_COMMUNITY)

## 2023-10-31 ENCOUNTER — Other Ambulatory Visit: Payer: Self-pay

## 2023-10-31 DIAGNOSIS — Z79899 Other long term (current) drug therapy: Secondary | ICD-10-CM | POA: Diagnosis not present

## 2023-10-31 DIAGNOSIS — K7460 Unspecified cirrhosis of liver: Secondary | ICD-10-CM | POA: Diagnosis present

## 2023-10-31 DIAGNOSIS — Z4682 Encounter for fitting and adjustment of non-vascular catheter: Secondary | ICD-10-CM | POA: Diagnosis not present

## 2023-10-31 DIAGNOSIS — R112 Nausea with vomiting, unspecified: Principal | ICD-10-CM | POA: Diagnosis present

## 2023-10-31 DIAGNOSIS — Z9049 Acquired absence of other specified parts of digestive tract: Secondary | ICD-10-CM | POA: Diagnosis not present

## 2023-10-31 DIAGNOSIS — K5750 Diverticulosis of both small and large intestine without perforation or abscess without bleeding: Secondary | ICD-10-CM | POA: Diagnosis not present

## 2023-10-31 DIAGNOSIS — N3289 Other specified disorders of bladder: Secondary | ICD-10-CM | POA: Diagnosis not present

## 2023-10-31 DIAGNOSIS — K831 Obstruction of bile duct: Secondary | ICD-10-CM | POA: Diagnosis present

## 2023-10-31 DIAGNOSIS — I700 Atherosclerosis of aorta: Secondary | ICD-10-CM | POA: Diagnosis not present

## 2023-10-31 DIAGNOSIS — E876 Hypokalemia: Secondary | ICD-10-CM | POA: Diagnosis present

## 2023-10-31 DIAGNOSIS — Z7982 Long term (current) use of aspirin: Secondary | ICD-10-CM | POA: Diagnosis not present

## 2023-10-31 DIAGNOSIS — K4090 Unilateral inguinal hernia, without obstruction or gangrene, not specified as recurrent: Secondary | ICD-10-CM | POA: Diagnosis not present

## 2023-10-31 DIAGNOSIS — Z885 Allergy status to narcotic agent status: Secondary | ICD-10-CM | POA: Diagnosis not present

## 2023-10-31 DIAGNOSIS — R7989 Other specified abnormal findings of blood chemistry: Secondary | ICD-10-CM

## 2023-10-31 DIAGNOSIS — I10 Essential (primary) hypertension: Secondary | ICD-10-CM | POA: Diagnosis present

## 2023-10-31 DIAGNOSIS — K4020 Bilateral inguinal hernia, without obstruction or gangrene, not specified as recurrent: Secondary | ICD-10-CM | POA: Diagnosis not present

## 2023-10-31 DIAGNOSIS — K8309 Other cholangitis: Secondary | ICD-10-CM | POA: Diagnosis not present

## 2023-10-31 DIAGNOSIS — R748 Abnormal levels of other serum enzymes: Secondary | ICD-10-CM | POA: Diagnosis not present

## 2023-10-31 DIAGNOSIS — Z87891 Personal history of nicotine dependence: Secondary | ICD-10-CM

## 2023-10-31 DIAGNOSIS — R932 Abnormal findings on diagnostic imaging of liver and biliary tract: Secondary | ICD-10-CM | POA: Diagnosis not present

## 2023-10-31 DIAGNOSIS — Z8619 Personal history of other infectious and parasitic diseases: Secondary | ICD-10-CM | POA: Diagnosis not present

## 2023-10-31 DIAGNOSIS — Z9889 Other specified postprocedural states: Secondary | ICD-10-CM | POA: Diagnosis not present

## 2023-10-31 DIAGNOSIS — D649 Anemia, unspecified: Secondary | ICD-10-CM | POA: Diagnosis not present

## 2023-10-31 DIAGNOSIS — R109 Unspecified abdominal pain: Secondary | ICD-10-CM | POA: Diagnosis not present

## 2023-10-31 DIAGNOSIS — R7401 Elevation of levels of liver transaminase levels: Secondary | ICD-10-CM | POA: Diagnosis not present

## 2023-10-31 DIAGNOSIS — K838 Other specified diseases of biliary tract: Secondary | ICD-10-CM | POA: Diagnosis not present

## 2023-10-31 DIAGNOSIS — R17 Unspecified jaundice: Secondary | ICD-10-CM | POA: Diagnosis not present

## 2023-10-31 LAB — URINALYSIS, ROUTINE W REFLEX MICROSCOPIC
Bacteria, UA: NONE SEEN
Glucose, UA: NEGATIVE mg/dL
Hgb urine dipstick: NEGATIVE
Ketones, ur: NEGATIVE mg/dL
Leukocytes,Ua: NEGATIVE
Nitrite: NEGATIVE
Protein, ur: 100 mg/dL — AB
Specific Gravity, Urine: 1.03 (ref 1.005–1.030)
pH: 5 (ref 5.0–8.0)

## 2023-10-31 LAB — CBC
HCT: 37.5 % — ABNORMAL LOW (ref 39.0–52.0)
Hemoglobin: 11.8 g/dL — ABNORMAL LOW (ref 13.0–17.0)
MCH: 27.8 pg (ref 26.0–34.0)
MCHC: 31.5 g/dL (ref 30.0–36.0)
MCV: 88.2 fL (ref 80.0–100.0)
Platelets: 261 10*3/uL (ref 150–400)
RBC: 4.25 MIL/uL (ref 4.22–5.81)
RDW: 14.1 % (ref 11.5–15.5)
WBC: 8.8 10*3/uL (ref 4.0–10.5)
nRBC: 0 % (ref 0.0–0.2)

## 2023-10-31 LAB — COMPREHENSIVE METABOLIC PANEL WITH GFR
ALT: 157 U/L — ABNORMAL HIGH (ref 0–44)
AST: 155 U/L — ABNORMAL HIGH (ref 15–41)
Albumin: 3 g/dL — ABNORMAL LOW (ref 3.5–5.0)
Alkaline Phosphatase: 268 U/L — ABNORMAL HIGH (ref 38–126)
Anion gap: 9 (ref 5–15)
BUN: 16 mg/dL (ref 8–23)
CO2: 25 mmol/L (ref 22–32)
Calcium: 8.7 mg/dL — ABNORMAL LOW (ref 8.9–10.3)
Chloride: 104 mmol/L (ref 98–111)
Creatinine, Ser: 0.82 mg/dL (ref 0.61–1.24)
GFR, Estimated: >60 mL/min (ref >60)
Glucose, Bld: 115 mg/dL — ABNORMAL HIGH (ref 70–99)
Potassium: 3 mmol/L — ABNORMAL LOW (ref 3.5–5.1)
Sodium: 138 mmol/L (ref 135–145)
Total Bilirubin: 10.5 mg/dL — ABNORMAL HIGH (ref 0.0–1.2)
Total Protein: 7.3 g/dL (ref 6.5–8.1)

## 2023-10-31 LAB — LIPASE, BLOOD: Lipase: 25 U/L (ref 11–51)

## 2023-10-31 MED ORDER — IOHEXOL 300 MG/ML  SOLN
100.0000 mL | Freq: Once | INTRAMUSCULAR | Status: AC | PRN
  Administered 2023-10-31: 100 mL via INTRAVENOUS

## 2023-10-31 NOTE — ED Triage Notes (Signed)
 Pt reports he had gallbladder removed 2 weeks ago and reports since the surgery he has been vomiting. Pt stating his urine is dark in color. Pt reports he had follow up with the surgeon and reported elevated liver enzymes.

## 2023-10-31 NOTE — ED Notes (Signed)
 Pt to ct via stretcher

## 2023-10-31 NOTE — ED Provider Notes (Signed)
 [facility] AT [facility] Provider Note   CSN: [id number] Arrival date & time: 10/31/23  1243     History {Add pertinent medical, surgical, social history, OB history to HPI:1} Chief Complaint  Patient presents with   Emesis    Andrew Douglas is a 78 y.o. 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 since he had a cholecystectomy 2 weeks ago.  States he has been having nausea vomiting since the day he got home from surgery.  He denies any significant abdominal pain just vomiting.  Denies hematemesis, hematochezia, melena.  Has had bowel movements that are hard since the surgery.  Also states his urine is very dark now. Pt reports he had follow up with the surgeon and reported elevated liver enzymes.   Past Medical History:  Diagnosis Date   Hepatitis C    Hypertension        Home Medications Prior to Admission medications   Medication Sig Start Date End Date Taking? Authorizing Provider  ascorbic acid (VITAMIN C) 500 MG tablet Take 500 mg by mouth daily.   Yes [provider]  aspirin EC 81 MG tablet Take 81 mg by mouth daily. Swallow whole.   Yes [provider]  benzonatate  (TESSALON ) 200 MG capsule Take 200 mg by mouth 3 (three) times daily as needed for cough.   Yes [provider]  dorzolamide -timolol  (COSOPT ) 2-0.5 % ophthalmic solution Place 1 drop into both eyes 2 (two) times daily.   Yes [provider]  losartan-hydrochlorothiazide (HYZAAR) 100-12.5 MG tablet Take 1 tablet by mouth daily.   Yes [provider]  Multiple Vitamin (MULTIVITAMIN WITH MINERALS) TABS tablet Take 1 tablet by mouth daily.   Yes [provider]  omeprazole  (PRILOSEC OTC) 20 MG tablet Take 20 mg by mouth daily. 09/30/23  Yes [provider]  Red Yeast Rice Extract (RED YEAST RICE PO) Take 1 capsule by mouth  daily at 12 noon.   Yes [provider]  Travoprost, BAK Free, (TRAVATAN) 0.004 % SOLN ophthalmic solution Place 1 drop into both eyes at bedtime. 09/10/20  Yes [provider]  oxyCODONE  (OXY IR/ROXICODONE ) 5 MG immediate release tablet Take 1 tablet (5 mg total) by mouth every 6 (six) hours as needed for moderate pain (pain score 4-6) or severe pain (pain score 7-10). Patient not taking: Reported on 10/31/2023 10/05/23   Annetta Killian, PA-C  pantoprazole  (PROTONIX ) 20 MG tablet Take 1 tablet (20 mg total) by mouth daily. Patient not taking: Reported on 09/30/2023 06/16/22   Mannam, Praveen, MD      Allergies    Oxycontin  [oxycodone ]    Review of Systems   Review of Systems A 10 point review of systems was performed and is negative unless otherwise reported in HPI.  Physical Exam Updated Vital Signs BP 112/72   Pulse 69   Temp (!) 97.5 F (36.4 C)   Resp 16   SpO2 95%  Physical Exam General: Normal appearing male, lying in bed.  HEENT: Sclera anicteric***, MMM, trachea midline.  Cardiology: RRR, no murmurs/rubs/gallops. BL radial and DP pulses equal bilaterally.  Resp: Normal respiratory rate and effort. CTAB, no wheezes, rhonchi, crackles.  Abd: Soft, non-tender, non-distended. No rebound tenderness or guarding.  Well-healing laparoscopic abdominal incisions without any surrounding erythema or induration or purulent drainage. GU: Deferred. MSK: No peripheral edema or signs of trauma. Extremities without deformity or TTP.  Skin: warm, dry.  Back: No CVA tenderness Neuro: A&Ox4, CNs II-XII grossly intact. MAEs. Sensation grossly intact.  Psych: Normal mood and affect.   ED Results / Procedures / Treatments   Labs (all labs ordered are listed, but only abnormal results are displayed) Labs Reviewed  COMPREHENSIVE METABOLIC PANEL WITH GFR - Abnormal; Notable for the following components:      Result Value   Potassium 3.0 (*)    Glucose, Bld 115 (*)    Calcium  8.7 (*)    Albumin 3.0 (*)    AST 155 (*)    ALT 157 (*)    Alkaline Phosphatase 268 (*)    Total Bilirubin 10.5 (*)    All other components within normal limits  CBC - Abnormal; Notable for the following components:   Hemoglobin 11.8 (*)    HCT 37.5 (*)    All other components within normal limits  URINALYSIS, ROUTINE W REFLEX MICROSCOPIC - Abnormal; Notable for the following components:   Color, Urine AMBER (*)    Bilirubin Urine MODERATE (*)    Protein, ur 100 (*)    All other components within normal limits  LIPASE, BLOOD    EKG None  Radiology CT ABDOMEN PELVIS W CONTRAST Result Date: 11/01/2023 CLINICAL DATA:  Postop abdominal pain. Gallbladder removed 2 weeks ago. Since surgery he has been vomiting. Urine is dark in color. Elevated liver enzymes. EXAM: CT ABDOMEN AND PELVIS WITH CONTRAST TECHNIQUE: Multidetector CT imaging of the abdomen and pelvis was performed using the standard protocol following bolus administration of intravenous contrast. RADIATION DOSE REDUCTION: This exam was performed according to the departmental dose-optimization program which includes automated exposure control, adjustment of the mA and/or kV according to patient size and/or use of iterative reconstruction technique. CONTRAST:  OMNIPAQUE  IOHEXOL  300 MG/ML  SOLN COMPARISON:  MRI abdomen 10/01/2023 and CT abdomen pelvis 09/30/2023 FINDINGS: Lower chest: No acute abnormality. Hepatobiliary: Cholecystectomy. Trace stranding and fluid in the cholecystectomy bed is favored postoperative. Unchanged intra and extrahepatic biliary dilation. No radiopaque stone. The common bile duct again measures 1.1 cm. Scattered hepatic cysts. Pancreas: No pancreatic ductal dilation. No peripancreatic fat stranding. Spleen: Unremarkable. Adrenals/Urinary Tract: Unremarkable adrenal glands. No urinary calculi or hydronephrosis. 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 Stomach/Bowel:  Normal caliber large and small bowel. No bowel wall thickening. Colonic diverticulosis without diverticulitis. Normal appendix. Stomach is within normal limits. Duodenal diverticulum. Vascular/Lymphatic: Aortic atherosclerosis. No enlarged abdominal or pelvic lymph nodes. Reproductive: Unremarkable. Other: No free intraperitoneal fluid or air. Bilateral fat containing inguinal hernias greater on the left. The anterior left bladder partially herniates into the left inguinal hernia. Musculoskeletal: No acute fracture. IMPRESSION: 1. No acute abnormality in the abdomen or pelvis. 2. Interval cholecystectomy. Trace stranding and fluid in the cholecystectomy bed is favored postoperative. 3. Unchanged intra and extrahepatic biliary dilation. No radiopaque stone. 4. 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 5. Aortic Atherosclerosis (ICD10-I70.0). Electronically Signed   By: Rozell Cornet M.D.   On: 11/01/2023 00:05    Procedures Procedures  {Document cardiac monitor, telemetry assessment procedure when appropriate:1}  Medications Ordered in ED Medications  ondansetron  (ZOFRAN ) injection 4 mg (has no administration in time range)  iohexol  (OMNIPAQUE ) 300 MG/ML solution 100 mL (100 mLs Intravenous Contrast Given 10/31/23 2048)    ED Course/ Medical Decision Making/ A&P  Medical Decision Making Amount and/or Complexity of Data Reviewed Labs: ordered. Decision-making details documented in ED Course. Radiology: ordered. Decision-making details documented in ED Course.  Risk Prescription drug management. Decision regarding hospitalization.    This patient presents to the ED for concern of N/V, this involves an extensive number of treatment options, and is a complaint that carries with it a high risk of complications and morbidity.  I considered the following differential and admission for this acute, potentially life threatening condition.    MDM:    Patient with elevated LFTs and hyperbilirubinemia after cholecystectomy. C/f biliary stricture/leak. No significant electrolyte derangements or AKI. No c/o abdominal pain, no TTP, no signs of peritonitis on exam.   Seen by [facility] [place] [facility] on 10/27/23: "Xaivier Malay is doing well and healing appropriately without signs of infection. However, LFTs are still elevated. We discussed the possibility of repeating MRCP to evaluate for CBD stone, but ERCP was negative. I do not believe he has a bile leak based on his history/lack of symptoms. I will send Dr. Lavaughn [place], his gastroenterologist, a message so he is aware of his symptoms and lab results. They will call back on Monday after they speak to the hepatologist if she recommends any specific workup on our end."  Clinical Course as of 11/01/23 0020  Mon Oct 31, 2023  1802 Comprehensive metabolic panel(!) LFTs increasing and T bili now 10.5 post-op.  [HN]  1802 Lipase: 25 neg [HN]  1802 WBC: 8.8 No leukocytosis  [HN]  1802 Urinalysis, Routine w reflex microscopic -Urine, Clean Catch(!) Neg for UTI [HN]  2209 Still awaiting CT read. [HN]  2348 Calling [place] radi[facility] [HN]  Tue Nov 01, 2023  0008 CT ABDOMEN PELVIS W CONTRAST 1. No acute abnormality in the abdomen or pelvis. 2. Interval cholecystectomy. Trace stranding and fluid in the cholecystectomy bed is favored postoperative. 3. Unchanged intra and extrahepatic biliary dilation. No radiopaque stone. 4. 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 5. Aortic Atherosclerosis (ICD10-I70.0).   [HN]  0019 D/w Dr. Dorrie Gaudier with general surgery who will see patient in the AM. Recommends medicine admission. [HN]    Clinical Course User Index [HN] Merdis Stalling, MD    Labs: I Ordered, and personally interpreted labs.  The pertinent results include: Those listed above  Imaging Studies ordered: I ordered imaging studies including  CT abdomen/pelvis with contrast I independently visualized and interpreted imaging. I agree with the radiologist interpretation  Additional history obtained from chart review, wife at bedside.    Cardiac Monitoring: The patient was maintained on a cardiac monitor.  I personally viewed and interpreted the cardiac monitored which showed an underlying rhythm of: Normal sinus rhythm  Reevaluation: After the interventions noted above, I reevaluated the patient and found that they have :stayed the same  Social Determinants of Health:  lives independently  Disposition:  Admit to medicine w/ surgery following  Co morbidities that complicate the patient evaluation  Past Medical History:  Diagnosis Date   Hepatitis C    Hypertension      Medicines Meds ordered this encounter  Medications   iohexol  (OMNIPAQUE ) 300 MG/ML solution 100 mL   ondansetron  (ZOFRAN ) injection 4 mg    I have reviewed the patients home medicines and have made adjustments as needed  Problem List / ED Course: Problem List Items Addressed This Visit   None Visit Diagnoses       Nausea and vomiting, unspecified vomiting type    -  Primary     Transaminitis         Hyperbilirubinemia                {Document critical care time when appropriate:1} {Document review of labs and clinical decision tools ie heart score, Chads2Vasc2 etc:1}  {Document your independent review of radiology images, and any outside records:1} {Document your discussion with family members, caretakers, and with consultants:1} {Document social determinants of health affecting pt's care:1} {Document your decision making why or why not admission, treatments were needed:1}  This note was created using dictation software, which may contain spelling or grammatical errors.

## 2023-11-01 ENCOUNTER — Encounter (HOSPITAL_COMMUNITY): Payer: Self-pay | Admitting: Family Medicine

## 2023-11-01 ENCOUNTER — Inpatient Hospital Stay (HOSPITAL_COMMUNITY)

## 2023-11-01 ENCOUNTER — Inpatient Hospital Stay (HOSPITAL_COMMUNITY): Admitting: Certified Registered Nurse Anesthetist

## 2023-11-01 ENCOUNTER — Encounter (HOSPITAL_COMMUNITY)
Admission: EM | Disposition: A | Discharge status: Home / Self Care | Payer: Self-pay | Source: Home / Self Care | Attending: Family Medicine

## 2023-11-01 DIAGNOSIS — K831 Obstruction of bile duct: Secondary | ICD-10-CM | POA: Diagnosis present

## 2023-11-01 DIAGNOSIS — Z79899 Other long term (current) drug therapy: Secondary | ICD-10-CM | POA: Diagnosis not present

## 2023-11-01 DIAGNOSIS — K4090 Unilateral inguinal hernia, without obstruction or gangrene, not specified as recurrent: Secondary | ICD-10-CM | POA: Diagnosis present

## 2023-11-01 DIAGNOSIS — Z8619 Personal history of other infectious and parasitic diseases: Secondary | ICD-10-CM | POA: Diagnosis not present

## 2023-11-01 DIAGNOSIS — R112 Nausea with vomiting, unspecified: Secondary | ICD-10-CM | POA: Diagnosis present

## 2023-11-01 DIAGNOSIS — N3289 Other specified disorders of bladder: Secondary | ICD-10-CM | POA: Diagnosis present

## 2023-11-01 DIAGNOSIS — Z7982 Long term (current) use of aspirin: Secondary | ICD-10-CM | POA: Diagnosis not present

## 2023-11-01 DIAGNOSIS — I700 Atherosclerosis of aorta: Secondary | ICD-10-CM | POA: Diagnosis present

## 2023-11-01 DIAGNOSIS — E876 Hypokalemia: Secondary | ICD-10-CM | POA: Diagnosis present

## 2023-11-01 DIAGNOSIS — Z9049 Acquired absence of other specified parts of digestive tract: Secondary | ICD-10-CM | POA: Diagnosis not present

## 2023-11-01 DIAGNOSIS — K8309 Other cholangitis: Secondary | ICD-10-CM | POA: Diagnosis present

## 2023-11-01 DIAGNOSIS — Z87891 Personal history of nicotine dependence: Secondary | ICD-10-CM | POA: Diagnosis not present

## 2023-11-01 DIAGNOSIS — Z885 Allergy status to narcotic agent status: Secondary | ICD-10-CM | POA: Diagnosis not present

## 2023-11-01 DIAGNOSIS — I10 Essential (primary) hypertension: Secondary | ICD-10-CM | POA: Diagnosis present

## 2023-11-01 DIAGNOSIS — Z9889 Other specified postprocedural states: Secondary | ICD-10-CM | POA: Diagnosis not present

## 2023-11-01 DIAGNOSIS — D649 Anemia, unspecified: Secondary | ICD-10-CM | POA: Diagnosis present

## 2023-11-01 DIAGNOSIS — K7460 Unspecified cirrhosis of liver: Secondary | ICD-10-CM | POA: Diagnosis present

## 2023-11-01 HISTORY — PX: ERCP: SHX5425

## 2023-11-01 LAB — CBC
HCT: 35.3 % — ABNORMAL LOW (ref 39.0–52.0)
Hemoglobin: 11 g/dL — ABNORMAL LOW (ref 13.0–17.0)
MCH: 27.6 pg (ref 26.0–34.0)
MCHC: 31.2 g/dL (ref 30.0–36.0)
MCV: 88.7 fL (ref 80.0–100.0)
Platelets: 227 10*3/uL (ref 150–400)
RBC: 3.98 MIL/uL — ABNORMAL LOW (ref 4.22–5.81)
RDW: 14.2 % (ref 11.5–15.5)
WBC: 8.5 10*3/uL (ref 4.0–10.5)
nRBC: 0 % (ref 0.0–0.2)

## 2023-11-01 LAB — COMPREHENSIVE METABOLIC PANEL WITH GFR
ALT: 147 U/L — ABNORMAL HIGH (ref 0–44)
AST: 135 U/L — ABNORMAL HIGH (ref 15–41)
Albumin: 2.7 g/dL — ABNORMAL LOW (ref 3.5–5.0)
Alkaline Phosphatase: 240 U/L — ABNORMAL HIGH (ref 38–126)
Anion gap: 11 (ref 5–15)
BUN: 19 mg/dL (ref 8–23)
CO2: 23 mmol/L (ref 22–32)
Calcium: 8.4 mg/dL — ABNORMAL LOW (ref 8.9–10.3)
Chloride: 101 mmol/L (ref 98–111)
Creatinine, Ser: 0.67 mg/dL (ref 0.61–1.24)
GFR, Estimated: >60 mL/min (ref >60)
Glucose, Bld: 71 mg/dL (ref 70–99)
Potassium: 3.2 mmol/L — ABNORMAL LOW (ref 3.5–5.1)
Sodium: 135 mmol/L (ref 135–145)
Total Bilirubin: 10 mg/dL — ABNORMAL HIGH (ref 0.0–1.2)
Total Protein: 6.7 g/dL (ref 6.5–8.1)

## 2023-11-01 LAB — MAGNESIUM: Magnesium: 2.2 mg/dL (ref 1.7–2.4)

## 2023-11-01 LAB — PHOSPHORUS: Phosphorus: 3.3 mg/dL (ref 2.5–4.6)

## 2023-11-01 LAB — BILIRUBIN, DIRECT: Bilirubin, Direct: 5.7 mg/dL — ABNORMAL HIGH (ref 0.0–0.2)

## 2023-11-01 LAB — PROTIME-INR
INR: 1.1 (ref 0.8–1.2)
Prothrombin Time: 14.3 s (ref 11.4–15.2)

## 2023-11-01 SURGERY — ERCP, WITH INTERVENTION IF INDICATED
Anesthesia: General

## 2023-11-01 MED ORDER — GLUCAGON HCL RDNA (DIAGNOSTIC) 1 MG IJ SOLR
INTRAMUSCULAR | Status: AC
  Filled 2023-11-01: qty 2

## 2023-11-01 MED ORDER — CIPROFLOXACIN IN D5W 400 MG/200ML IV SOLN
INTRAVENOUS | Status: AC
  Filled 2023-11-01: qty 200

## 2023-11-01 MED ORDER — CIPROFLOXACIN IN D5W 400 MG/200ML IV SOLN
INTRAVENOUS | Status: DC | PRN
  Administered 2023-11-01: 400 mg via INTRAVENOUS

## 2023-11-01 MED ORDER — DICLOFENAC SUPPOSITORY 100 MG
RECTAL | Status: DC | PRN
  Administered 2023-11-01: 100 mg via RECTAL

## 2023-11-01 MED ORDER — DICLOFENAC SUPPOSITORY 100 MG
RECTAL | Status: AC
  Filled 2023-11-01: qty 1

## 2023-11-01 MED ORDER — ROCURONIUM BROMIDE 10 MG/ML (PF) SYRINGE
PREFILLED_SYRINGE | INTRAVENOUS | Status: DC | PRN
  Administered 2023-11-01: 40 mg via INTRAVENOUS

## 2023-11-01 MED ORDER — DORZOLAMIDE HCL-TIMOLOL MAL 2-0.5 % OP SOLN
1.0000 [drp] | Freq: Two times a day (BID) | OPHTHALMIC | Status: DC
Start: 2023-11-01 — End: 2023-11-02
  Administered 2023-11-01 – 2023-11-02 (×2): 1 [drp] via OPHTHALMIC
  Filled 2023-11-01: qty 10

## 2023-11-01 MED ORDER — ACETAMINOPHEN 10 MG/ML IV SOLN: 1000.0000 mg | Freq: Once | INTRAVENOUS | Status: DC | PRN

## 2023-11-01 MED ORDER — SODIUM CHLORIDE 0.9 % IV SOLN
INTRAVENOUS | Status: DC | PRN
  Administered 2023-11-01: 25 mL

## 2023-11-01 MED ORDER — SODIUM CHLORIDE 0.9 % IV SOLN
INTRAVENOUS | Status: AC | PRN
  Administered 2023-11-01: 500 mL via INTRAMUSCULAR

## 2023-11-01 MED ORDER — PROPOFOL 10 MG/ML IV BOLUS
INTRAVENOUS | Status: DC | PRN
  Administered 2023-11-01: 120 mg via INTRAVENOUS

## 2023-11-01 MED ORDER — FENTANYL CITRATE (PF) 100 MCG/2ML IJ SOLN: 25.0000 ug | INTRAMUSCULAR | Status: DC | PRN

## 2023-11-01 MED ORDER — ONDANSETRON HCL 4 MG/2ML IJ SOLN: 4.0000 mg | Freq: Once | INTRAMUSCULAR | Status: DC | PRN

## 2023-11-01 MED ORDER — FENTANYL CITRATE (PF) 100 MCG/2ML IJ SOLN
INTRAMUSCULAR | Status: AC
Start: 2023-11-01 — End: ?
  Filled 2023-11-01: qty 2

## 2023-11-01 MED ORDER — POTASSIUM CHLORIDE CRYS ER 20 MEQ PO TBCR
40.0000 meq | EXTENDED_RELEASE_TABLET | Freq: Two times a day (BID) | ORAL | Status: AC
Start: 2023-11-01 — End: 2023-11-01
  Administered 2023-11-01 (×2): 40 meq via ORAL
  Filled 2023-11-01 (×2): qty 2

## 2023-11-01 MED ORDER — ACETAMINOPHEN 650 MG RE SUPP: 650.0000 mg | Freq: Four times a day (QID) | RECTAL | Status: DC | PRN

## 2023-11-01 MED ORDER — PHENYLEPHRINE 80 MCG/ML (10ML) SYRINGE FOR IV PUSH (FOR BLOOD PRESSURE SUPPORT)
PREFILLED_SYRINGE | INTRAVENOUS | Status: DC | PRN
  Administered 2023-11-01: 80 ug via INTRAVENOUS

## 2023-11-01 MED ORDER — ACETAMINOPHEN 325 MG PO TABS: 650.0000 mg | ORAL_TABLET | Freq: Four times a day (QID) | ORAL | Status: DC | PRN

## 2023-11-01 MED ORDER — SUGAMMADEX SODIUM 200 MG/2ML IV SOLN
INTRAVENOUS | Status: DC | PRN
Start: 2023-11-01 — End: 2023-11-01
  Administered 2023-11-01: 200 mg via INTRAVENOUS

## 2023-11-01 MED ORDER — OMEPRAZOLE 20 MG PO CPDR
20.0000 mg | DELAYED_RELEASE_CAPSULE | Freq: Every day | ORAL | Status: DC
  Administered 2023-11-01 – 2023-11-02 (×2): 20 mg via ORAL
  Filled 2023-11-01 (×2): qty 1

## 2023-11-01 MED ORDER — ONDANSETRON HCL 4 MG PO TABS
4.0000 mg | ORAL_TABLET | Freq: Four times a day (QID) | ORAL | Status: DC | PRN
Start: 2023-11-01 — End: 2023-11-02

## 2023-11-01 MED ORDER — ONDANSETRON HCL 4 MG/2ML IJ SOLN: 4.0000 mg | Freq: Four times a day (QID) | INTRAMUSCULAR | Status: DC | PRN

## 2023-11-01 MED ORDER — ONDANSETRON HCL 4 MG/2ML IJ SOLN
INTRAMUSCULAR | Status: DC | PRN
  Administered 2023-11-01: 4 mg via INTRAVENOUS

## 2023-11-01 MED ORDER — ONDANSETRON HCL 4 MG/2ML IJ SOLN
4.0000 mg | Freq: Once | INTRAMUSCULAR | Status: AC
  Administered 2023-11-01: 4 mg via INTRAVENOUS
  Filled 2023-11-01: qty 2

## 2023-11-01 MED ORDER — LATANOPROST 0.005 % OP SOLN
1.0000 [drp] | Freq: Every day | OPHTHALMIC | Status: DC
  Administered 2023-11-01: 1 [drp] via OPHTHALMIC
  Filled 2023-11-01: qty 2.5

## 2023-11-01 MED ORDER — LIDOCAINE 2% (20 MG/ML) 5 ML SYRINGE
INTRAMUSCULAR | Status: DC | PRN
  Administered 2023-11-01: 40 mg via INTRAVENOUS

## 2023-11-01 MED ORDER — CIPROFLOXACIN IN D5W 400 MG/200ML IV SOLN
400.0000 mg | Freq: Two times a day (BID) | INTRAVENOUS | Status: DC
  Administered 2023-11-02: 400 mg via INTRAVENOUS
  Filled 2023-11-01: qty 200

## 2023-11-01 MED ORDER — DEXAMETHASONE SODIUM PHOSPHATE 10 MG/ML IJ SOLN
INTRAMUSCULAR | Status: DC | PRN
  Administered 2023-11-01: 5 mg via INTRAVENOUS

## 2023-11-01 MED ORDER — POLYETHYLENE GLYCOL 3350 17 G PO PACK: 17.0000 g | PACK | Freq: Every day | ORAL | Status: DC | PRN

## 2023-11-01 MED ORDER — LACTATED RINGERS IV SOLN: INTRAVENOUS | Status: AC

## 2023-11-01 MED ORDER — FENTANYL CITRATE (PF) 100 MCG/2ML IJ SOLN
INTRAMUSCULAR | Status: DC | PRN
  Administered 2023-11-01 (×2): 50 ug via INTRAVENOUS

## 2023-11-01 MED ORDER — MORPHINE SULFATE (PF) 2 MG/ML IV SOLN: 2.0000 mg | INTRAVENOUS | Status: DC | PRN

## 2023-11-01 MED ORDER — PROCHLORPERAZINE EDISYLATE 10 MG/2ML IJ SOLN: 5.0000 mg | Freq: Four times a day (QID) | INTRAMUSCULAR | Status: DC | PRN

## 2023-11-01 NOTE — Transfer of Care (Signed)
 Immediate Anesthesia Transfer of Care Note  Patient: Andrew Douglas  Procedure(s) Performed: Procedure(s): ERCP, WITH INTERVENTION IF INDICATED (N/A)  Patient Location: PACU  Anesthesia Type:General  Level of Consciousness: Alert, Awake, Oriented  Airway & Oxygen Therapy: Patient Spontanous Breathing  Post-op Assessment: Report given to RN  Post vital signs: Reviewed and stable  Last Vitals:  Vitals:   11/01/23 0902 11/01/23 1208  BP: 122/79 (!) 140/87  Pulse: 81 82  Resp: 16 15  Temp: 36.7 C 36.6 C  SpO2: 96% 96%    Complications: No apparent anesthesia complications

## 2023-11-01 NOTE — Progress Notes (Signed)
 Patient familiar to me from last hospital stay and surgical team called me last week and I ran into his wife and after our discussion and with an opening in the endoscopy unit today we will proceed directly to ERCP and okay to hold MRCP and this was relayed to the hospital team as well and the wife will talk to the patient and I will discuss it with him later today before our procedure and his CT and labs were reviewed

## 2023-11-01 NOTE — Progress Notes (Signed)
 Progress Note     Subjective: Pt reports he started vomiting and having dark urine again around 4/17 after going to dinner. He denies significant abdominal pain but does have some discomfort around the right with deep inspiration. His wife is at bedside and reports they were told to come to the ED after follow up LFTs were elevated.   Objective: Vital signs in last 24 hours: Temp:  [97.5 F (36.4 C)-98.4 F (36.9 C)] 97.9 F (36.6 C) (04/22 0806) Pulse Rate:  [66-86] 84 (04/22 0806) Resp:  [15-19] 16 (04/22 0806) BP: (112-159)/(71-87) 133/83 (04/22 0806) SpO2:  [95 %-98 %] 97 % (04/22 0806)    Intake/Output from previous day: No intake/output data recorded. Intake/Output this shift: No intake/output data recorded.  PE: General: pleasant, WD, WN male who is laying in bed in NAD HEENT: sclera mildly icteric Heart: regular, rate, and rhythm.   Lungs:  Respiratory effort nonlabored Abd: soft, NT, ND, incisions well healing  Skin: warm and dry with no masses, lesions, or rashes Psych: A&Ox3 with an appropriate affect.    Lab Results:  Recent Labs    10/31/23 1304 11/01/23 0511  WBC 8.8 8.5  HGB 11.8* 11.0*  HCT 37.5* 35.3*  PLT 261 227   BMET Recent Labs    10/31/23 1304 11/01/23 0511  NA 138 135  K 3.0* 3.2*  CL 104 101  CO2 25 23  GLUCOSE 115* 71  BUN 16 19  CREATININE 0.82 0.67  CALCIUM 8.7* 8.4*   PT/INR Recent Labs    11/01/23 0628  LABPROT 14.3  INR 1.1   CMP     Component Value Date/Time   NA 135 11/01/2023 0511   K 3.2 (L) 11/01/2023 0511   CL 101 11/01/2023 0511   CO2 23 11/01/2023 0511   GLUCOSE 71 11/01/2023 0511   BUN 19 11/01/2023 0511   CREATININE 0.67 11/01/2023 0511   CALCIUM 8.4 (L) 11/01/2023 0511   PROT 6.7 11/01/2023 0511   ALBUMIN 2.7 (L) 11/01/2023 0511   AST 135 (H) 11/01/2023 0511   ALT 147 (H) 11/01/2023 0511   ALKPHOS 240 (H) 11/01/2023 0511   BILITOT 10.0 (H) 11/01/2023 0511   GFRNONAA >60 11/01/2023 0511    Lipase     Component Value Date/Time   LIPASE 25 10/31/2023 1304       Studies/Results: CT ABDOMEN PELVIS W CONTRAST Result Date: 11/01/2023 CLINICAL DATA:  Postop abdominal pain. Gallbladder removed 2 weeks ago. Since surgery he has been vomiting. Urine is dark in color. Elevated liver enzymes. EXAM: CT ABDOMEN AND PELVIS WITH CONTRAST TECHNIQUE: Multidetector CT imaging of the abdomen and pelvis was performed using the standard protocol following bolus administration of intravenous contrast. RADIATION DOSE REDUCTION: This exam was performed according to the departmental dose-optimization program which includes automated exposure control, adjustment of the mA and/or kV according to patient size and/or use of iterative reconstruction technique. CONTRAST:  OMNIPAQUE  IOHEXOL  300 MG/ML  SOLN COMPARISON:  MRI abdomen 10/01/2023 and CT abdomen pelvis 09/30/2023 FINDINGS: Lower chest: No acute abnormality. Hepatobiliary: Cholecystectomy. Trace stranding and fluid in the cholecystectomy bed is favored postoperative. Unchanged intra and extrahepatic biliary dilation. No radiopaque stone. The common bile duct again measures 1.1 cm. Scattered hepatic cysts. Pancreas: No pancreatic ductal dilation. No peripancreatic fat stranding. Spleen: Unremarkable. Adrenals/Urinary Tract: Unremarkable adrenal glands. No urinary calculi or hydronephrosis. 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 Stomach/Bowel: Normal caliber large and small bowel. No  bowel wall thickening. Colonic diverticulosis without diverticulitis. Normal appendix. Stomach is within normal limits. Duodenal diverticulum. Vascular/Lymphatic: Aortic atherosclerosis. No enlarged abdominal or pelvic lymph nodes. Reproductive: Unremarkable. Other: No free intraperitoneal fluid or air. Bilateral fat containing inguinal hernias greater on the left. The anterior left bladder partially herniates into the left inguinal  hernia. Musculoskeletal: No acute fracture. IMPRESSION: 1. No acute abnormality in the abdomen or pelvis. 2. Interval cholecystectomy. Trace stranding and fluid in the cholecystectomy bed is favored postoperative. 3. Unchanged intra and extrahepatic biliary dilation. No radiopaque stone. 4. 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 5. Aortic Atherosclerosis (ICD10-I70.0). Electronically Signed   By: Rozell Cornet M.D.   On: 11/01/2023 00:05    Anti-infectives: Anti-infectives (From admission, onward)    None        Assessment/Plan S/P laparoscopic cholecystectomy with [facility] 3/24 Dr. Afton Horse Hyperbilirubinemia with intra and extrahepatic biliary dilation suggestive of obstruction  - CT today with trace stranding and fluid in RUQ which is favored to be post-operative - no significant RUQ abdominal pain . No leukocytosis and hgb is stable - pt underwent ERCP pre-op 3/23 which was aborted for mucosal tear of the stomach - pt underwent ERCP post-op 3/25 given IOC suggestive of obstruction distally - no stones or debris noted and biliary sphincterotomy was performed by Dr. Lavaughn Portland  - now with Tbili of 10, Alk Phos 240, AST/ALT 135/147 - recommend MRCP to better evaluate biliary ductal system and recommend GI consult   FEN: NPO, IVF per [facility] VTE: SCDs ID: none currently   LOS: 0 days     Annetta Killian, [facility] Surgery 11/01/2023, 8:20 AM Please see Amion for pager number during day hours 7:00am-4:30pm

## 2023-11-01 NOTE — Op Note (Signed)
 [facility] Patient Name: Andrew Douglas Procedure Date: 11/01/2023 MRN: [id number] Attending MD: Ozell Blunt , MD, [id number] Date of Birth: [birth year] CSN: [id number] Age: 78 Admit Type: Inpatient Procedure:                ERCP Indications:              Biliary dilation on Computed Tomogram Scan,                            Jaundice, Elevated liver enzymes Providers:                Ozell Blunt, MD, Bradley Caffey, Tyrus Gallus,                            Technician Referring MD:              Medicines:                General Anesthesia Complications:            No immediate complications. Estimated Blood Loss:     Estimated blood loss: none. Procedure:                Pre-Anesthesia Assessment:                           - Prior to the procedure, a History and Physical                            was performed, and patient medications and                            allergies were reviewed. The patient's tolerance of                            previous anesthesia was also reviewed. The risks                            and benefits of the procedure and the sedation                            options and risks were discussed with the patient.                            All questions were answered, and informed consent                            was obtained. Prior Anticoagulants: The patient has                            taken no anticoagulant or antiplatelet agents. ASA                            Grade Assessment: II - A patient with mild systemic                            disease. After  reviewing the risks and benefits,                            the patient was deemed in satisfactory condition to                            undergo the procedure.                           After obtaining informed consent, the scope was                            passed under direct vision. Throughout the                            procedure, the patient's blood pressure, pulse,  and                            oxygen saturations were monitored continuously. The                            [id number]-[id number] ([id number] Olympus duodenoscope was                            introduced through the mouth, and used to inject                            contrast into and used to cannulate the bile duct.                            The ERCP was accomplished without difficulty. The                            patient tolerated the procedure well. We did the                            procedure with him on his side based on                            difficulties in the past passing the scope with him                            on his abdomen Scope In: Scope Out: Findings:      The major papilla was some distance away from a diverticulum. The major       papilla was slightly edematous. A previous needle-knife and conventional       extension biliary sphincterotomy had been performed. The sphincterotomy       appeared stenosed or narrowed. Deep selective cannulation through that       site was readily obtained and there was no pancreatic duct injection or       wire advancement throughout the procedure and pus came from the opening       as soon as we cannulated and the biliary sphincterotomy was extended  with a Hydratome sphincterotome using ERBE electrocautery. There was no       post-sphincterotomy bleeding. We extended up to the fold as far as we       felt safe to go and could get the fully bowed sphincterotome easily in       and out of the duct but there was some sluggish drainage and to discover       objects, the biliary tree was swept with an adjustable 9- 12 mm balloon       starting at the bifurcation. Sludge and pus was swept from the duct but       no stones were delivered multiple balloon pull-through's using both       sizes of balloons and both passed fairly readily through the patent       sphincterotomy. However pus continue to flow from the duct and there was        sluggish drainage on occlusion cholangiogram which did not reveal any       residual abnormalities and we elected to place one 10 Fr by 4 cm       temporary covered metal biliary stent with no external flaps and no       internal flaps was placed 2 cm into the common bile duct. Pus flowed       through the stent. The stent was in good position. Also on occlusion       cholangiogram there appeared to be a short distal stricture so we placed       the metal stent as above to help dilate the stricture and allow the duct       to drain and the wire and introducer were removed and the scope was       removed and the patient tolerated the procedure well Impression:               - The major papilla was some distance away from a                            diverticulum.                           - The major papilla appeared slightly edematous.                           - Prior biliary sphincterotomy appeared stenosed or                            narrowed.                           - A biliary sphincterotomy was performed.                           - The biliary tree was swept and sludge and pus was                            found.                           - One temporary covered metal biliary stent was  placed into the common bile duct. Moderate Sedation:      Not Applicable - Patient had care per Anesthesia. Recommendation:           - Clear liquid diet today. If doing well in a.m.                            may have soft solids                           - Continue present medications. Would discharge on                            5 days of Cipro  total                           - Return to GI clinic in 2 weeks. To recheck                            symptoms labs and probably set up stent removal in                            6 to 8 weeks                           - Telephone GI clinic if symptomatic PRN.                           - Check liver enzymes (AST, ALT,  alkaline                            phosphatase, bilirubin) in the morning. And follow                            back to normal as an outpatient Procedure Code(s):        --- Professional ---                           [phone], Endoscopic retrograde                            cholangiopancreatography (ERCP); with placement of                            endoscopic stent into biliary or pancreatic duct,                            including pre- and post-dilation and guide wire                            passage, when performed, including sphincterotomy,                            when performed, each stent  [id number], Endoscopic retrograde                            cholangiopancreatography (ERCP); with removal of                            calculi/debris from biliary/pancreatic duct(s) Diagnosis Code(s):        --- Professional ---                           K83.8, Other specified diseases of biliary tract                           R17, Unspecified jaundice                           R74.8, Abnormal levels of other serum enzymes CPT copyright 2022 [employer]. All rights reserved. The codes documented in this report are preliminary and upon coder review may  be revised to meet current compliance requirements. Ozell Blunt, MD 11/01/2023 2:23:53 PM This report has been signed electronically. Number of Addenda: 0

## 2023-11-01 NOTE — Plan of Care (Signed)

## 2023-11-01 NOTE — Anesthesia Procedure Notes (Addendum)
 Procedure Name: Intubation Date/Time: 11/01/2023 1:14 PM  Performed by: Bernhard Brine, CRNAPre-anesthesia Checklist: Patient identified, Patient being monitored, Timeout performed, Emergency Drugs available and Suction available Patient Re-evaluated:Patient Re-evaluated prior to induction Oxygen Delivery Method: Circle system utilized Preoxygenation: Pre-oxygenation with 100% oxygen Induction Type: IV induction Ventilation: Mask ventilation without difficulty Laryngoscope Size: Mac and 3 Grade View: Grade I Tube type: Oral Tube size: 7.0 mm Number of attempts: 1 Airway Equipment and Method: Stylet Placement Confirmation: ETT inserted through vocal cords under direct vision, positive ETCO2 and breath sounds checked- equal and bilateral Secured at: 24 cm Tube secured with: Tape Dental Injury: Teeth and Oropharynx as per pre-operative assessment

## 2023-11-01 NOTE — Anesthesia Preprocedure Evaluation (Addendum)
 Anesthesia Evaluation  Patient identified by MRN, date of birth, ID band Patient awake    Reviewed: Allergy & Precautions, NPO status , Patient's Chart, lab work & pertinent test results  History of Anesthesia Complications Negative for: history of anesthetic complications  Airway Mallampati: II  TM Distance: >3 FB Neck ROM: Full    Dental no notable dental hx. (+) Dental Advisory Given, Partial Upper   Pulmonary former smoker   Pulmonary exam normal breath sounds clear to auscultation       Cardiovascular hypertension, Pt. on medications (-) angina (-) Past MI Normal cardiovascular exam Rhythm:Regular Rate:Normal     Neuro/Psych negative neurological ROS  negative psych ROS   GI/Hepatic negative GI ROS,,,(+) Hepatitis -, C  Endo/Other  negative endocrine ROS    Renal/GU negative Renal ROSLab Results      Component                Value               Date                      NA                       135                 11/01/2023                CL                       101                 11/01/2023                K                        3.2 (L)             11/01/2023                CO2                      23                  11/01/2023                BUN                      19                  11/01/2023                CREATININE               0.67                11/01/2023                GFRNONAA                 >60                 11/01/2023                CALCIUM                  8.4 (L)  11/01/2023                PHOS                     3.3                 11/01/2023                ALBUMIN                  2.7 (L)             11/01/2023                GLUCOSE                  71                  11/01/2023                Musculoskeletal negative musculoskeletal ROS (+)    Abdominal   Peds  Hematology  (+) Blood dyscrasia, anemia Lab Results      Component                Value                Date                      WBC                      8.5                 11/01/2023                HGB                      11.0 (L)            11/01/2023                HCT                      35.3 (L)            11/01/2023                MCV                      88.7                11/01/2023                PLT                      227                 11/01/2023              Anesthesia Other Findings   Reproductive/Obstetrics                             Anesthesia Physical Anesthesia Plan  ASA: 2  Anesthesia Plan: General   Post-op Pain Management: Minimal or no pain anticipated   Induction: Intravenous  PONV Risk Score and Plan: 2 and Treatment may vary due to age or medical condition, Ondansetron  and Propofol  infusion  Airway Management Planned: Oral ETT  Additional Equipment: None  Intra-op Plan:   Post-operative Plan: Extubation in OR  Informed Consent: I have reviewed the patients History and Physical, chart, labs and discussed the procedure including the risks, benefits and alternatives for the proposed anesthesia with the patient or authorized representative who has indicated his/her understanding and acceptance.     Dental advisory given  Plan Discussed with: CRNA and Anesthesiologist  Anesthesia Plan Comments:         Anesthesia Quick Evaluation

## 2023-11-01 NOTE — H&P (Signed)
 History and Physical    Devesh Monforte ZOX:[id number] DOB: [birth year] DOA: 10/31/2023  PCP: [name], MD (Inactive)  Patient coming from: home  I have personally briefly reviewed patient's old medical records in [facility]  Chief Complaint: n/v, abnormal labs  HPI: Andrew Douglas is Andrew Douglas 78 y.o. male with medical history significant of hepatitis C s/p treatment, hypertension, and recent cholecystectomy 10/03/2023 (for cholecystitis) here for N/V and abnormal labs.  He was directed here by his outpatient providers.  He notes feeling relatively well after his cholecystectomy.  He was discharged on 3/24.  On Saturday, 3/29, he went to Renise Gillies birthday dinner.  He ate Arturo Freundlich decent amount.  When he got home he had nausea and vomiting.  Since then, he's had poor appetite, early satiety, N/V and generalized weakness.  He's noticed darker urine since around that time as well.  His stool is light in color.  Notes subjective fevers this past weekend (no measured temp).  No CP, no SOB, no abdominal pain.  His symptoms of N/V got worse this past weekend and he was directed to the hospital due to abnormal outpatient labs.    Denies smoking or alcohol.     ED Course: Labs, imaging.  Admit (carryover) for abnormal LFT's, surgery and GI consults.   Review of Systems: As per HPI otherwise all other systems reviewed and are negative.  Past Medical History:  Diagnosis Date   Hepatitis C    Hypertension     Past Surgical History:  Procedure Laterality Date   BACK SURGERY     "Andreah Goheen little back problem 20 or 25 years ago"   BILIARY DILATION  10/04/2023   Procedure: DILATION, STRICTURE, BILE DUCT;  Surgeon: Ozell Blunt, MD;  Location: [facility];  Service: Gastroenterology;;   CHOLECYSTECTOMY N/Saurav Crumble 10/03/2023   Procedure: LAPAROSCOPIC CHOLECYSTECTOMY WITH ICG DYE AND  INTRAOPERATIVE CHOLANGIOGRAM;  Surgeon: Sim Dryer, MD;  Location: [facility];  Service: General;  Laterality: N/Keijuan Schellhase;  ICG   ERCP N/Keita Valley 10/04/2023    Procedure: ERCP, WITH INTERVENTION IF INDICATED;  Surgeon: Ozell Blunt, MD;  [facility]: [facility];  Service: Gastroenterology;  Laterality: N/Bijan Ridgley;   ESOPHAGOGASTRODUODENOSCOPY N/Annistyn Depass 10/02/2023   Procedure: EGD (ESOPHAGOGASTRODUODENOSCOPY);  Surgeon: Kenney Peacemaker, MD;  Location: [facility];  Service: Gastroenterology;  Laterality: N/Inez Rosato;   SPHINCTEROTOMY  10/04/2023   Procedure: SPHINCTEROTOMY, BILIARY;  Surgeon: Ozell Blunt, MD;  Location: [facility];  Service: Gastroenterology;;    Social History  reports that he has quit smoking. His smoking use included cigarettes. He has been exposed to tobacco smoke. He has never used smokeless tobacco. He reports that he does not currently use alcohol. He reports that he does not currently use drugs.  Allergies  Allergen Reactions   Oxycontin  [Oxycodone ]     "Crazy dreams"    History reviewed. No pertinent family history.  Prior to Admission medications   Medication Sig Start Date End Date Taking? Authorizing Provider  ascorbic acid (VITAMIN C) 500 MG tablet Take 500 mg by mouth daily.   Yes [provider]  aspirin EC 81 MG tablet Take 81 mg by mouth daily. Swallow whole.   Yes [provider]  benzonatate  (TESSALON ) 200 MG capsule Take 200 mg by mouth 3 (three) times daily as needed for cough.   Yes [provider]  dorzolamide -timolol  (COSOPT ) 2-0.5 % ophthalmic solution Place 1 drop into both eyes 2 (two) times daily.   Yes [provider]  losartan-hydrochlorothiazide (HYZAAR) 100-12.5 MG tablet Take 1  tablet by mouth daily.   Yes [provider]  Multiple Vitamin (MULTIVITAMIN WITH MINERALS) TABS tablet Take 1 tablet by mouth daily.   Yes [provider]  omeprazole  (PRILOSEC OTC) 20 MG tablet Take 20 mg by mouth daily. 09/30/23  Yes [provider]  Red Yeast Rice Extract (RED YEAST RICE PO) Take 1 capsule by mouth daily at 12 noon.   Yes [provider]  Travoprost,  BAK Free, (TRAVATAN) 0.004 % SOLN ophthalmic solution Place 1 drop into both eyes at bedtime. 09/10/20  Yes [provider]  pantoprazole  (PROTONIX ) 20 MG tablet Take 1 tablet (20 mg total) by mouth daily. Patient not taking: Reported on 09/30/2023 06/16/22   Mannam, Praveen, MD    Physical Exam: Vitals:   11/01/23 0705 11/01/23 0709 11/01/23 0800 11/01/23 0806  BP: 125/78  (!) 159/78 133/83  Pulse: 84  86 84  Resp: 15  19 16   Temp:  97.6 F (36.4 C)  97.9 F (36.6 C)  TempSrc:  Oral  Oral  SpO2: 95%  96% 97%    Constitutional: NAD, calm, comfortable Vitals:   11/01/23 0705 11/01/23 0709 11/01/23 0800 11/01/23 0806  BP: 125/78  (!) 159/78 133/83  Pulse: 84  86 84  Resp: 15  19 16   Temp:  97.6 F (36.4 C)  97.9 F (36.6 C)  TempSrc:  Oral  Oral  SpO2: 95%  96% 97%   Eyes: mild scleral icterus (less than I'd expect)  ENMT: Mucous membranes are moist. Neck: normal, supple Respiratory: clear to auscultation bilaterally, no wheezing, no crackles. Cardiovascular: Regular rate and rhythm, no murmurs / rubs / gallops. Abdomen: no tenderness, no masses palpated. Laparoscopic Surgical incisions well healing.  Musculoskeletal: no clubbing / cyanosis. No joint deformity upper and lower extremities. Good ROM, no contractures. Normal muscle tone.  Skin: no rashes, lesions, ulcers. No induration Neurologic: CN 2-12 grossly intact. Moving all extremities Psychiatric: Normal judgment and insight. Alert and oriented x 3. Normal mood.   Labs on Admission: I have personally reviewed following labs and imaging studies  CBC: Recent Labs  Lab 10/31/23 1304 11/01/23 0511  WBC 8.8 8.5  HGB 11.8* 11.0*  HCT 37.5* 35.3*  MCV 88.2 88.7  PLT 261 227    Basic Metabolic Panel: Recent Labs  Lab 10/31/23 1304 11/01/23 0511  NA 138 135  K 3.0* 3.2*  CL 104 101  CO2 25 23  GLUCOSE 115* 71  BUN 16 19  CREATININE 0.82 0.67  CALCIUM 8.7* 8.4*  MG  --  2.2  PHOS  --  3.3     GFR: CrCl cannot be calculated (Unknown ideal weight.).  Liver Function Tests: Recent Labs  Lab 10/31/23 1304 11/01/23 0511  AST 155* 135*  ALT 157* 147*  ALKPHOS 268* 240*  BILITOT 10.5* 10.0*  PROT 7.3 6.7  ALBUMIN 3.0* 2.7*    Urine analysis:    Component Value Date/Time   COLORURINE AMBER (Doyt Castellana) 10/31/2023 1304   APPEARANCEUR CLEAR 10/31/2023 1304   LABSPEC 1.030 10/31/2023 1304   PHURINE 5.0 10/31/2023 1304   GLUCOSEU NEGATIVE 10/31/2023 1304   HGBUR NEGATIVE 10/31/2023 1304   BILIRUBINUR MODERATE (Abdulrahim Siddiqi) 10/31/2023 1304   KETONESUR NEGATIVE 10/31/2023 1304   PROTEINUR 100 (Naziyah Tieszen) 10/31/2023 1304   NITRITE NEGATIVE 10/31/2023 1304   LEUKOCYTESUR NEGATIVE 10/31/2023 1304    Radiological Exams on Admission: CT ABDOMEN PELVIS W CONTRAST Result Date: 11/01/2023 CLINICAL DATA:  Postop abdominal pain. Gallbladder removed 2 weeks ago. Since  surgery he has been vomiting. Urine is dark in color. Elevated liver enzymes. EXAM: CT ABDOMEN AND PELVIS WITH CONTRAST TECHNIQUE: Multidetector CT imaging of the abdomen and pelvis was performed using the standard protocol following bolus administration of intravenous contrast. RADIATION DOSE REDUCTION: This exam was performed according to the departmental dose-optimization program which includes automated exposure control, adjustment of the mA and/or kV according to patient size and/or use of iterative reconstruction technique. CONTRAST:  OMNIPAQUE  IOHEXOL  300 MG/ML  SOLN COMPARISON:  MRI abdomen 10/01/2023 and CT abdomen pelvis 09/30/2023 FINDINGS: Lower chest: No acute abnormality. Hepatobiliary: Cholecystectomy. Trace stranding and fluid in the cholecystectomy bed is favored postoperative. Unchanged intra and extrahepatic biliary dilation. No radiopaque stone. The common bile duct again measures 1.1 cm. Scattered hepatic cysts. Pancreas: No pancreatic ductal dilation. No peripancreatic fat stranding. Spleen: Unremarkable. Adrenals/Urinary  Tract: Unremarkable adrenal glands. No urinary calculi or hydronephrosis. 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 Stomach/Bowel: Normal caliber large and small bowel. No bowel wall thickening. Colonic diverticulosis without diverticulitis. Normal appendix. Stomach is within normal limits. Duodenal diverticulum. Vascular/Lymphatic: Aortic atherosclerosis. No enlarged abdominal or pelvic lymph nodes. Reproductive: Unremarkable. Other: No free intraperitoneal fluid or air. Bilateral fat containing inguinal hernias greater on the left. The anterior left bladder partially herniates into the left inguinal hernia. Musculoskeletal: No acute fracture. IMPRESSION: 1. No acute abnormality in the abdomen or pelvis. 2. Interval cholecystectomy. Trace stranding and fluid in the cholecystectomy bed is favored postoperative. 3. Unchanged intra and extrahepatic biliary dilation. No radiopaque stone. 4. 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 5. Aortic Atherosclerosis (ICD10-I70.0). Electronically Signed   By: Rozell Cornet M.D.   On: 11/01/2023 00:05    EKG: Independently reviewed. None, will order  Assessment/Plan Principal Problem:   Intractable nausea and vomiting Active Problems:   Hyperbilirubinemia    Assessment and Plan:  Hyperbilirubinemia  Elevated LFT's History of recent cholecystectomy 3/24 S/p lap cholecystectomy 3/24 for cholecystitis with IOC (concern for common bile duct stone due to poor flow out of ampulla) ERCP aborted 3/23 due to mucosal tear of stomach.  S/p ERCP 3/25 with biliary sphincterotomy, dilation of CBD, sweeping of biliary tree with nothing found After discharge 3/26, felt well, but went to birthday dinner Saturday (3/29) and after had N/V - since then, describes poor appetite, N/V, generalized weakness LFT from 4/16 with bili 1.8, AST 116, ALT 112 Bili on 4/21 was 10.5, AST 155, ALT 157 CT without  acute abnormality, trace stranding and fluid in cholecystectomy bed is favored post op, intra and extrahepatic biliary dilation (no radioopaque stone) Awaiting MRCP Surgery c/s, appreciate recs GI consult, appreciate recs  Hypokalemia Replace and follow   Anemia Mild, trend  Hx Hepatitis C S/p treatment  Inguinal Hernia 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 Inpatient status is judged to be reasonable and necessary in  order to provide the required intensity of service to ensure the patient's safety. The patient's presenting symptoms, physical exam findings, and initial radiographic and laboratory data in the context of their chronic comorbidities is felt to place them at high risk for further clinical deterioration. Furthermore, it is not anticipated that the patient will be medically stable for discharge from the hospital within 2 midnights of admission.   * I certify that at the point of admission it is my clinical judgment that the patient will require inpatient hospital care spanning beyond 2 midnights from the point of admission due to high intensity of service, high risk for further deterioration and high frequency of surveillance required.Donnetta Gains MD [facility]  How to contact the [facility] Attending or Consulting provider 7A - 7P or covering provider during after hours 7P -7A, for this  patient?   Check the care team in [facility] and look for Khamila Bassinger) attending/consulting [facility] provider listed and b) the [facility] team listed Log into [email or web address] and use [place]'s universal password to access. If you do not have the password, please contact the hospital operator. Locate the [facility] provider you are looking for under [facility] and page to Amaan Meyer number that you can be directly reached. If you still have difficulty reaching the provider, please page the [facility] (Director on Call) for the Hospitalists listed on amion for assistance.  11/01/2023, 8:40 AM

## 2023-11-01 NOTE — Consult Note (Signed)
 Reason for Consult: Obstructive jaundice Referring Physician: Hospital team  Andrew Douglas is an 78 y.o. male.  HPI: Patient known to me from recent ERCP who had done well for a while and his liver tests have dropped but lately he has been having occasional nausea and vomiting and increasing liver tests and the surgeons had called me last week and we were waiting on an outpatient MRCP and he has not had any pain until today when he started having some right-sided discomfort and he has not had any fever but has noticed his yellow jaundice and has no other complaints no problems from his previous procedure  Past Medical History:  Diagnosis Date   Hepatitis C    Hypertension     Past Surgical History:  Procedure Laterality Date   BACK SURGERY     "a little back problem 20 or 25 years ago"   BILIARY DILATION  10/04/2023   Procedure: DILATION, STRICTURE, BILE DUCT;  Surgeon: Ozell Blunt, MD;  Location: [facility];  Service: Gastroenterology;;   CHOLECYSTECTOMY N/A 10/03/2023   Procedure: LAPAROSCOPIC CHOLECYSTECTOMY WITH ICG DYE AND  INTRAOPERATIVE CHOLANGIOGRAM;  Surgeon: Sim Dryer, MD;  [facility];  Service: General;  Laterality: N/A;  ICG   ERCP N/A 10/04/2023   Procedure: ERCP, WITH INTERVENTION IF INDICATED;  Surgeon: Ozell Blunt, MD;  [facility];  Service: Gastroenterology;  Laterality: N/A;   ESOPHAGOGASTRODUODENOSCOPY N/A 10/02/2023   Procedure: EGD (ESOPHAGOGASTRODUODENOSCOPY);  Surgeon: Kenney Peacemaker, MD;  Location: [facility];  Service: Gastroenterology;  Laterality: N/A;   SPHINCTEROTOMY  10/04/2023   Procedure: SPHINCTEROTOMY, BILIARY;  Surgeon: Ozell Blunt, MD;  Location: [facility];  Service: Gastroenterology;;    History reviewed. No pertinent family history.  Social History:  reports that he has quit smoking. His smoking use included cigarettes. He has been exposed to tobacco smoke. He has never used smokeless tobacco. He reports that he does  not currently use alcohol. He reports that he does not currently use drugs.  Allergies:  Allergies  Allergen Reactions   Oxycontin  [Oxycodone ]     "Crazy dreams"    Medications: I have reviewed the patient's current medications.  Results for orders placed or performed during the hospital encounter of 10/31/23 (from the past 48 hours)  Lipase, blood     Status: None   Collection Time: 10/31/23  1:04 PM  Result Value Ref Range   Lipase 25 11 - 51 U/L    Comment: Performed at [facility], [address] [place], [place] [address]  Comprehensive metabolic panel     Status: Abnormal   Collection Time: 10/31/23  1:04 PM  Result Value Ref Range   Sodium 138 135 - 145 mmol/L   Potassium 3.0 (L) 3.5 - 5.1 mmol/L   Chloride 104 98 - 111 mmol/L   CO2 25 22 - 32 mmol/L   Glucose, Bld 115 (H) 70 - 99 mg/dL    Comment: Glucose reference range applies only to samples taken after fasting for at least 8 hours.   BUN 16 8 - 23 mg/dL   Creatinine, Ser 6.04 0.61 - 1.24 mg/dL   Calcium 8.7 (L) 8.9 - 10.3 mg/dL   Total Protein 7.3 6.5 - 8.1 g/dL   Albumin 3.0 (L) 3.5 - 5.0 g/dL   AST 540 (H) 15 - 41 U/L   ALT 157 (H) 0 - 44 U/L   Alkaline Phosphatase 268 (H) 38 - 126 U/L   Total Bilirubin 10.5 (H) 0.0 -  1.2 mg/dL   GFR, Estimated >16 >10 mL/min    Comment: (NOTE) Calculated using the CKD-EPI Creatinine Equation (2021)    Anion gap 9 5 - 15    Comment: Performed at [facility], [address], [place], [place] [address]  CBC     Status: Abnormal   Collection Time: 10/31/23  1:04 PM  Result Value Ref Range   WBC 8.8 4.0 - 10.5 K/uL   RBC 4.25 4.22 - 5.81 MIL/uL   Hemoglobin 11.8 (L) 13.0 - 17.0 g/dL   HCT 40.9 (L) 81.1 - 91.4 %   MCV 88.2 80.0 - 100.0 fL   MCH 27.8 26.0 - 34.0 pg   MCHC 31.5 30.0 - 36.0 g/dL   RDW 78.2 95.6 - 21.3 %   Platelets 261 150 - 400 K/uL   nRBC 0.0 0.0 - 0.2 %    Comment: Performed at [facility], [address], [place], [place] [address]  Urinalysis, Routine w reflex microscopic -Urine, Clean Catch     Status: Abnormal   Collection Time: 10/31/23  1:04 PM  Result Value Ref Range   Color, Urine AMBER (A) YELLOW    Comment: BIOCHEMICALS MAY BE AFFECTED BY COLOR   APPearance CLEAR CLEAR   Specific Gravity, Urine 1.030 1.005 - 1.030   pH 5.0 5.0 - 8.0   Glucose, UA NEGATIVE NEGATIVE mg/dL   Hgb urine dipstick NEGATIVE NEGATIVE   Bilirubin Urine MODERATE (A) NEGATIVE   Ketones, ur NEGATIVE NEGATIVE mg/dL   Protein, ur 846 (A) NEGATIVE mg/dL   Nitrite NEGATIVE NEGATIVE   Leukocytes,Ua NEGATIVE NEGATIVE   RBC / HPF 0-5 0 - 5 RBC/hpf   WBC, UA 0-5 0 - 5 WBC/hpf   Bacteria, UA NONE SEEN NONE SEEN   Squamous Epithelial / HPF 6-10 0 - 5 /HPF   Mucus PRESENT     Comment: Performed at [facility], [address], [place], [place] [address]  CBC     Status: Abnormal   Collection Time: 11/01/23  5:11 AM  Result Value Ref Range   WBC 8.5 4.0 - 10.5 K/uL   RBC 3.98 (L) 4.22 - 5.81 MIL/uL   Hemoglobin 11.0 (L) 13.0 - 17.0 g/dL   HCT 28.4 (L) 13.2 - 44.0 %   MCV 88.7 80.0 - 100.0 fL   MCH 27.6 26.0 - 34.0 pg   MCHC 31.2 30.0 - 36.0 g/dL   RDW 10.2 72.5 - 36.6 %   Platelets 227 150 - 400 K/uL   nRBC 0.0 0.0 - 0.2 %    Comment: Performed at [facility], [address] [place], [place] [address]  Comprehensive metabolic panel     Status: Abnormal   Collection Time: 11/01/23  5:11 AM  Result Value Ref Range   Sodium 135 135 - 145 mmol/L   Potassium 3.2 (L) 3.5 - 5.1 mmol/L   Chloride 101 98 - 111 mmol/L   CO2 23 22 - 32 mmol/L   Glucose, Bld 71 70 - 99 mg/dL    Comment: Glucose reference range applies only to samples taken after fasting for at least 8 hours.   BUN 19 8 - 23 mg/dL   Creatinine, Ser 7.42 0.61 - 1.24 mg/dL   Calcium 8.4 (L) 8.9 - 10.3 mg/dL   Total Protein 6.7 6.5 - 8.1 g/dL   Albumin 2.7 (L) 3.5 - 5.0 g/dL   AST 595 (H) 15 - 41  U/L  ALT 147 (H) 0 - 44 U/L   Alkaline Phosphatase 240 (H) 38 - 126 U/L   Total Bilirubin 10.0 (H) 0.0 - 1.2 mg/dL   GFR, Estimated >16 >10 mL/min    Comment: (NOTE) Calculated using the CKD-EPI Creatinine Equation (2021)    Anion gap 11 5 - 15    Comment: Performed at [facility], [address], [place], [place] [address]  Magnesium     Status: None   Collection Time: 11/01/23  5:11 AM  Result Value Ref Range   Magnesium 2.2 1.7 - 2.4 mg/dL    Comment: Performed at [facility], [address], [place], [place] [address]  Phosphorus     Status: None   Collection Time: 11/01/23  5:11 AM  Result Value Ref Range   Phosphorus 3.3 2.5 - 4.6 mg/dL    Comment: ICTERUS AT THIS LEVEL MAY AFFECT RESULT Performed at [facility], [address], [place], [place] [address]   Bilirubin, direct     Status: Abnormal   Collection Time: 11/01/23  5:11 AM  Result Value Ref Range   Bilirubin, Direct 5.7 (H) 0.0 - 0.2 mg/dL    Comment: Performed at [facility], [address], [place], [place] [address]  Protime-INR     Status: None   Collection Time: 11/01/23  6:28 AM  Result Value Ref Range   Prothrombin Time 14.3 11.4 - 15.2 seconds   INR 1.1 0.8 - 1.2    Comment: (NOTE) INR goal varies based on device and disease states. Performed at [facility], [address], [place], [place] [address]     CT ABDOMEN PELVIS W CONTRAST Result Date: 11/01/2023 CLINICAL DATA:  Postop abdominal pain. Gallbladder removed 2 weeks ago. Since surgery he has been vomiting. Urine is dark in color. Elevated liver enzymes. EXAM: CT ABDOMEN AND PELVIS WITH CONTRAST TECHNIQUE: Multidetector CT imaging of the abdomen and pelvis was performed using the standard protocol following bolus administration of intravenous contrast. RADIATION DOSE REDUCTION: This exam was performed according to the departmental dose-optimization  program which includes automated exposure control, adjustment of the mA and/or kV according to patient size and/or use of iterative reconstruction technique. CONTRAST:  OMNIPAQUE  IOHEXOL  300 MG/ML  SOLN COMPARISON:  MRI abdomen 10/01/2023 and CT abdomen pelvis 09/30/2023 FINDINGS: Lower chest: No acute abnormality. Hepatobiliary: Cholecystectomy. Trace stranding and fluid in the cholecystectomy bed is favored postoperative. Unchanged intra and extrahepatic biliary dilation. No radiopaque stone. The common bile duct again measures 1.1 cm. Scattered hepatic cysts. Pancreas: No pancreatic ductal dilation. No peripancreatic fat stranding. Spleen: Unremarkable. Adrenals/Urinary Tract: Unremarkable adrenal glands. No urinary calculi or hydronephrosis. 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 Stomach/Bowel: Normal caliber large and small bowel. No bowel wall thickening. Colonic diverticulosis without diverticulitis. Normal appendix. Stomach is within normal limits. Duodenal diverticulum. Vascular/Lymphatic: Aortic atherosclerosis. No enlarged abdominal or pelvic lymph nodes. Reproductive: Unremarkable. Other: No free intraperitoneal fluid or air. Bilateral fat containing inguinal hernias greater on the left. The anterior left bladder partially herniates into the left inguinal hernia. Musculoskeletal: No acute fracture. IMPRESSION: 1. No acute abnormality in the abdomen or pelvis. 2. Interval cholecystectomy. Trace stranding and fluid in the cholecystectomy bed is favored postoperative. 3. Unchanged intra and extrahepatic biliary dilation. No radiopaque stone. 4. 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 5. Aortic Atherosclerosis (ICD10-I70.0). Electronically Signed   By: Herminia Lope  Stutzman M.D.   On: 11/01/2023 00:05    Review of Systems negative except above Blood pressure (!) 140/87, pulse 82, temperature 97.8 F (36.6 C), temperature  source Temporal, resp. rate 15, height 6\' 1"  (1.854 m), weight 79.8 kg, SpO2 96%. Physical Exam vital signs stable afebrile no acute distress exam please see preassessment evaluation CT and labs reviewed  Assessment/Plan: Obstructive jaundice secondary to retained stone versus stricture Plan: Will proceed with repeat ERCP which was discussed with the patient further workup and plans pending those findings and we discussed possible stenting as well case had been discussed earlier with his wife who is in agreement as well  Niva Murren E 11/01/2023, 1:02 PM

## 2023-11-02 ENCOUNTER — Encounter (HOSPITAL_COMMUNITY): Payer: Self-pay | Admitting: Gastroenterology

## 2023-11-02 ENCOUNTER — Other Ambulatory Visit (HOSPITAL_COMMUNITY): Payer: Self-pay

## 2023-11-02 DIAGNOSIS — R112 Nausea with vomiting, unspecified: Secondary | ICD-10-CM | POA: Diagnosis not present

## 2023-11-02 LAB — CBC
HCT: 37.9 % — ABNORMAL LOW (ref 39.0–52.0)
Hemoglobin: 11.6 g/dL — ABNORMAL LOW (ref 13.0–17.0)
MCH: 27.4 pg (ref 26.0–34.0)
MCHC: 30.6 g/dL (ref 30.0–36.0)
MCV: 89.6 fL (ref 80.0–100.0)
Platelets: 267 10*3/uL (ref 150–400)
RBC: 4.23 MIL/uL (ref 4.22–5.81)
RDW: 14.3 % (ref 11.5–15.5)
WBC: 9.5 10*3/uL (ref 4.0–10.5)
nRBC: 0 % (ref 0.0–0.2)

## 2023-11-02 LAB — COMPREHENSIVE METABOLIC PANEL WITH GFR
ALT: 128 U/L — ABNORMAL HIGH (ref 0–44)
AST: 96 U/L — ABNORMAL HIGH (ref 15–41)
Albumin: 2.6 g/dL — ABNORMAL LOW (ref 3.5–5.0)
Alkaline Phosphatase: 233 U/L — ABNORMAL HIGH (ref 38–126)
Anion gap: 8 (ref 5–15)
BUN: 14 mg/dL (ref 8–23)
CO2: 25 mmol/L (ref 22–32)
Calcium: 8.6 mg/dL — ABNORMAL LOW (ref 8.9–10.3)
Chloride: 105 mmol/L (ref 98–111)
Creatinine, Ser: 0.74 mg/dL (ref 0.61–1.24)
GFR, Estimated: >60 mL/min (ref >60)
Glucose, Bld: 139 mg/dL — ABNORMAL HIGH (ref 70–99)
Potassium: 3.8 mmol/L (ref 3.5–5.1)
Sodium: 138 mmol/L (ref 135–145)
Total Bilirubin: 5.2 mg/dL — ABNORMAL HIGH (ref 0.0–1.2)
Total Protein: 6.3 g/dL — ABNORMAL LOW (ref 6.5–8.1)

## 2023-11-02 MED ORDER — POLYETHYLENE GLYCOL 3350 17 GM/SCOOP PO POWD
17.0000 g | Freq: Every day | ORAL | 0 refills | Status: AC | PRN
  Filled 2023-11-02: qty 238, 14d supply, fill #0

## 2023-11-02 MED ORDER — CIPROFLOXACIN HCL 500 MG PO TABS
500.0000 mg | ORAL_TABLET | Freq: Two times a day (BID) | ORAL | Status: DC
  Administered 2023-11-02: 500 mg via ORAL
  Filled 2023-11-02: qty 1

## 2023-11-02 MED ORDER — CIPROFLOXACIN HCL 500 MG PO TABS
500.0000 mg | ORAL_TABLET | Freq: Two times a day (BID) | ORAL | 0 refills | Status: AC
  Filled 2023-11-02: qty 8, 4d supply, fill #0

## 2023-11-02 NOTE — Progress Notes (Signed)
 Andrew Douglas 11:03 AM  Subjective: Patient doing fine from ERCP and case discussed with the surgical team and he is tolerating soft foods and has no new complaints that his throat was a little sore after the last procedure we answered all of his questions and discussed activity warnings and follow-up plan Objective: Vital signs stable afebrile no acute distress abdomen is soft nontender LFTs decreased white count okay  Assessment: CBD stricture and cholangitis  Plan: Okay with me to go home today or tomorrow and if he stays would continue IV antibiotics till discharge and treat him with a total of 5 days of Cipro  and will arrange follow-up labs next week in my office and if patient is doing well I will then see him in 2 weeks and I will call me sooner as needed and as above the warnings and what to look for were discussed with the patient  [facility]  office [phone] After 5PM or if no answer call [phone]

## 2023-11-02 NOTE — Hospital Course (Addendum)
 Brief Narrative:  78 year old with history of hepatitis C cirrhosis status posttreatment, HTN, recent cholecystectomy on 3/24 for cholecystitis admitted for transaminitis along with nausea and vomiting.  ERCP 4/22, biliary sphincterotomy performed, temporary covered metal biliary stent placed in CBD.  Diet as tolerated, GI recommending discharge on 5 days of Cipro  with outpatient follow-up. Today feeling well tolerating p.o.  Will discharge the patient   Assessment & Plan:  Principal Problem:   Intractable nausea and vomiting Active Problems:   Hyperbilirubinemia     Hyperbilirubinemia  Elevated LFT's History of recent cholecystectomy 3/24 S/p lap cholecystectomy 3/24 for cholecystitis with IOC (concern for common bile duct stone due to poor flow out of ampulla)  ERCP 4/22, biliary sphincterotomy performed, temporary covered metal biliary stent placed in CBD.  Diet as tolerated, GI recommending discharge on 5 days of Cipro  with outpatient follow-up in 2 weeks and stent removal in 6-8 weeks. -LFTs are overall improving and tolerating p.o.   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  EDP has consulted Dr. Dorrie Gaudier  DVT prophylaxis: SCDs Start: 11/01/23 0858 SCDs Start: 11/01/23 0040      Code Status: Full Code Family Communication:   Discharge today    Subjective:  Feels well no complaints  Examination:  General exam: Appears calm and comfortable  Respiratory system: Clear to auscultation. Respiratory effort normal. Cardiovascular system: S1 & S2 heard, RRR. No JVD, murmurs, rubs, gallops or clicks. No pedal edema. Gastrointestinal system: Abdomen is nondistended, soft and nontender. No organomegaly or masses felt. Normal bowel sounds heard. Central nervous system: Alert and oriented. No focal neurological  deficits. Extremities: Symmetric 5 x 5 power. Skin: No rashes, lesions or ulcers Psychiatry: Judgement and insight appear normal. Mood & affect appropriate.

## 2023-11-02 NOTE — Discharge Summary (Signed)
 Physician Discharge Summary  Andrew Douglas BMW:[id number] DOB: [birth year] DOA: 10/31/2023  PCP: Andrew Graven, MD (Inactive)  Admit date: 10/31/2023 Discharge date: 11/02/2023  Admitted From: Home Disposition: Home  Recommendations for Outpatient Follow-up:  Follow up with PCP in 1-2 weeks Please obtain CMP/CBC in one week your next doctors visit.  4 more days of p.o. Cipro  to complete total 5-day course Outpatient follow-up with GI next 1-2 weeks Will likely need repeat ERCP in about 6-8 weeks   Discharge Condition: Stable CODE STATUS: Full code Diet recommendation: Regular as tolerated  Brief/Interim Summary: Brief Narrative:  78 year old with history of hepatitis C cirrhosis status posttreatment, HTN, recent cholecystectomy on 3/24 for cholecystitis admitted for transaminitis along with nausea and vomiting.  ERCP 4/22, biliary sphincterotomy performed, temporary covered metal biliary stent placed in CBD.  Diet as tolerated, GI recommending discharge on 5 days of Cipro  with outpatient follow-up. Today feeling well tolerating p.o.  Will discharge the patient   Assessment & Plan:  Principal Problem:   Intractable nausea and vomiting Active Problems:   Hyperbilirubinemia     Hyperbilirubinemia  Elevated LFT's History of recent cholecystectomy 3/24 S/p lap cholecystectomy 3/24 for cholecystitis with IOC (concern for common bile duct stone due to poor flow out of ampulla)  ERCP 4/22, biliary sphincterotomy performed, temporary covered metal biliary stent placed in CBD.  Diet as tolerated, GI recommending discharge on 5 days of Cipro  with outpatient follow-up in 2 weeks and stent removal in 6-8 weeks. -LFTs are overall improving and tolerating p.o.   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  EDP has consulted Dr.  Dorrie Gaudier  DVT prophylaxis: SCDs Start: 11/01/23 0858 SCDs Start: 11/01/23 0040      Code Status: Full Code Family Communication:   Discharge today    Subjective:  Feels well no complaints  Examination:  General exam: Appears calm and comfortable  Respiratory system: Clear to auscultation. Respiratory effort normal. Cardiovascular system: S1 & S2 heard, RRR. No JVD, murmurs, rubs, gallops or clicks. No pedal edema. Gastrointestinal system: Abdomen is nondistended, soft and nontender. No organomegaly or masses felt. Normal bowel sounds heard. Central nervous system: Alert and oriented. No focal neurological deficits. Extremities: Symmetric 5 x 5 power. Skin: No rashes, lesions or ulcers Psychiatry: Judgement and insight appear normal. Mood & affect appropriate.    Discharge Diagnoses:  Principal Problem:   Intractable nausea and vomiting Active Problems:   Hyperbilirubinemia      Discharge Exam: Vitals:   11/01/23 2015 11/02/23 0420  BP: 115/76 (!) 142/80  Pulse: 79 64  Resp: 18 16  Temp: 98.5 F (36.9 C) 98 F (36.7 C)  SpO2: 97% 100%   Vitals:   11/01/23 1430 11/01/23 1825 11/01/23 2015 11/02/23 0420  BP: 122/79 108/74 115/76 (!) 142/80  Pulse: 80 71 79 64  Resp: 10 16 18 16   Temp:  97.6 F (36.4 C) 98.5 F (36.9 C) 98 F (36.7 C)  TempSrc:      SpO2: 94% 95% 97% 100%  Weight:      Height:          Discharge Instructions   Allergies as of 11/02/2023       Reactions   Oxycontin  [oxycodone ]    "Crazy dreams"        Medication List     STOP taking these medications  pantoprazole  20 MG tablet Commonly known as: Protonix        TAKE these medications    ascorbic acid 500 MG tablet Commonly known as: VITAMIN C Take 500 mg by mouth daily.   aspirin EC 81 MG tablet Take 81 mg by mouth daily. Swallow whole.   benzonatate  200 MG capsule Commonly known as: TESSALON  Take 200 mg by mouth 3 (three) times daily as needed for  cough.   ciprofloxacin  500 MG tablet Commonly known as: CIPRO  Take 1 tablet (500 mg total) by mouth 2 (two) times daily for 4 days.   dorzolamide -timolol  2-0.5 % ophthalmic solution Commonly known as: COSOPT  Place 1 drop into both eyes 2 (two) times daily.   losartan-hydrochlorothiazide 100-12.5 MG tablet Commonly known as: HYZAAR Take 1 tablet by mouth daily.   multivitamin with minerals Tabs tablet Take 1 tablet by mouth daily.   omeprazole  20 MG tablet Commonly known as: PRILOSEC OTC Take 20 mg by mouth daily.   polyethylene glycol powder 17 GM/SCOOP powder Commonly known as: GLYCOLAX /MIRALAX  Take 17 g by mouth daily as needed for mild constipation or moderate constipation. Mix in 4-8 ounces of liquid as directed.   RED YEAST RICE PO Take 1 capsule by mouth daily at 12 noon.   Travoprost (BAK Free) 0.004 % Soln ophthalmic solution Commonly known as: TRAVATAN Place 1 drop into both eyes at bedtime.        Follow-up Information     Andrew Graven, MD Follow up in 1 week(s).   Specialty: Family Medicine Contact information: [phone] [address] [place] [address] [phone]                Allergies  Allergen Reactions   Oxycontin  [Oxycodone ]     "Crazy dreams"    You were cared for by a hospitalist during your hospital stay. If you have any questions about your discharge medications or the care you received while you were in the hospital after you are discharged, you can call the unit and asked to speak with the hospitalist on call if the hospitalist that took care of you is not available. Once you are discharged, your primary care physician will handle any further medical issues. Please note that no refills for any discharge medications will be authorized once you are discharged, as it is imperative that you return to your primary care physician (or establish a relationship with a primary care physician if you do not have one) for your aftercare  needs so that they can reassess your need for medications and monitor your lab values.  You were cared for by a hospitalist during your hospital stay. If you have any questions about your discharge medications or the care you received while you were in the hospital after you are discharged, you can call the unit and asked to speak with the hospitalist on call if the hospitalist that took care of you is not available. Once you are discharged, your primary care physician will handle any further medical issues. Please note that NO REFILLS for any discharge medications will be authorized once you are discharged, as it is imperative that you return to your primary care physician (or establish a relationship with a primary care physician if you do not have one) for your aftercare needs so that they can reassess your need for medications and monitor your lab values.  Please request your Prim.MD to go over all Hospital Tests and Procedure/Radiological results at the follow up, please get  all Hospital records sent to your Prim MD by signing hospital release before you go home.  Get CBC, CMP, 2 view Chest X ray checked  by Primary MD during your next visit or SNF MD in 5-7 days ( we routinely change or add medications that can affect your baseline labs and fluid status, therefore we recommend that you get the mentioned basic workup next visit with your PCP, your PCP may decide not to get them or add new tests based on their clinical decision)  On your next visit with your primary care physician please Get Medicines reviewed and adjusted.  If you experience worsening of your admission symptoms, develop shortness of breath, life threatening emergency, suicidal or homicidal thoughts you must seek medical attention immediately by calling 911 or calling your MD immediately  if symptoms less severe.  You Must read complete instructions/literature along with all the possible adverse reactions/side effects for all the  Medicines you take and that have been prescribed to you. Take any new Medicines after you have completely understood and accpet all the possible adverse reactions/side effects.   Do not drive, operate heavy machinery, perform activities at heights, swimming or participation in water activities or provide baby sitting services if your were admitted for syncope or siezures until you have seen by Primary MD or a Neurologist and advised to do so again.  Do not drive when taking Pain medications.   Procedures/Studies: DG ERCP Result Date: 11/02/2023 CLINICAL DATA:  [id number] Surgery, elective [id number] EXAM: ERCP COMPARISON:  CT AP, 10/31/2023.  ERCP, 10/04/2023. FLUOROSCOPY: Exposure Index (as provided by the fluoroscopic device): 19.1 mGy Kerma FINDINGS: Limited oblique planar images of the RIGHT upper quadrant obtained C-arm. Images demonstrating flexible endoscopy, biliary duct cannulation, sphincterotomy, retrograde cholangiogram and balloon sweep. No biliary ductal dilation. No evidence of biliary filling defect is demonstrated. A distal common biliary duct metallic stent is placed at the end of the case. IMPRESSION: Fluoroscopic imaging for ERCP and distal CBD metallic stent placement. For complete description of intra procedural findings, please see performing service dictation. Electronically Signed   By: Art Largo M.D.   On: 11/02/2023 07:28   CT ABDOMEN PELVIS W CONTRAST Result Date: 11/01/2023 CLINICAL DATA:  Postop abdominal pain. Gallbladder removed 2 weeks ago. Since surgery he has been vomiting. Urine is dark in color. Elevated liver enzymes. EXAM: CT ABDOMEN AND PELVIS WITH CONTRAST TECHNIQUE: Multidetector CT imaging of the abdomen and pelvis was performed using the standard protocol following bolus administration of intravenous contrast. RADIATION DOSE REDUCTION: This exam was performed according to the departmental dose-optimization program which includes automated exposure control,  adjustment of the mA and/or kV according to patient size and/or use of iterative reconstruction technique. CONTRAST:  OMNIPAQUE  IOHEXOL  300 MG/ML  SOLN COMPARISON:  MRI abdomen 10/01/2023 and CT abdomen pelvis 09/30/2023 FINDINGS: Lower chest: No acute abnormality. Hepatobiliary: Cholecystectomy. Trace stranding and fluid in the cholecystectomy bed is favored postoperative. Unchanged intra and extrahepatic biliary dilation. No radiopaque stone. The common bile duct again measures 1.1 cm. Scattered hepatic cysts. Pancreas: No pancreatic ductal dilation. No peripancreatic fat stranding. Spleen: Unremarkable. Adrenals/Urinary Tract: Unremarkable adrenal glands. No urinary calculi or hydronephrosis. 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 Stomach/Bowel: Normal caliber large and small bowel. No bowel wall thickening. Colonic diverticulosis without diverticulitis. Normal appendix. Stomach is within normal limits. Duodenal diverticulum. Vascular/Lymphatic: Aortic atherosclerosis. No enlarged abdominal or pelvic lymph nodes. Reproductive: Unremarkable. Other: No free intraperitoneal fluid or  air. Bilateral fat containing inguinal hernias greater on the left. The anterior left bladder partially herniates into the left inguinal hernia. Musculoskeletal: No acute fracture. IMPRESSION: 1. No acute abnormality in the abdomen or pelvis. 2. Interval cholecystectomy. Trace stranding and fluid in the cholecystectomy bed is favored postoperative. 3. Unchanged intra and extrahepatic biliary dilation. No radiopaque stone. 4. 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 5. Aortic Atherosclerosis (ICD10-I70.0). Electronically Signed   By: Rozell Cornet M.D.   On: 11/01/2023 00:05   DG ERCP Result Date: 10/05/2023 CLINICAL DATA:  78 year old male status post cholecystectomy. EXAM: ERCP TECHNIQUE: Multiple spot images obtained with the fluoroscopic device  and submitted for interpretation post-procedure. FLUOROSCOPY TIME:  80.50 mGy COMPARISON:  10/02/2023, 10/03/2023 FINDINGS: Seven fluoroscopic images are submitted. Endoscope is visualized positioned in the second portion the duodenum. There is retrograde cannulation of the common bile duct. Retrograde cholangiogram demonstrates no evidence of significant intra or extrahepatic biliary ductal dilation. IMPRESSION: ERCP as above. These images were submitted for radiologic interpretation only. Please see the procedural report for full procedural details, the amount of contrast, and the fluoroscopy time utilized. Creasie Doctor, MD [facility] Radiology Electronically Signed   By: Creasie Doctor M.D.   On: 10/05/2023 07:14     The results of significant diagnostics from this hospitalization (including imaging, microbiology, ancillary and laboratory) are listed below for reference.     Microbiology: No results found for this or any previous visit (from the past 240 hours).   Labs: BNP (last 3 results) No results for input(s): "BNP" in the last 8760 hours. Basic Metabolic Panel: Recent Labs  Lab 10/31/23 1304 11/01/23 0511 11/02/23 0647  NA 138 135 138  K 3.0* 3.2* 3.8  CL 104 101 105  CO2 25 23 25   GLUCOSE 115* 71 139*  BUN 16 19 14   CREATININE 0.82 0.67 0.74  CALCIUM 8.7* 8.4* 8.6*  MG  --  2.2  --   PHOS  --  3.3  --    Liver Function Tests: Recent Labs  Lab 10/31/23 1304 11/01/23 0511 11/02/23 0647  AST 155* 135* 96*  ALT 157* 147* 128*  ALKPHOS 268* 240* 233*  BILITOT 10.5* 10.0* 5.2*  PROT 7.3 6.7 6.3*  ALBUMIN 3.0* 2.7* 2.6*   Recent Labs  Lab 10/31/23 1304  LIPASE 25   No results for input(s): "AMMONIA" in the last 168 hours. CBC: Recent Labs  Lab 10/31/23 1304 11/01/23 0511 11/02/23 0444  WBC 8.8 8.5 9.5  HGB 11.8* 11.0* 11.6*  HCT 37.5* 35.3* 37.9*  MCV 88.2 88.7 89.6  PLT 261 227 267   Cardiac Enzymes: No  results for input(s): "CKTOTAL", "CKMB", "CKMBINDEX", "TROPONINI" in the last 168 hours. BNP: Invalid input(s): "POCBNP" CBG: No results for input(s): "GLUCAP" in the last 168 hours. D-Dimer No results for input(s): "DDIMER" in the last 72 hours. Hgb A1c No results for input(s): "HGBA1C" in the last 72 hours. Lipid Profile No results for input(s): "CHOL", "HDL", "LDLCALC", "TRIG", "CHOLHDL", "LDLDIRECT" in the last 72 hours. Thyroid function studies No results for input(s): "TSH", "T4TOTAL", "T3FREE", "THYROIDAB" in the last 72 hours.  Invalid input(s): "FREET3" Anemia work up No results for input(s): "VITAMINB12", "FOLATE", "FERRITIN", "TIBC", "IRON", "RETICCTPCT" in the last 72 hours. Urinalysis    Component Value Date/Time   COLORURINE AMBER (A) 10/31/2023 1304   APPEARANCEUR CLEAR 10/31/2023 1304   LABSPEC 1.030 10/31/2023 1304   PHURINE 5.0 10/31/2023 1304  GLUCOSEU NEGATIVE 10/31/2023 1304   HGBUR NEGATIVE 10/31/2023 1304   BILIRUBINUR MODERATE (A) 10/31/2023 1304   KETONESUR NEGATIVE 10/31/2023 1304   PROTEINUR 100 (A) 10/31/2023 1304   NITRITE NEGATIVE 10/31/2023 1304   LEUKOCYTESUR NEGATIVE 10/31/2023 1304   Sepsis Labs Recent Labs  Lab 10/31/23 1304 11/01/23 0511 11/02/23 0444  WBC 8.8 8.5 9.5   Microbiology No results found for this or any previous visit (from the past 240 hours).   Time coordinating discharge:  I have spent 35 minutes face to face with the patient and on the ward discussing the patients care, assessment, plan and disposition with other care givers. >50% of the time was devoted counseling the patient about the risks and benefits of treatment/Discharge disposition and coordinating care.   SIGNED:   Maggie Schooner, MD  [facility] 11/02/2023, 11:46 AM   If 7PM-7AM, please contact night-coverage

## 2023-11-02 NOTE — Anesthesia Postprocedure Evaluation (Signed)
 Anesthesia Post Note  Patient: Andrew Douglas  Procedure(s) Performed: ERCP, WITH INTERVENTION IF INDICATED     Patient location during evaluation: Endoscopy Anesthesia Type: General Level of consciousness: awake and alert Pain management: pain level controlled Vital Signs Assessment: post-procedure vital signs reviewed and stable Respiratory status: spontaneous breathing, nonlabored ventilation, respiratory function stable and patient connected to nasal cannula oxygen Cardiovascular status: blood pressure returned to baseline and stable Postop Assessment: no apparent nausea or vomiting Anesthetic complications: no  No notable events documented.  Last Vitals:  Vitals:   11/01/23 2015 11/02/23 0420  BP: 115/76 (!) 142/80  Pulse: 79 64  Resp: 18 16  Temp: 36.9 C 36.7 C  SpO2: 97% 100%    Last Pain:  Vitals:   11/02/23 0930  TempSrc:   PainSc: 0-No pain                 Rosalita Combe

## 2023-11-02 NOTE — Progress Notes (Signed)
 Progress Note  1 Day Post-Op  Subjective: Pt reports some mild throat pain and soreness in stomach. Passing flatus and tolerated CLD without n/v.   Objective: Vital signs in last 24 hours: Temp:  [97.6 F (36.4 C)-98.5 F (36.9 C)] 98 F (36.7 C) (04/23 0420) Pulse Rate:  [64-88] 64 (04/23 0420) Resp:  [10-19] 16 (04/23 0420) BP: (108-154)/(74-87) 142/80 (04/23 0420) SpO2:  [93 %-100 %] 100 % (04/23 0420) Weight:  [79.8 kg-80.6 kg] 79.8 kg (04/22 1208) Last BM Date : 11/01/23  Intake/Output from previous day: 04/22 0701 - 04/23 0700 In: 520 [P.O.:120; I.V.:400] Out: -  Intake/Output this shift: No intake/output data recorded.  PE: General: pleasant, WD, WN male who is laying in bed in NAD HEENT: sclera anicteric  Heart: regular, rate, and rhythm.   Lungs:  Respiratory effort nonlabored Abd: soft, NT, ND, incisions well healing  Skin: warm and dry with no masses, lesions, or rashes Psych: A&Ox3 with an appropriate affect.    Lab Results:  Recent Labs    11/01/23 0511 11/02/23 0444  WBC 8.5 9.5  HGB 11.0* 11.6*  HCT 35.3* 37.9*  PLT 227 267   BMET Recent Labs    11/01/23 0511 11/02/23 0647  NA 135 138  K 3.2* 3.8  CL 101 105  CO2 23 25  GLUCOSE 71 139*  BUN 19 14  CREATININE 0.67 0.74  CALCIUM 8.4* 8.6*   PT/INR Recent Labs    11/01/23 0628  LABPROT 14.3  INR 1.1   CMP     Component Value Date/Time   NA 138 11/02/2023 0647   K 3.8 11/02/2023 0647   CL 105 11/02/2023 0647   CO2 25 11/02/2023 0647   GLUCOSE 139 (H) 11/02/2023 0647   BUN 14 11/02/2023 0647   CREATININE 0.74 11/02/2023 0647   CALCIUM 8.6 (L) 11/02/2023 0647   PROT 6.3 (L) 11/02/2023 0647   ALBUMIN 2.6 (L) 11/02/2023 0647   AST 96 (H) 11/02/2023 0647   ALT 128 (H) 11/02/2023 0647   ALKPHOS 233 (H) 11/02/2023 0647   BILITOT 5.2 (H) 11/02/2023 0647   GFRNONAA >60 11/02/2023 0647   Lipase     Component Value Date/Time   LIPASE 25 10/31/2023 1304        Studies/Results: DG ERCP Result Date: 11/02/2023 CLINICAL DATA:  [id number] Surgery, elective [id number] EXAM: ERCP COMPARISON:  CT AP, 10/31/2023.  ERCP, 10/04/2023. FLUOROSCOPY: Exposure Index (as provided by the fluoroscopic device): 19.1 mGy Kerma FINDINGS: Limited oblique planar images of the RIGHT upper quadrant obtained C-arm. Images demonstrating flexible endoscopy, biliary duct cannulation, sphincterotomy, retrograde cholangiogram and balloon sweep. No biliary ductal dilation. No evidence of biliary filling defect is demonstrated. A distal common biliary duct metallic stent is placed at the end of the case. IMPRESSION: Fluoroscopic imaging for ERCP and distal CBD metallic stent placement. For complete description of intra procedural findings, please see performing service dictation. Electronically Signed   By: Art Largo M.D.   On: 11/02/2023 07:28   CT ABDOMEN PELVIS W CONTRAST Result Date: 11/01/2023 CLINICAL DATA:  Postop abdominal pain. Gallbladder removed 2 weeks ago. Since surgery he has been vomiting. Urine is dark in color. Elevated liver enzymes. EXAM: CT ABDOMEN AND PELVIS WITH CONTRAST TECHNIQUE: Multidetector CT imaging of the abdomen and pelvis was performed using the standard protocol following bolus administration of intravenous contrast. RADIATION DOSE REDUCTION: This exam was performed according to the departmental dose-optimization program which includes automated exposure control, adjustment of the mA  and/or kV according to patient size and/or use of iterative reconstruction technique. CONTRAST:  OMNIPAQUE  IOHEXOL  300 MG/ML  SOLN COMPARISON:  MRI abdomen 10/01/2023 and CT abdomen pelvis 09/30/2023 FINDINGS: Lower chest: No acute abnormality. Hepatobiliary: Cholecystectomy. Trace stranding and fluid in the cholecystectomy bed is favored postoperative. Unchanged intra and extrahepatic biliary dilation. No radiopaque stone. The common bile duct again measures 1.1 cm.  Scattered hepatic cysts. Pancreas: No pancreatic ductal dilation. No peripancreatic fat stranding. Spleen: Unremarkable. Adrenals/Urinary Tract: Unremarkable adrenal glands. No urinary calculi or hydronephrosis. 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 Stomach/Bowel: Normal caliber large and small bowel. No bowel wall thickening. Colonic diverticulosis without diverticulitis. Normal appendix. Stomach is within normal limits. Duodenal diverticulum. Vascular/Lymphatic: Aortic atherosclerosis. No enlarged abdominal or pelvic lymph nodes. Reproductive: Unremarkable. Other: No free intraperitoneal fluid or air. Bilateral fat containing inguinal hernias greater on the left. The anterior left bladder partially herniates into the left inguinal hernia. Musculoskeletal: No acute fracture. IMPRESSION: 1. No acute abnormality in the abdomen or pelvis. 2. Interval cholecystectomy. Trace stranding and fluid in the cholecystectomy bed is favored postoperative. 3. Unchanged intra and extrahepatic biliary dilation. No radiopaque stone. 4. 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 5. Aortic Atherosclerosis (ICD10-I70.0). Electronically Signed   By: Rozell Cornet M.D.   On: 11/01/2023 00:05    Anti-infectives: Anti-infectives (From admission, onward)    Start     Dose/Rate Route Frequency Ordered Stop   11/02/23 0100  ciprofloxacin  (CIPRO ) IVPB 400 mg        400 mg 200 mL/hr over 60 Minutes Intravenous Every 12 hours 11/01/23 1506          Assessment/Plan S/P laparoscopic cholecystectomy with IOC 3/24 Dr. Afton Horse Hyperbilirubinemia with intra and extrahepatic biliary dilation suggestive of obstruction  - CT today with trace stranding and fluid in RUQ which is favored to be post-operative - no significant RUQ abdominal pain . No leukocytosis and hgb is stable - pt underwent ERCP pre-op 3/23 which was aborted for mucosal tear of the  stomach - pt underwent ERCP post-op 3/25 given IOC suggestive of obstruction distally - no stones or debris noted and biliary sphincterotomy was performed by Dr. Lavaughn Portland  - s/p ERCP yesterday by Dr. Lavaughn [place], prior sphincterotomy appeared stenosed, sphincterotomy performed again and stent placed - Tbili down to 5 from 10 - diet advancement per GI - clear for discharge from surgical perspective when ok with GI. Follow up PRN in surgical office.   FEN: soft diet, IVF per [facility] VTE: SCDs ID: cipro    LOS: 1 day     Andrew Douglas, [facility] Surgery 11/02/2023, 9:17 AM Please see Amion for pager number during day hours 7:00am-4:30pm

## 2023-11-02 NOTE — Progress Notes (Signed)
 TOC meds delivered in  a secure bag to pt in room  by this RN

## 2023-11-08 DIAGNOSIS — R945 Abnormal results of liver function studies: Secondary | ICD-10-CM | POA: Diagnosis not present

## 2023-11-08 DIAGNOSIS — R112 Nausea with vomiting, unspecified: Secondary | ICD-10-CM | POA: Diagnosis not present

## 2023-11-08 DIAGNOSIS — D649 Anemia, unspecified: Secondary | ICD-10-CM | POA: Diagnosis not present

## 2023-11-08 DIAGNOSIS — R7989 Other specified abnormal findings of blood chemistry: Secondary | ICD-10-CM | POA: Diagnosis not present

## 2023-11-08 DIAGNOSIS — K219 Gastro-esophageal reflux disease without esophagitis: Secondary | ICD-10-CM | POA: Diagnosis not present

## 2023-11-20 DIAGNOSIS — J019 Acute sinusitis, unspecified: Secondary | ICD-10-CM | POA: Diagnosis not present

## 2023-11-21 DIAGNOSIS — D508 Other iron deficiency anemias: Secondary | ICD-10-CM | POA: Diagnosis not present

## 2023-11-21 DIAGNOSIS — K831 Obstruction of bile duct: Secondary | ICD-10-CM | POA: Diagnosis not present

## 2023-11-21 DIAGNOSIS — R7989 Other specified abnormal findings of blood chemistry: Secondary | ICD-10-CM | POA: Diagnosis not present

## 2023-11-21 DIAGNOSIS — R945 Abnormal results of liver function studies: Secondary | ICD-10-CM | POA: Diagnosis not present

## 2023-12-14 ENCOUNTER — Other Ambulatory Visit (HOSPITAL_COMMUNITY): Payer: Self-pay

## 2023-12-22 DIAGNOSIS — H401112 Primary open-angle glaucoma, right eye, moderate stage: Secondary | ICD-10-CM | POA: Diagnosis not present

## 2024-02-21 DIAGNOSIS — K4090 Unilateral inguinal hernia, without obstruction or gangrene, not specified as recurrent: Secondary | ICD-10-CM | POA: Diagnosis not present

## 2024-03-13 ENCOUNTER — Ambulatory Visit: Payer: Self-pay | Admitting: Surgery

## 2024-03-13 DIAGNOSIS — K4090 Unilateral inguinal hernia, without obstruction or gangrene, not specified as recurrent: Secondary | ICD-10-CM | POA: Diagnosis not present

## 2024-03-16 ENCOUNTER — Other Ambulatory Visit: Payer: Self-pay | Admitting: Gastroenterology

## 2024-03-27 NOTE — Progress Notes (Signed)
 Anesthesia Review:  PCP: Cardiologist :  PPM/ ICD: Device Orders: Rep Notified:  Chest x-ray : EKG : 11/01/23  Echo : Stress test: Cardiac Cath :   Activity level:  Sleep Study/ CPAP : Fasting Blood Sugar :      / Checks Blood Sugar -- times a day:    Blood Thinner/ Instructions /Last Dose: ASA / Instructions/ Last Dose :  81 mg aspirin    11/01/23-ERCP  03/27/24- LVMM and asked for call back.

## 2024-03-29 ENCOUNTER — Other Ambulatory Visit: Payer: Self-pay

## 2024-03-29 ENCOUNTER — Encounter (HOSPITAL_COMMUNITY): Payer: Self-pay | Admitting: Gastroenterology

## 2024-04-03 ENCOUNTER — Ambulatory Visit (HOSPITAL_COMMUNITY): Admitting: Anesthesiology

## 2024-04-03 ENCOUNTER — Ambulatory Visit (HOSPITAL_COMMUNITY)
Admission: RE | Admit: 2024-04-03 | Discharge: 2024-04-03 | Disposition: A | Discharge status: Home / Self Care | Attending: Gastroenterology | Admitting: Gastroenterology

## 2024-04-03 ENCOUNTER — Encounter (HOSPITAL_COMMUNITY)
Admission: RE | Disposition: A | Discharge status: Home / Self Care | Payer: Self-pay | Source: Home / Self Care | Attending: Gastroenterology

## 2024-04-03 ENCOUNTER — Other Ambulatory Visit: Payer: Self-pay

## 2024-04-03 ENCOUNTER — Encounter (HOSPITAL_COMMUNITY): Payer: Self-pay | Admitting: Gastroenterology

## 2024-04-03 ENCOUNTER — Ambulatory Visit (HOSPITAL_COMMUNITY)

## 2024-04-03 DIAGNOSIS — Y848 Other medical procedures as the cause of abnormal reaction of the patient, or of later complication, without mention of misadventure at the time of the procedure: Secondary | ICD-10-CM | POA: Diagnosis not present

## 2024-04-03 DIAGNOSIS — B1920 Unspecified viral hepatitis C without hepatic coma: Secondary | ICD-10-CM | POA: Insufficient documentation

## 2024-04-03 DIAGNOSIS — R932 Abnormal findings on diagnostic imaging of liver and biliary tract: Secondary | ICD-10-CM | POA: Insufficient documentation

## 2024-04-03 DIAGNOSIS — K831 Obstruction of bile duct: Secondary | ICD-10-CM | POA: Insufficient documentation

## 2024-04-03 DIAGNOSIS — R17 Unspecified jaundice: Secondary | ICD-10-CM | POA: Diagnosis not present

## 2024-04-03 DIAGNOSIS — Z87891 Personal history of nicotine dependence: Secondary | ICD-10-CM | POA: Insufficient documentation

## 2024-04-03 DIAGNOSIS — I10 Essential (primary) hypertension: Secondary | ICD-10-CM | POA: Diagnosis not present

## 2024-04-03 DIAGNOSIS — T85590A Other mechanical complication of bile duct prosthesis, initial encounter: Secondary | ICD-10-CM | POA: Diagnosis not present

## 2024-04-03 DIAGNOSIS — K219 Gastro-esophageal reflux disease without esophagitis: Secondary | ICD-10-CM | POA: Diagnosis not present

## 2024-04-03 DIAGNOSIS — K838 Other specified diseases of biliary tract: Secondary | ICD-10-CM | POA: Diagnosis not present

## 2024-04-03 DIAGNOSIS — Z4659 Encounter for fitting and adjustment of other gastrointestinal appliance and device: Secondary | ICD-10-CM | POA: Diagnosis not present

## 2024-04-03 DIAGNOSIS — C240 Malignant neoplasm of extrahepatic bile duct: Secondary | ICD-10-CM | POA: Insufficient documentation

## 2024-04-03 DIAGNOSIS — C221 Intrahepatic bile duct carcinoma: Secondary | ICD-10-CM | POA: Diagnosis not present

## 2024-04-03 HISTORY — PX: ERCP: SHX5425

## 2024-04-03 SURGERY — ERCP, WITH INTERVENTION IF INDICATED
Anesthesia: General

## 2024-04-03 MED ORDER — LIDOCAINE 2% (20 MG/ML) 5 ML SYRINGE
INTRAMUSCULAR | Status: DC | PRN
  Administered 2024-04-03: 100 mg via INTRAVENOUS

## 2024-04-03 MED ORDER — PHENYLEPHRINE 80 MCG/ML (10ML) SYRINGE FOR IV PUSH (FOR BLOOD PRESSURE SUPPORT)
PREFILLED_SYRINGE | INTRAVENOUS | Status: DC | PRN
Start: 2024-04-03 — End: 2024-04-03
  Administered 2024-04-03 (×3): 160 ug via INTRAVENOUS

## 2024-04-03 MED ORDER — FENTANYL CITRATE (PF) 100 MCG/2ML IJ SOLN
INTRAMUSCULAR | Status: DC | PRN
  Administered 2024-04-03: 50 ug via INTRAVENOUS

## 2024-04-03 MED ORDER — SODIUM CHLORIDE 0.9 % IV SOLN
INTRAVENOUS | Status: DC | PRN
  Administered 2024-04-03: 20 mL

## 2024-04-03 MED ORDER — SODIUM CHLORIDE 0.9 % IV SOLN: INTRAVENOUS | Status: DC

## 2024-04-03 MED ORDER — INDOMETHACIN 50 MG RE SUPP
RECTAL | Status: AC
  Filled 2024-04-03: qty 2

## 2024-04-03 MED ORDER — CIPROFLOXACIN IN D5W 400 MG/200ML IV SOLN
INTRAVENOUS | Status: AC
  Filled 2024-04-03: qty 200

## 2024-04-03 MED ORDER — CIPROFLOXACIN IN D5W 400 MG/200ML IV SOLN
INTRAVENOUS | Status: DC | PRN
  Administered 2024-04-03: 400 mg via INTRAVENOUS

## 2024-04-03 MED ORDER — PROPOFOL 10 MG/ML IV BOLUS
INTRAVENOUS | Status: DC | PRN
  Administered 2024-04-03: 120 mg via INTRAVENOUS

## 2024-04-03 MED ORDER — PROPOFOL 1000 MG/100ML IV EMUL
INTRAVENOUS | Status: AC
Start: 2024-04-03 — End: 2024-04-03
  Filled 2024-04-03: qty 100

## 2024-04-03 MED ORDER — DICLOFENAC SUPPOSITORY 100 MG
RECTAL | Status: AC
  Filled 2024-04-03: qty 1

## 2024-04-03 MED ORDER — DEXAMETHASONE SODIUM PHOSPHATE 10 MG/ML IJ SOLN
INTRAMUSCULAR | Status: DC | PRN
  Administered 2024-04-03: 5 mg via INTRAVENOUS

## 2024-04-03 MED ORDER — PROPOFOL 500 MG/50ML IV EMUL
INTRAVENOUS | Status: AC
  Filled 2024-04-03: qty 50

## 2024-04-03 MED ORDER — SUGAMMADEX SODIUM 200 MG/2ML IV SOLN
INTRAVENOUS | Status: DC | PRN
  Administered 2024-04-03: 200 mg via INTRAVENOUS

## 2024-04-03 MED ORDER — ONDANSETRON HCL 4 MG/2ML IJ SOLN
INTRAMUSCULAR | Status: DC | PRN
  Administered 2024-04-03: 4 mg via INTRAVENOUS

## 2024-04-03 MED ORDER — GLUCAGON HCL RDNA (DIAGNOSTIC) 1 MG IJ SOLR
INTRAMUSCULAR | Status: AC
  Filled 2024-04-03: qty 1

## 2024-04-03 MED ORDER — FENTANYL CITRATE (PF) 100 MCG/2ML IJ SOLN
INTRAMUSCULAR | Status: AC
  Filled 2024-04-03: qty 2

## 2024-04-03 MED ORDER — DICLOFENAC SUPPOSITORY 100 MG
RECTAL | Status: DC | PRN
Start: 2024-04-03 — End: 2024-04-03
  Administered 2024-04-03: 100 mg via RECTAL

## 2024-04-03 MED ORDER — ROCURONIUM BROMIDE 10 MG/ML (PF) SYRINGE
PREFILLED_SYRINGE | INTRAVENOUS | Status: DC | PRN
  Administered 2024-04-03: 50 mg via INTRAVENOUS

## 2024-04-03 NOTE — Anesthesia Preprocedure Evaluation (Addendum)
 Anesthesia Evaluation  Patient identified by MRN, date of birth, ID band Patient awake    Reviewed: Allergy & Precautions, NPO status , Patient's Chart, lab work & pertinent test results  Airway Mallampati: II  TM Distance: >3 FB Neck ROM: Full    Dental  (+) Dental Advisory Given, Missing, Caps   Pulmonary former smoker   Pulmonary exam normal breath sounds clear to auscultation       Cardiovascular hypertension, Pt. on medications Normal cardiovascular exam Rhythm:Regular Rate:Normal     Neuro/Psych negative neurological ROS  negative psych ROS   GI/Hepatic ,GERD  Medicated,,(+) Hepatitis -, CBile duct stent    Endo/Other  negative endocrine ROS    Renal/GU negative Renal ROS     Musculoskeletal negative musculoskeletal ROS (+)    Abdominal   Peds  Hematology negative hematology ROS (+)   Anesthesia Other Findings Day of surgery medications reviewed with the patient.  Reproductive/Obstetrics                              Anesthesia Physical Anesthesia Plan  ASA: 3  Anesthesia Plan: General   Post-op Pain Management: Minimal or no pain anticipated   Induction: Intravenous  PONV Risk Score and Plan: 2 and Dexamethasone  and Ondansetron   Airway Management Planned: Oral ETT  Additional Equipment:   Intra-op Plan:   Post-operative Plan: Extubation in OR  Informed Consent: I have reviewed the patients History and Physical, chart, labs and discussed the procedure including the risks, benefits and alternatives for the proposed anesthesia with the patient or authorized representative who has indicated his/her understanding and acceptance.     Dental advisory given  Plan Discussed with: CRNA  Anesthesia Plan Comments:          Anesthesia Quick Evaluation

## 2024-04-03 NOTE — Transfer of Care (Signed)
 Immediate Anesthesia Transfer of Care Note  Patient: Andrew Douglas  Procedure(s) Performed: ERCP, WITH INTERVENTION IF INDICATED  Patient Location: PACU  Anesthesia Type:General  Level of Consciousness: sedated  Airway & Oxygen Therapy: Patient Spontanous Breathing and Patient connected to face mask oxygen  Post-op Assessment: Report given to RN and Post -op Vital signs reviewed and stable  Post vital signs: Reviewed and stable  Last Vitals:  Vitals Value Taken Time  BP 124/69 04/03/24 10:06  Temp    Pulse 75 04/03/24 10:07  Resp 13 04/03/24 10:07  SpO2 100 % 04/03/24 10:07  Vitals shown include unfiled device data.  Last Pain:  Vitals:   04/03/24 0815  TempSrc: Temporal  PainSc: 0-No pain         Complications: No notable events documented.

## 2024-04-03 NOTE — Anesthesia Procedure Notes (Signed)
 Procedure Name: Intubation Date/Time: 04/03/2024 9:18 AM  Performed by: Carleton Garnette SAUNDERS, CRNAPre-anesthesia Checklist: Patient identified, Emergency Drugs available, Suction available, Patient being monitored and Timeout performed Patient Re-evaluated:Patient Re-evaluated prior to induction Oxygen Delivery Method: Circle system utilized Preoxygenation: Pre-oxygenation with 100% oxygen Induction Type: IV induction Ventilation: Mask ventilation without difficulty Laryngoscope Size: Mac and 4 Grade View: Grade I Tube type: Oral Tube size: 7.5 mm Number of attempts: 1 Airway Equipment and Method: Stylet Placement Confirmation: ETT inserted through vocal cords under direct vision, positive ETCO2 and breath sounds checked- equal and bilateral Secured at: 23 cm Tube secured with: Tape Dental Injury: Teeth and Oropharynx as per pre-operative assessment

## 2024-04-03 NOTE — Op Note (Signed)
 [facility] Patient Name: Andrew Douglas Procedure Date: 04/03/2024 MRN: [id number] Attending MD: Oliva Boots , MD, [id number] Date of Birth: 1945/09/22 CSN: [id number] Age: 79 Admit Type: Outpatient Procedure:                ERCP Indications:              Abnormal MRCP, Jaundice history of distal biliary                            stricture Providers:                Oliva Boots, MD, Jacquelyn Jaci Pierce, RN,                            Coye Bade, Technician Referring MD:              Medicines:                General Anesthesia Complications:            No immediate complications. Estimated Blood Loss:     Estimated blood loss: none. Estimated blood loss                            was minimal. Procedure:                Pre-Anesthesia Assessment:                           - Prior to the procedure, a History and Physical                            was performed, and patient medications and                            allergies were reviewed. The patient's tolerance of                            previous anesthesia was also reviewed. The risks                            and benefits of the procedure and the sedation                            options and risks were discussed with the patient.                            All questions were answered, and informed consent                            was obtained. Prior Anticoagulants: The patient has                            taken no anticoagulant or antiplatelet agents. ASA                            Grade Assessment: II - A patient  with mild systemic                            disease. After reviewing the risks and benefits,                            the patient was deemed in satisfactory condition to                            undergo the procedure.                           After obtaining informed consent, the scope was                            passed under direct vision. Throughout the                             procedure, the patient's blood pressure, pulse, and                            oxygen saturations were monitored continuously. The                            [id number]-[id number] ([id number] Olympus duodenoscope was                            introduced through the mouth, and used to inject                            contrast into and used to locate the major papilla.                            The ERCP was accomplished without difficulty. The                            patient tolerated the procedure well. Based on                            previous ERCPs the procedure was done with him on                            his left side Scope In: Scope Out: Findings:      One covered metal stent originating in the biliary tree was emerging       from the major papilla. The stent was partially occluded. One stent was       removed from the biliary tree using a Raptor grasping device and sent       for cytology. The major papilla was small and difficult to find but no       abnormality or mass was seen. Deep selective cannulation was readily       obtained and no abnormality was seen on initial cholangiogram and the       biliary tree was swept with a 12 mm balloon starting at the  bifurcation       multiple times. Sludge was swept from the duct. After a few balloon       pull-through's we proceeded with an occlusion cholangiogram in the       customary fashion and there appeared to be adequate biliary drainage and       no obvious residual stricture but we still proceeded with obtaining       cells for cytology were obtained by brushing in the lower third of the       main bile duct. The wire and scope were then removed and the patient       tolerated the procedure well and there was no pancreatic duct injection       or wire advancement throughout the procedure Impression:               - One partially occluded stent from the biliary                            tree was seen in the major papilla.                            - The major papilla appeared normal.                           - One stent was removed from the biliary tree.                           - The biliary tree was swept and sludge was found.                           - Cells for cytology obtained in the lower third of                            the main duct. Moderate Sedation:      Not Applicable - Patient had care per Anesthesia. Recommendation:           - Clear liquid diet for 6 hours. If doing well at 4                            PM may have soft solid                           - Continue present medications.                           - Avoid aspirin and nonsteroidal anti-inflammatory                            medicines for 3 days.                           - Return to GI clinic in 4-6 weeks.                           - Telephone GI clinic for pathology results in 1  week.                           - Telephone GI clinic if symptomatic PRN.                            Particularly if abdominal pain fever or signs of GI                            bleeding or yellow jaundice or dark urine Procedure Code(s):        --- Professional ---                           [phone], Esophagogastroduodenoscopy, flexible,                            transoral; with removal of foreign body(s) Diagnosis Code(s):        --- Professional ---                           T85.590A, Other mechanical complication of bile                            duct prosthesis, initial encounter                           Z46.59, Encounter for fitting and adjustment of                            other gastrointestinal appliance and device                           R17, Unspecified jaundice                           R93.2, Abnormal findings on diagnostic imaging of                            liver and biliary tract CPT copyright 2022 American [employer]. All rights reserved. The codes documented in this report are preliminary  and upon coder review may  be revised to meet current compliance requirements. Oliva Boots, MD 04/03/2024 10:13:03 AM This report has been signed electronically. Number of Addenda: 0

## 2024-04-03 NOTE — Anesthesia Postprocedure Evaluation (Signed)
 Anesthesia Post Note  Patient: Andrew Douglas  Procedure(s) Performed: ERCP, WITH INTERVENTION IF INDICATED     Patient location during evaluation: Endoscopy Anesthesia Type: General Level of consciousness: awake and alert Pain management: pain level controlled Vital Signs Assessment: post-procedure vital signs reviewed and stable Respiratory status: spontaneous breathing, nonlabored ventilation, respiratory function stable and patient connected to nasal cannula oxygen Cardiovascular status: blood pressure returned to baseline and stable Postop Assessment: no apparent nausea or vomiting Anesthetic complications: no   No notable events documented.  Last Vitals:  Vitals:   04/03/24 1010 04/03/24 1020  BP: (P) 115/83 117/77  Pulse: 84 81  Resp: 14 14  Temp:    SpO2: 99% 95%    Last Pain:  Vitals:   04/03/24 1020  TempSrc:   PainSc: 0-No pain                 [name]

## 2024-04-03 NOTE — Progress Notes (Signed)
 Andrew Douglas 8:51 AM  Subjective: Patient doing well without any problems since we last saw him and we rediscussed the procedure and he is not on any blood thinners  Objective: Vital signs stable afebrile no acute distress exam please see preassessment evaluation  Assessment: CBD stricture  Plan: Okay to proceed with stent removal and repeat ERCP to see if stricture still present with anesthesia assistance  [facility]  office [phone] After 5PM or if no answer call [phone]

## 2024-04-03 NOTE — Discharge Instructions (Addendum)
 Clear liquid diet until 4 PM and if doing well may have soft solids this evening and okay to advance activity tomorrow and call if GI question or problem otherwise call in 1 week for tissue sample results and if doing well follow-up in 4 to 6 weeks  YOU HAD AN ENDOSCOPIC PROCEDURE TODAY: Refer to the procedure report and other information in the discharge instructions given to you for any specific questions about what was found during the examination. If this information does not answer your questions, please call [facility] office at [phone] to clarify.   YOU SHOULD EXPECT: Some feelings of bloating in the abdomen. Passage of more gas than usual. Walking can help get rid of the air that was put into your GI tract during the procedure and reduce the bloating. If you had a lower endoscopy (such as a colonoscopy or flexible sigmoidoscopy) you may notice spotting of blood in your stool or on the toilet paper. Some abdominal soreness may be present for a day or two, also.  DIET: Your first meal following the procedure should be a light meal and then it is ok to progress to your normal diet. A half-sandwich or bowl of soup is an example of a good first meal. Heavy or fried foods are harder to digest and may make you feel nauseous or bloated. Drink plenty of fluids but you should avoid alcoholic beverages for 24 hours. If you had a esophageal dilation, please see attached instructions for diet.    ACTIVITY: Your care partner should take you home directly after the procedure. You should plan to take it easy, moving slowly for the rest of the day. You can resume normal activity the day after the procedure however YOU SHOULD NOT DRIVE, use power tools, machinery or perform tasks that involve climbing or major physical exertion for 24 hours (because of the sedation medicines used during the test).   SYMPTOMS TO REPORT IMMEDIATELY: A gastroenterologist can be reached at any hour. Please call [phone]  for any  of the following symptoms:  Following lower endoscopy (colonoscopy, flexible sigmoidoscopy) Excessive amounts of blood in the stool  Significant tenderness, worsening of abdominal pains  Swelling of the abdomen that is new, acute  Fever of 100 or higher  Following upper endoscopy (EGD, EUS, ERCP, esophageal dilation) Vomiting of blood or coffee ground material  New, significant abdominal pain  New, significant chest pain or pain under the shoulder blades  Painful or persistently difficult swallowing  New shortness of breath  Black, tarry-looking or red, bloody stools  FOLLOW UP:  If any biopsies were taken you will be contacted by phone or by letter within the next 1-3 weeks. Call [phone]  if you have not heard about the biopsies in 3 weeks.  Please also call with any specific questions about appointments or follow up tests. YOU HAD AN ENDOSCOPIC PROCEDURE TODAY: Refer to the procedure report and other information in the discharge instructions given to you for any specific questions about what was found during the examination. If this information does not answer your questions, please call [facility] office at [phone] to clarify.

## 2024-04-04 ENCOUNTER — Encounter (HOSPITAL_COMMUNITY): Payer: Self-pay | Admitting: Gastroenterology

## 2024-04-05 LAB — CYTOLOGY - NON PAP

## 2024-04-06 DIAGNOSIS — K831 Obstruction of bile duct: Secondary | ICD-10-CM | POA: Diagnosis not present

## 2024-04-06 DIAGNOSIS — C221 Intrahepatic bile duct carcinoma: Secondary | ICD-10-CM | POA: Diagnosis not present

## 2024-04-09 ENCOUNTER — Other Ambulatory Visit: Payer: Self-pay | Admitting: Gastroenterology

## 2024-04-09 DIAGNOSIS — C221 Intrahepatic bile duct carcinoma: Secondary | ICD-10-CM

## 2024-04-09 DIAGNOSIS — K831 Obstruction of bile duct: Secondary | ICD-10-CM

## 2024-04-10 ENCOUNTER — Other Ambulatory Visit: Payer: Self-pay | Admitting: Gastroenterology

## 2024-04-10 DIAGNOSIS — K831 Obstruction of bile duct: Secondary | ICD-10-CM

## 2024-04-10 DIAGNOSIS — C221 Intrahepatic bile duct carcinoma: Secondary | ICD-10-CM

## 2024-04-12 ENCOUNTER — Ambulatory Visit (HOSPITAL_COMMUNITY)
Admission: RE | Admit: 2024-04-12 | Discharge: 2024-04-12 | Disposition: A | Discharge status: Home / Self Care | Source: Ambulatory Visit | Attending: Gastroenterology | Admitting: Gastroenterology

## 2024-04-12 ENCOUNTER — Other Ambulatory Visit: Payer: Self-pay | Admitting: Gastroenterology

## 2024-04-12 DIAGNOSIS — K838 Other specified diseases of biliary tract: Secondary | ICD-10-CM | POA: Diagnosis not present

## 2024-04-12 DIAGNOSIS — K8689 Other specified diseases of pancreas: Secondary | ICD-10-CM | POA: Diagnosis not present

## 2024-04-12 DIAGNOSIS — K7689 Other specified diseases of liver: Secondary | ICD-10-CM | POA: Diagnosis not present

## 2024-04-12 DIAGNOSIS — K831 Obstruction of bile duct: Secondary | ICD-10-CM | POA: Insufficient documentation

## 2024-04-12 DIAGNOSIS — N281 Cyst of kidney, acquired: Secondary | ICD-10-CM | POA: Diagnosis not present

## 2024-04-12 DIAGNOSIS — C221 Intrahepatic bile duct carcinoma: Secondary | ICD-10-CM | POA: Diagnosis not present

## 2024-04-12 MED ORDER — GADOBUTROL 1 MMOL/ML IV SOLN
8.0000 mL | Freq: Once | INTRAVENOUS | Status: AC | PRN
  Administered 2024-04-12: 8 mL via INTRAVENOUS

## 2024-04-16 DIAGNOSIS — C221 Intrahepatic bile duct carcinoma: Secondary | ICD-10-CM | POA: Diagnosis not present

## 2024-04-17 DIAGNOSIS — K7402 Hepatic fibrosis, advanced fibrosis: Secondary | ICD-10-CM | POA: Diagnosis not present

## 2024-04-17 DIAGNOSIS — C221 Intrahepatic bile duct carcinoma: Secondary | ICD-10-CM | POA: Diagnosis not present

## 2024-04-19 ENCOUNTER — Other Ambulatory Visit

## 2024-04-20 ENCOUNTER — Inpatient Hospital Stay

## 2024-04-20 ENCOUNTER — Inpatient Hospital Stay: Attending: Oncology | Admitting: Oncology

## 2024-04-20 ENCOUNTER — Telehealth: Payer: Self-pay

## 2024-04-20 VITALS — BP 116/62 | HR 101 | Temp 97.5°F | Resp 16 | Ht 73.0 in | Wt 178.3 lb

## 2024-04-20 DIAGNOSIS — C240 Malignant neoplasm of extrahepatic bile duct: Secondary | ICD-10-CM | POA: Diagnosis present

## 2024-04-20 DIAGNOSIS — C249 Malignant neoplasm of biliary tract, unspecified: Secondary | ICD-10-CM

## 2024-04-20 DIAGNOSIS — Z87891 Personal history of nicotine dependence: Secondary | ICD-10-CM | POA: Insufficient documentation

## 2024-04-20 LAB — CBC WITH DIFFERENTIAL (CANCER CENTER ONLY)
Abs Immature Granulocytes: 0.01 K/uL (ref 0.00–0.07)
Basophils Absolute: 0 K/uL (ref 0.0–0.1)
Basophils Relative: 1 %
Eosinophils Absolute: 0.2 K/uL (ref 0.0–0.5)
Eosinophils Relative: 2 %
HCT: 37.4 % — ABNORMAL LOW (ref 39.0–52.0)
Hemoglobin: 12.4 g/dL — ABNORMAL LOW (ref 13.0–17.0)
Immature Granulocytes: 0 %
Lymphocytes Relative: 28 %
Lymphs Abs: 2.2 K/uL (ref 0.7–4.0)
MCH: 28.2 pg (ref 26.0–34.0)
MCHC: 33.2 g/dL (ref 30.0–36.0)
MCV: 85 fL (ref 80.0–100.0)
Monocytes Absolute: 0.8 K/uL (ref 0.1–1.0)
Monocytes Relative: 10 %
Neutro Abs: 4.6 K/uL (ref 1.7–7.7)
Neutrophils Relative %: 59 %
Platelet Count: 291 K/uL (ref 150–400)
RBC: 4.4 MIL/uL (ref 4.22–5.81)
RDW: 14.8 % (ref 11.5–15.5)
WBC Count: 7.8 K/uL (ref 4.0–10.5)
nRBC: 0 % (ref 0.0–0.2)

## 2024-04-20 LAB — BASIC METABOLIC PANEL - CANCER CENTER ONLY
Anion gap: 7 (ref 5–15)
BUN: 18 mg/dL (ref 8–23)
CO2: 31 mmol/L (ref 22–32)
Calcium: 9.8 mg/dL (ref 8.9–10.3)
Chloride: 102 mmol/L (ref 98–111)
Creatinine: 1.03 mg/dL (ref 0.61–1.24)
GFR, Estimated: >60 mL/min (ref >60)
Glucose, Bld: 132 mg/dL — ABNORMAL HIGH (ref 70–99)
Potassium: 3.6 mmol/L (ref 3.5–5.1)
Sodium: 140 mmol/L (ref 135–145)

## 2024-04-20 LAB — HEPATIC FUNCTION PANEL
ALT: 148 U/L — ABNORMAL HIGH (ref 0–44)
AST: 88 U/L — ABNORMAL HIGH (ref 15–41)
Albumin: 3.9 g/dL (ref 3.5–5.0)
Alkaline Phosphatase: 373 U/L — ABNORMAL HIGH (ref 38–126)
Bilirubin, Direct: 2.1 mg/dL — ABNORMAL HIGH (ref 0.0–0.2)
Indirect Bilirubin: 1.6 mg/dL — ABNORMAL HIGH (ref 0.3–0.9)
Total Bilirubin: 3.7 mg/dL — ABNORMAL HIGH (ref 0.0–1.2)
Total Protein: 7 g/dL (ref 6.5–8.1)

## 2024-04-20 LAB — LACTATE DEHYDROGENASE: LDH: 168 U/L (ref 98–192)

## 2024-04-20 LAB — CEA (ACCESS): CEA (CHCC): <1 ng/mL (ref 0.00–5.00)

## 2024-04-20 NOTE — Progress Notes (Signed)
 PATIENT NAVIGATOR PROGRESS NOTE  Name: Andrew Douglas Date: 04/20/2024 MRN: [id number]  DOB: 12-15-1945   Reason for visit:  Initial Med/Onc visit with Dr. Chinita Pasam  Comments:   Patient was seen in office during his initial med/onc visit with Dr. Autumn. Patient was accompanied by his wife and son.  Patient was given Journey's binder containing information specific to his diagnosis.  Patient was given my direct contact information and was instructed to contact office with any questions or concerns.  Referral entered for Social Work.   Time spent counseling/coordinating care: > 60 minutes

## 2024-04-20 NOTE — Progress Notes (Unsigned)
 Tbili called to and read back by Rocky Haggard RN on [phone] at 1600 by [place], MLS

## 2024-04-20 NOTE — Telephone Encounter (Signed)
 CRITICAL VALUE STICKER  CRITICAL VALUE: Total bilirubin: 3.7  RECEIVER (on-site recipient of call): Rocky Haggard, RN   DATE & TIME NOTIFIED: 04/20/24 @ 1600  MESSENGER (representative from lab): Amber  MD NOTIFIED: Dr. Autumn   TIME OF NOTIFICATION: 04/20/24 @ 1602  RESPONSE: Provider aware. No new orders provided.

## 2024-04-20 NOTE — Progress Notes (Signed)
 [facility]  ONCOLOGY CONSULT NOTE   PATIENT NAME: [facility]   MR#: [id number] DOB: 03-02-1946  DATE OF SERVICE: 04/20/2024   REFERRING PROVIDER  Oliva Boots, MD  Patient Care Team: [name], PA as PCP - General (Physician Assistant) Ardis Evalene CROME, RN as Oncology Nurse Navigator Boots Oliva, MD as Consulting Physician (Gastroenterology) [name] Shoulders, MD as Consulting Physician (General Surgery)    CHIEF COMPLAINT/ PURPOSE OF CONSULTATION:   Newly diagnosed carcinoma of the biliary tract  ASSESSMENT & PLAN:   [facility] is a 78 y.o. gentleman with a past medical history of hepatitis C infection status posttreatment, currently in remission, hypertension, was referred to our clinic for newly diagnosed carcinoma of the biliary tract.  Carcinoma of biliary tract [facility]) Please review oncology history for additional details and timeline of events.  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  He had a biliary stent in place which was removed around that time.  On 04/12/2024, MRI abdomen/MRCP showed mild intrahepatic bile duct dilatation and dilation of the common bile duct measuring up to 1.1 cm proximally and midportion.  There was smooth walled stricture over short length at the level of ampulla of Vader.  No discrete obstructing mass or choledocholithiasis.  No pancreatic head mass.  Discussed findings so far with the patient and his family members who were accompanying.  Reviewed NCCN guidelines and discussed management options.  We will obtain CT of the chest for staging and to rule out any evidence of metastatic disease.  On MRI of the abdomen, there was no evidence of lymphadenopathy or other evidence of disease.  Likely early stage disease.  Surgical resection is the preferred treatment option, as chemotherapy is not typically beneficial preoperatively for this type of  cancer.  He has an upcoming appointment with Dr. [name] for surgical consultation.  Plan to discuss his case in GI multidisciplinary tumor conference.  Additional plan of care will be decided following his surgical evaluation.  Nausea and vomiting Intermittent nausea and vomiting, possibly related to dietary intake or bile duct obstruction. - Advise dietary modifications to limit fat and spicy foods.  I reviewed lab results and outside records for this visit and discussed relevant results with the patient. Diagnosis, plan of care and treatment options were also discussed in detail with the patient. Opportunity provided to ask questions and answers provided to his apparent satisfaction. Provided instructions to call our clinic with any problems, questions or concerns prior to return visit. I recommended to continue follow-up with PCP and sub-specialists. He verbalized understanding and agreed with the plan. No barriers to learning was detected.  NCCN guidelines have been consulted in the planning of this patient's care.  Deauna Yaw, MD  04/20/2024 4:04 PM  [facility] - A DEPT OF [facility] [address] [place] [address] Dept: [phone] Dept Fax: [phone]   HISTORY OF PRESENTING ILLNESS:   I have reviewed his chart and materials related to his cancer extensively and collaborated history with the patient. Summary of oncologic history is as follows:  ONCOLOGY HISTORY:  Patient has a history of hepatitis C, treated in the remote past and has been in remission.  In April 2025, he had to undergo cholecystectomy after he presented with nausea and vomiting after eating.  Postsurgery, he required a stent placement in the bile duct due to residual sludge and  bile duct issues.  He continue to follow-up with gastroenterology, Dr. [name].  On 04/03/2024, he underwent ERCP and his biliary stent was removed and a bile  duct sweep was performed around the same time.  Brushings from the distal bile duct came back positive for adenocarcinoma.  A referral was sent to us  for further evaluation.  On 04/12/2024, MRI abdomen/MRCP showed mild intrahepatic bile duct dilatation and dilation of the common bile duct measuring up to 1.1 cm proximally and midportion.  There was smooth walled stricture over short length at the level of ampulla of Vader.  No discrete obstructing mass or choledocholithiasis.  No pancreatic head mass.  On his consultation with us  on 04/20/2024, we requested CT chest for staging.  He has an upcoming appointment with Dr. [name] for surgical consultation.  Plan to discuss his case in GI multidisciplinary tumor conference.  INTERVAL HISTORY:  Discussed the use of AI scribe software for clinical note transcription with the patient, who gave verbal consent to proceed.  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  In April, he underwent a cholecystectomy due to nausea and sickness after eating.  Post-surgery, he required a stent placement in the bile duct due to residual sludge and bile duct issues. In September 2025, after a stent was removed and a bile duct sweep was performed, a biopsy was done and the patient was later informed that cancer was found.  He has a history of hepatitis C, which has been in remission for about a year following treatment. He does not recall the specific treatment but confirms it was effective. He has been under the care of a liver specialist for several years prior to the current issues.  Recently, he has experienced nausea and vomiting, particularly after eating and lying down. He reports a decrease in appetite and has lost about two pounds. No blood in stools or black stools, but he notes a change in urine color to darker shades, which he attributes to bile duct issues.  He is currently not  on any specific diet but avoids high-fat and spicy foods as they exacerbate his symptoms.   MEDICAL HISTORY:  Past Medical History:  Diagnosis Date   Hepatitis C    Hypertension     SURGICAL HISTORY: Past Surgical History:  Procedure Laterality Date   BACK SURGERY     a little back problem 20 or 25 years ago   BILIARY DILATION  10/04/2023   Procedure: DILATION, STRICTURE, BILE DUCT;  Surgeon: [name] Kitchens, MD;  Location: [facility];  Service: Gastroenterology;;   CHOLECYSTECTOMY N/A 10/03/2023   Procedure: LAPAROSCOPIC CHOLECYSTECTOMY WITH ICG DYE AND  INTRAOPERATIVE CHOLANGIOGRAM;  Surgeon: Vanderbilt Ned, MD;  Location[facility];  Service: General;  Laterality: N/A;  ICG   ERCP N/A 10/04/2023   Procedure: ERCP, WITH INTERVENTION IF INDICATED;  Surgeon: [name] Kitchens, MD;  Location[facility];  Service: Gastroenterology;  Laterality: N/A;   ERCP N/A 11/01/2023   Procedure: ERCP, WITH INTERVENTION IF INDICATED;  Surgeon: [name] Kitchens, MD;  [facility];  Service: Gastroenterology;  Laterality: N/A;   ERCP N/A 04/03/2024   Procedure: ERCP, WITH INTERVENTION IF INDICATED;  Surgeon: [name] Kitchens, MD;  [facility];  Service: Gastroenterology;  Laterality: N/A;  stent removal   ESOPHAGOGASTRODUODENOSCOPY N/A 10/02/2023   Procedure: EGD (ESOPHAGOGASTRODUODENOSCOPY);  Surgeon: Avram Lupita BRAVO, MD;  Location: [facility];  Service: Gastroenterology;  Laterality: N/A;  SPHINCTEROTOMY  10/04/2023   Procedure: SPHINCTEROTOMY, BILIARY;  Surgeon: [name] Kitchens, MD;  Location: [facility];  Service: Gastroenterology;;    SOCIAL HISTORY: He reports that he has quit smoking. His smoking use included cigarettes. He has been exposed to tobacco smoke. He has never used smokeless tobacco. He reports that he does not currently use alcohol. He reports that he does not currently use drugs. Social History   Socioeconomic History   Marital status: Married    Spouse name: Not on file    Number of children: Not on file   Years of education: Not on file   Highest education level: Not on file  Occupational History   Not on file  Tobacco Use   Smoking status: Former    Current packs/day: 1.00    Types: Cigarettes    Passive exposure: Past   Smokeless tobacco: Never   Tobacco comments:    Smoked 20years-1/2-1pk daily  Vaping Use   Vaping status: Never Used  Substance and Sexual Activity   Alcohol use: Not Currently   Drug use: Not Currently   Sexual activity: Not on file  Other Topics Concern   Not on file  Social History Narrative   Not on file   Social Drivers of Health   Financial Resource Strain: Low Risk  (04/19/2022)   Received from [facility]   Overall Financial Resource Strain (CARDIA)    Difficulty of Paying Living Expenses: Not hard at all  Food Insecurity: No Food Insecurity (04/20/2024)   Hunger Vital Sign    Worried About Running Out of Food in the Last Year: Never true    Ran Out of Food in the Last Year: Never true  Transportation Needs: No Transportation Needs (04/20/2024)   PRAPARE - Administrator, Civil Service (Medical): No    Lack of Transportation (Non-Medical): No  Physical Activity: Not on file  Stress: Not on file  Social Connections: Unknown (11/01/2023)   Social Connection and Isolation Panel    Frequency of Communication with Friends and Family: More than three times a week    Frequency of Social Gatherings with Friends and Family: More than three times a week    Attends Religious Services: More than 4 times per year    Active Member of [employer] or Organizations: Patient declined    Attends Banker Meetings: Patient declined    Marital Status: Married  Catering manager Violence: Not At Risk (04/20/2024)   Humiliation, Afraid, Rape, and Kick questionnaire    Fear of Current or Ex-Partner: No    Emotionally Abused: No    Physically Abused: No    Sexually Abused: No    FAMILY HISTORY: No family  history on file.  ALLERGIES:  He is allergic to oxycontin  [oxycodone ].  MEDICATIONS:  Current Outpatient Medications  Medication Sig Dispense Refill   ascorbic acid (VITAMIN C) 500 MG tablet Take 500 mg by mouth daily.     aspirin EC 81 MG tablet Take 81 mg by mouth daily. Swallow whole.     benzonatate  (TESSALON ) 200 MG capsule Take 200 mg by mouth 3 (three) times daily as needed for cough.     dorzolamide -timolol  (COSOPT ) 2-0.5 % ophthalmic solution Place 1 drop into both eyes 2 (two) times daily.     losartan-hydrochlorothiazide (HYZAAR) 100-12.5 MG tablet Take 1 tablet by mouth daily.     Multiple Vitamin (MULTIVITAMIN WITH MINERALS) TABS tablet Take 1 tablet by mouth daily.     omeprazole  (PRILOSEC  OTC) 20 MG tablet Take 20 mg by mouth daily.     polyethylene glycol powder (GLYCOLAX /MIRALAX ) 17 GM/SCOOP powder Take 17 g by mouth daily as needed for mild constipation or moderate constipation. Mix in 4-8 ounces of liquid as directed. 238 g 0   Red Yeast Rice Extract (RED YEAST RICE PO) Take 1 capsule by mouth daily at 12 noon.     Travoprost, BAK Free, (TRAVATAN) 0.004 % SOLN ophthalmic solution Place 1 drop into both eyes at bedtime.     No current facility-administered medications for this visit.    REVIEW OF SYSTEMS:    Review of Systems - Oncology  All other pertinent systems were reviewed with the patient and are negative.  PHYSICAL EXAMINATION:   Onc Performance Status - 04/20/24 1300       ECOG Perf Status   ECOG Perf Status Restricted in physically strenuous activity but ambulatory and able to carry out work of a light or sedentary nature, e.g., light house work, office work      KPS SCALE   KPS % SCORE Able to carry on normal activity, minor s/s of disease          Vitals:   04/20/24 1254  BP: 116/62  Pulse: (!) 101  Resp: 16  Temp: (!) 97.5 F (36.4 C)  SpO2: 99%   Filed Weights   04/20/24 1254  Weight: 178 lb 4.8 oz (80.9 kg)    Physical  Exam Constitutional:      General: He is not in acute distress.    Appearance: Normal appearance.  HENT:     Head: Normocephalic and atraumatic.  Eyes:     Conjunctiva/sclera: Conjunctivae normal.  Cardiovascular:     Rate and Rhythm: Normal rate and regular rhythm.  Pulmonary:     Effort: Pulmonary effort is normal. No respiratory distress.  Abdominal:     General: There is no distension.  Neurological:     General: No focal deficit present.     Mental Status: He is alert and oriented to person, place, and time.  Psychiatric:        Mood and Affect: Mood normal.        Behavior: Behavior normal.      LABORATORY DATA:   I have reviewed the data as listed.  Results for orders placed or performed in visit on 04/20/24  CBC with Differential (Cancer Center Only)  Result Value Ref Range   WBC Count 7.8 4.0 - 10.5 K/uL   RBC 4.40 4.22 - 5.81 MIL/uL   Hemoglobin 12.4 (L) 13.0 - 17.0 g/dL   HCT 62.5 (L) 60.9 - 47.9 %   MCV 85.0 80.0 - 100.0 fL   MCH 28.2 26.0 - 34.0 pg   MCHC 33.2 30.0 - 36.0 g/dL   RDW 85.1 88.4 - 84.4 %   Platelet Count 291 150 - 400 K/uL   nRBC 0.0 0.0 - 0.2 %   Neutrophils Relative % 59 %   Neutro Abs 4.6 1.7 - 7.7 K/uL   Lymphocytes Relative 28 %   Lymphs Abs 2.2 0.7 - 4.0 K/uL   Monocytes Relative 10 %   Monocytes Absolute 0.8 0.1 - 1.0 K/uL   Eosinophils Relative 2 %   Eosinophils Absolute 0.2 0.0 - 0.5 K/uL   Basophils Relative 1 %   Basophils Absolute 0.0 0.0 - 0.1 K/uL   Immature Granulocytes 0 %   Abs Immature Granulocytes 0.01 0.00 - 0.07 K/uL  Basic Metabolic Panel -  Cancer Center Only  Result Value Ref Range   Sodium 140 135 - 145 mmol/L   Potassium 3.6 3.5 - 5.1 mmol/L   Chloride 102 98 - 111 mmol/L   CO2 31 22 - 32 mmol/L   Glucose, Bld 132 (H) 70 - 99 mg/dL   BUN 18 8 - 23 mg/dL   Creatinine 8.96 9.38 - 1.24 mg/dL   Calcium 9.8 8.9 - 89.6 mg/dL   GFR, Estimated >39 >39 mL/min   Anion gap 7 5 - 15  Hepatic function panel   Result Value Ref Range   Total Protein 7.0 6.5 - 8.1 g/dL   Albumin 3.9 3.5 - 5.0 g/dL   AST 88 (H) 15 - 41 U/L   ALT 148 (H) 0 - 44 U/L   Alkaline Phosphatase 373 (H) 38 - 126 U/L   Total Bilirubin 3.7 (H) 0.0 - 1.2 mg/dL   Bilirubin, Direct 2.1 (H) 0.0 - 0.2 mg/dL   Indirect Bilirubin 1.6 (H) 0.3 - 0.9 mg/dL  Lactate dehydrogenase  Result Value Ref Range   LDH 168 98 - 192 U/L   RADIOGRAPHIC STUDIES:  I have personally reviewed the radiological images as listed and agree with the findings in the report.  MR ABDOMEN MRCP W WO CONTAST Result Date: 04/13/2024 CLINICAL DATA:  bile duct stricture. EXAM: MRI ABDOMEN WITHOUT AND WITH CONTRAST (INCLUDING MRCP) TECHNIQUE: Multiplanar multisequence MR imaging of the abdomen was performed both before and after the administration of intravenous contrast. Heavily T2-weighted images of the biliary and pancreatic ducts were obtained, and three-dimensional MRCP images were rendered by post processing. CONTRAST:  8mL GADAVIST  GADOBUTROL  1 MMOL/ML IV SOLN COMPARISON:  MRI abdomen from 10/01/2023 CT scan abdomen and pelvis from 10/31/2023. FINDINGS: Lower chest: There are diffuse peripheral/subpleural opacities in the visualized lungs. Correlate with prior chest CT scan from 05/05/2022. No pleural effusion. Mildly enlarged heart size. No pericardial effusion. Hepatobiliary: The liver is normal in size. Noncirrhotic configuration. There are multiple scattered simple cysts throughout the liver with largest measuring up to 1 cm. There is a subcapsular 6 x 8 mm arterial hyperenhancing lesion in the right hepatic lobe, segment 5, which is not seen on any other sequences. This may represent benign finding such as FNH (focal nodular hyperplasia) or hepatic adenoma. No suspicious liver lesion. Redemonstration of mild intrahepatic bile duct dilation. Extrahepatic bile duct is also dilated measuring up to 1.1 cm in the proximal portion and midportion. There is smooth  walled stricture over short length at the level of ampulla of Vater. The appearance is essentially similar to the prior study. There is no discrete obstructing mass or choledocholithiasis. The gallbladder is surgically absent. No abnormal collection seen in the cholecystectomy bed. Pancreas: Normal T1 hyperintense appearance of the pancreas. No focal lesion. Mildly prominent main pancreatic duct measuring up to 5-6 mm in the head region which gradually narrows measuring up to 3-4 mm at the level of ampulla of Vater. No peripancreatic fat stranding. Spleen:  Within normal limits in size and appearance. No focal mass. Adrenals/Urinary Tract: Unremarkable adrenal glands. No hydroureteronephrosis. Partially exophytic 6.9 x 7.2 cm cyst noted arising from the right kidney upper pole. No suspicious renal lesion. Stomach/Bowel: Visualized portions within the abdomen are unremarkable. No disproportionate dilation of bowel loops. Vascular/Lymphatic: No pathologically enlarged lymph nodes identified. No abdominal aortic aneurysm demonstrated. No ascites. Other:  None. Musculoskeletal: No suspicious bone lesions identified. IMPRESSION: 1. Redemonstration of mild intrahepatic bile duct dilation and dilation of the common  bile duct measuring up to 1.1 cm proximally and midportion. There is smooth walled stricture over short length at the level of Ampulla of Vater. No discrete obstructing mass or choledocholithiasis. No pancreatic head mass. 2. Multiple other nonacute observations, as described above. Electronically Signed   By: Ree Molt M.D.   On: 04/13/2024 08:45   MR 3D Recon At Scanner Result Date: 04/13/2024 CLINICAL DATA:  bile duct stricture. EXAM: MRI ABDOMEN WITHOUT AND WITH CONTRAST (INCLUDING MRCP) TECHNIQUE: Multiplanar multisequence MR imaging of the abdomen was performed both before and after the administration of intravenous contrast. Heavily T2-weighted images of the biliary and pancreatic ducts were  obtained, and three-dimensional MRCP images were rendered by post processing. CONTRAST:  8mL GADAVIST  GADOBUTROL  1 MMOL/ML IV SOLN COMPARISON:  MRI abdomen from 10/01/2023 CT scan abdomen and pelvis from 10/31/2023. FINDINGS: Lower chest: There are diffuse peripheral/subpleural opacities in the visualized lungs. Correlate with prior chest CT scan from 05/05/2022. No pleural effusion. Mildly enlarged heart size. No pericardial effusion. Hepatobiliary: The liver is normal in size. Noncirrhotic configuration. There are multiple scattered simple cysts throughout the liver with largest measuring up to 1 cm. There is a subcapsular 6 x 8 mm arterial hyperenhancing lesion in the right hepatic lobe, segment 5, which is not seen on any other sequences. This may represent benign finding such as FNH (focal nodular hyperplasia) or hepatic adenoma. No suspicious liver lesion. Redemonstration of mild intrahepatic bile duct dilation. Extrahepatic bile duct is also dilated measuring up to 1.1 cm in the proximal portion and midportion. There is smooth walled stricture over short length at the level of ampulla of Vater. The appearance is essentially similar to the prior study. There is no discrete obstructing mass or choledocholithiasis. The gallbladder is surgically absent. No abnormal collection seen in the cholecystectomy bed. Pancreas: Normal T1 hyperintense appearance of the pancreas. No focal lesion. Mildly prominent main pancreatic duct measuring up to 5-6 mm in the head region which gradually narrows measuring up to 3-4 mm at the level of ampulla of Vater. No peripancreatic fat stranding. Spleen:  Within normal limits in size and appearance. No focal mass. Adrenals/Urinary Tract: Unremarkable adrenal glands. No hydroureteronephrosis. Partially exophytic 6.9 x 7.2 cm cyst noted arising from the right kidney upper pole. No suspicious renal lesion. Stomach/Bowel: Visualized portions within the abdomen are unremarkable. No  disproportionate dilation of bowel loops. Vascular/Lymphatic: No pathologically enlarged lymph nodes identified. No abdominal aortic aneurysm demonstrated. No ascites. Other:  None. Musculoskeletal: No suspicious bone lesions identified. IMPRESSION: 1. Redemonstration of mild intrahepatic bile duct dilation and dilation of the common bile duct measuring up to 1.1 cm proximally and midportion. There is smooth walled stricture over short length at the level of Ampulla of Vater. No discrete obstructing mass or choledocholithiasis. No pancreatic head mass. 2. Multiple other nonacute observations, as described above. Electronically Signed   By: Ree Molt M.D.   On: 04/13/2024 08:45   DG ERCP Result Date: 04/03/2024 CLINICAL DATA:  Abnormal MRCP in a patient with jaundice. EXAM: ERCP 3 fluoroscopic intra procedural images of the right upper quadrant submitted for interpretation TECHNIQUE: Multiple spot images obtained with the fluoroscopic device and submitted for interpretation post-procedure. FLUOROSCOPY: Radiation Exposure Index (as provided by the fluoroscopic device): 13.8 mGy Kerma COMPARISON:  None Available. FINDINGS: Fluoroscopic images of the right upper quadrant demonstrate flexible endoscopy device with guidewire in the common bile duct. Common bile duct is mildly dilated with filling defect distally consistent with intraductal stones/sludge. Images  somewhat degraded by motion. IMPRESSION: Fluoroscopic images from ERCP procedure demonstrating common bile duct stone/sludge. These images were submitted for radiologic interpretation only. Please see the procedural report for the amount of contrast and the fluoroscopy time utilized. Electronically Signed   By: Cordella Banner   On: 04/03/2024 15:43    Orders Placed This Encounter  Procedures   CT Chest W Contrast    Standing Status:   Future    Expected Date:   04/23/2024    Expiration Date:   04/20/2025    If indicated for the ordered procedure,  I authorize the administration of contrast media per Radiology protocol:   Yes    Does the patient have a contrast media/X-ray dye allergy?:   No    Preferred imaging location?:   [facility]   CBC with Differential (Cancer Center Only)    Standing Status:   Future    Number of Occurrences:   1    Expiration Date:   04/20/2025   Basic Metabolic Panel - Cancer Center Only    Standing Status:   Future    Number of Occurrences:   1    Expiration Date:   04/20/2025   Cancer antigen 19-9    Standing Status:   Future    Number of Occurrences:   1    Expiration Date:   04/20/2025   CEA (Access)    Standing Status:   Future    Number of Occurrences:   1    Expiration Date:   04/20/2025   Hepatic function panel    Standing Status:   Future    Number of Occurrences:   1    Expiration Date:   04/20/2025   Lactate dehydrogenase    Standing Status:   Future    Number of Occurrences:   1    Expiration Date:   04/20/2025    CODE STATUS:  Code Status History     Date Active Date Inactive Code Status Order ID Comments User Context   11/01/2023 0839 11/02/2023 1827 Full Code [id number]  Perri DELENA Meliton Mickey., MD ED   11/01/2023 0039 11/01/2023 0839 Full Code [id number]  Shona Terry SAILOR, DO ED   09/30/2023 1058 10/05/2023 1847 Full Code [id number]  Zella Katha HERO, MD ED    Questions for Most Recent Historical Code Status (Order [id number])     Question Answer   By: Consent: discussion documented in EHR            Future Appointments  Date Time Provider Department Center  04/24/2024  7:00 AM [facility]     I spent a total of 70 minutes during this encounter with the patient including review of chart and various tests results, discussions about plan of care and coordination of care plan.  This document was completed utilizing speech recognition software. Grammatical errors, random word insertions, pronoun errors, and incomplete sentences are an occasional  consequence of this system due to software limitations, ambient noise, and hardware issues. Any formal questions or concerns about the content, text or information contained within the body of this dictation should be directly addressed to the provider for clarification.

## 2024-04-21 LAB — CANCER ANTIGEN 19-9: CA 19-9: 102 U/mL — ABNORMAL HIGH (ref 0–35)

## 2024-04-23 ENCOUNTER — Encounter: Payer: Self-pay | Admitting: Gastroenterology

## 2024-04-23 DIAGNOSIS — H401112 Primary open-angle glaucoma, right eye, moderate stage: Secondary | ICD-10-CM | POA: Diagnosis not present

## 2024-04-24 ENCOUNTER — Inpatient Hospital Stay

## 2024-04-24 ENCOUNTER — Ambulatory Visit (HOSPITAL_COMMUNITY)
Admission: RE | Admit: 2024-04-24 | Discharge: 2024-04-24 | Disposition: A | Discharge status: Home / Self Care | Source: Ambulatory Visit | Attending: Oncology | Admitting: Oncology

## 2024-04-24 ENCOUNTER — Other Ambulatory Visit: Payer: Self-pay | Admitting: General Surgery

## 2024-04-24 ENCOUNTER — Encounter: Payer: Self-pay | Admitting: Oncology

## 2024-04-24 DIAGNOSIS — C249 Malignant neoplasm of biliary tract, unspecified: Secondary | ICD-10-CM | POA: Diagnosis not present

## 2024-04-24 DIAGNOSIS — K831 Obstruction of bile duct: Secondary | ICD-10-CM | POA: Diagnosis not present

## 2024-04-24 DIAGNOSIS — R739 Hyperglycemia, unspecified: Secondary | ICD-10-CM

## 2024-04-24 DIAGNOSIS — R918 Other nonspecific abnormal finding of lung field: Secondary | ICD-10-CM | POA: Diagnosis not present

## 2024-04-24 DIAGNOSIS — C221 Intrahepatic bile duct carcinoma: Secondary | ICD-10-CM

## 2024-04-24 DIAGNOSIS — Z8619 Personal history of other infectious and parasitic diseases: Secondary | ICD-10-CM | POA: Diagnosis not present

## 2024-04-24 DIAGNOSIS — K7400 Hepatic fibrosis, unspecified: Secondary | ICD-10-CM | POA: Diagnosis not present

## 2024-04-24 MED ORDER — IOHEXOL 300 MG/ML  SOLN
75.0000 mL | Freq: Once | INTRAMUSCULAR | Status: AC | PRN
  Administered 2024-04-24: 75 mL via INTRAVENOUS

## 2024-04-24 NOTE — Progress Notes (Signed)
 [facility] Clinical Social Work  Clinical Social Work was referred by Statistician for assessment of psychosocial needs.  Clinical Social Worker contacted patient by phone to offer support and assess for needs.  Patient / Spouse available for call. CSW introduced herself and explained role on support team. Patient's current treatment plan is surgical resection, no other treatment planned at this time. Patient / Spouse primary concern was for medical bills post surgery, patient dual insured. Patient / Spouse has contact information for questions about assistance once statement is received. Patient / Spouse inquired about additional supports available, they are interested in meeting with a Dietician and Counseling post surgery.   Follow Up Plan:  CSW will continue to monitor for surgery date. Patient / Spouse has direct number as well.     Lizbeth Sprague, LCSW  Clinical Social Worker [facility]

## 2024-04-24 NOTE — Assessment & Plan Note (Signed)
 Please review oncology history for additional details and timeline of events.  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  He had a biliary stent in place which was removed around that time.  On 04/12/2024, MRI abdomen/MRCP showed mild intrahepatic bile duct dilatation and dilation of the common bile duct measuring up to 1.1 cm proximally and midportion.  There was smooth walled stricture over short length at the level of ampulla of Vader.  No discrete obstructing mass or choledocholithiasis.  No pancreatic head mass.  Discussed findings so far with the patient and his family members who were accompanying.  Reviewed NCCN guidelines and discussed management options.  We will obtain CT of the chest for staging and to rule out any evidence of metastatic disease.  On MRI of the abdomen, there was no evidence of lymphadenopathy or other evidence of disease.  Likely early stage disease.  Surgical resection is the preferred treatment option, as chemotherapy is not typically beneficial preoperatively for this type of cancer.  He has an upcoming appointment with Dr. [name] for surgical consultation.  Plan to discuss his case in GI multidisciplinary tumor conference.  Additional plan of care will be decided following his surgical evaluation.

## 2024-04-25 ENCOUNTER — Other Ambulatory Visit: Payer: Self-pay | Admitting: *Deleted

## 2024-04-25 ENCOUNTER — Ambulatory Visit (HOSPITAL_COMMUNITY): Admit: 2024-04-25 | Admitting: Surgery

## 2024-04-25 SURGERY — REPAIR, HERNIA, INGUINAL, ADULT
Anesthesia: General | Laterality: Left

## 2024-04-25 NOTE — Progress Notes (Signed)
 The proposed treatment discussed in conference is for discussion purpose only and is not a binding recommendation.  The patients have not been physically examined, or presented with their treatment options.  Therefore, final treatment plans cannot be decided.

## 2024-04-26 DIAGNOSIS — Z23 Encounter for immunization: Secondary | ICD-10-CM | POA: Diagnosis not present

## 2024-05-08 ENCOUNTER — Telehealth: Payer: Self-pay

## 2024-05-08 NOTE — Telephone Encounter (Signed)
 Opened in error

## 2024-05-10 ENCOUNTER — Telehealth: Payer: Self-pay | Admitting: Oncology

## 2024-05-10 NOTE — Telephone Encounter (Signed)
 I reached out to patient to schedule for lab and OV with Dr. Autumn for December 3rd or 10th per staff message. Patient's spouse stated she would give me a call back to schedule after she speaks with the surgeon the patient will be having procedure with in November.

## 2024-05-16 ENCOUNTER — Emergency Department (HOSPITAL_COMMUNITY)

## 2024-05-16 ENCOUNTER — Inpatient Hospital Stay (HOSPITAL_COMMUNITY)
Admission: EM | Admit: 2024-05-16 | Discharge: 2024-05-22 | DRG: 444 | Disposition: A | Discharge status: Home / Self Care | Attending: Internal Medicine | Admitting: Internal Medicine

## 2024-05-16 DIAGNOSIS — Z7982 Long term (current) use of aspirin: Secondary | ICD-10-CM | POA: Diagnosis not present

## 2024-05-16 DIAGNOSIS — R531 Weakness: Secondary | ICD-10-CM

## 2024-05-16 DIAGNOSIS — K5710 Diverticulosis of small intestine without perforation or abscess without bleeding: Secondary | ICD-10-CM | POA: Diagnosis present

## 2024-05-16 DIAGNOSIS — R7989 Other specified abnormal findings of blood chemistry: Secondary | ICD-10-CM | POA: Diagnosis present

## 2024-05-16 DIAGNOSIS — K219 Gastro-esophageal reflux disease without esophagitis: Secondary | ICD-10-CM | POA: Diagnosis present

## 2024-05-16 DIAGNOSIS — D72829 Elevated white blood cell count, unspecified: Secondary | ICD-10-CM | POA: Diagnosis not present

## 2024-05-16 DIAGNOSIS — J941 Fibrothorax: Secondary | ICD-10-CM | POA: Diagnosis not present

## 2024-05-16 DIAGNOSIS — I10 Essential (primary) hypertension: Secondary | ICD-10-CM | POA: Diagnosis present

## 2024-05-16 DIAGNOSIS — Z9049 Acquired absence of other specified parts of digestive tract: Secondary | ICD-10-CM

## 2024-05-16 DIAGNOSIS — Z87891 Personal history of nicotine dependence: Secondary | ICD-10-CM

## 2024-05-16 DIAGNOSIS — H409 Unspecified glaucoma: Secondary | ICD-10-CM | POA: Diagnosis not present

## 2024-05-16 DIAGNOSIS — R7881 Bacteremia: Secondary | ICD-10-CM | POA: Diagnosis present

## 2024-05-16 DIAGNOSIS — Z885 Allergy status to narcotic agent status: Secondary | ICD-10-CM | POA: Diagnosis not present

## 2024-05-16 DIAGNOSIS — N178 Other acute kidney failure: Secondary | ICD-10-CM | POA: Diagnosis not present

## 2024-05-16 DIAGNOSIS — E8809 Other disorders of plasma-protein metabolism, not elsewhere classified: Secondary | ICD-10-CM | POA: Diagnosis not present

## 2024-05-16 DIAGNOSIS — R932 Abnormal findings on diagnostic imaging of liver and biliary tract: Secondary | ICD-10-CM | POA: Diagnosis not present

## 2024-05-16 DIAGNOSIS — D630 Anemia in neoplastic disease: Secondary | ICD-10-CM | POA: Diagnosis present

## 2024-05-16 DIAGNOSIS — C248 Malignant neoplasm of overlapping sites of biliary tract: Secondary | ICD-10-CM | POA: Diagnosis not present

## 2024-05-16 DIAGNOSIS — E860 Dehydration: Secondary | ICD-10-CM | POA: Diagnosis not present

## 2024-05-16 DIAGNOSIS — K831 Obstruction of bile duct: Secondary | ICD-10-CM | POA: Diagnosis not present

## 2024-05-16 DIAGNOSIS — K838 Other specified diseases of biliary tract: Secondary | ICD-10-CM | POA: Diagnosis not present

## 2024-05-16 DIAGNOSIS — N281 Cyst of kidney, acquired: Secondary | ICD-10-CM | POA: Diagnosis not present

## 2024-05-16 DIAGNOSIS — Z79899 Other long term (current) drug therapy: Secondary | ICD-10-CM

## 2024-05-16 DIAGNOSIS — D75838 Other thrombocytosis: Secondary | ICD-10-CM | POA: Diagnosis present

## 2024-05-16 DIAGNOSIS — K8031 Calculus of bile duct with cholangitis, unspecified, with obstruction: Secondary | ICD-10-CM | POA: Diagnosis not present

## 2024-05-16 DIAGNOSIS — J479 Bronchiectasis, uncomplicated: Secondary | ICD-10-CM | POA: Diagnosis not present

## 2024-05-16 DIAGNOSIS — N179 Acute kidney failure, unspecified: Secondary | ICD-10-CM | POA: Diagnosis not present

## 2024-05-16 DIAGNOSIS — C221 Intrahepatic bile duct carcinoma: Secondary | ICD-10-CM | POA: Diagnosis not present

## 2024-05-16 DIAGNOSIS — I959 Hypotension, unspecified: Secondary | ICD-10-CM | POA: Diagnosis present

## 2024-05-16 DIAGNOSIS — Z8619 Personal history of other infectious and parasitic diseases: Secondary | ICD-10-CM | POA: Diagnosis not present

## 2024-05-16 DIAGNOSIS — R5383 Other fatigue: Secondary | ICD-10-CM | POA: Diagnosis present

## 2024-05-16 DIAGNOSIS — C229 Malignant neoplasm of liver, not specified as primary or secondary: Secondary | ICD-10-CM | POA: Diagnosis not present

## 2024-05-16 DIAGNOSIS — R945 Abnormal results of liver function studies: Secondary | ICD-10-CM | POA: Diagnosis not present

## 2024-05-16 DIAGNOSIS — C249 Malignant neoplasm of biliary tract, unspecified: Secondary | ICD-10-CM | POA: Diagnosis not present

## 2024-05-16 DIAGNOSIS — B952 Enterococcus as the cause of diseases classified elsewhere: Secondary | ICD-10-CM | POA: Diagnosis not present

## 2024-05-16 DIAGNOSIS — Z8509 Personal history of malignant neoplasm of other digestive organs: Secondary | ICD-10-CM | POA: Diagnosis not present

## 2024-05-16 DIAGNOSIS — K8309 Other cholangitis: Secondary | ICD-10-CM | POA: Diagnosis not present

## 2024-05-16 DIAGNOSIS — K5900 Constipation, unspecified: Secondary | ICD-10-CM | POA: Diagnosis not present

## 2024-05-16 DIAGNOSIS — R933 Abnormal findings on diagnostic imaging of other parts of digestive tract: Secondary | ICD-10-CM | POA: Diagnosis not present

## 2024-05-16 DIAGNOSIS — R7401 Elevation of levels of liver transaminase levels: Secondary | ICD-10-CM | POA: Diagnosis present

## 2024-05-16 DIAGNOSIS — B965 Pseudomonas (aeruginosa) (mallei) (pseudomallei) as the cause of diseases classified elsewhere: Secondary | ICD-10-CM | POA: Diagnosis not present

## 2024-05-16 DIAGNOSIS — E876 Hypokalemia: Secondary | ICD-10-CM | POA: Diagnosis present

## 2024-05-16 DIAGNOSIS — L600 Ingrowing nail: Secondary | ICD-10-CM | POA: Diagnosis present

## 2024-05-16 DIAGNOSIS — E43 Unspecified severe protein-calorie malnutrition: Secondary | ICD-10-CM | POA: Diagnosis present

## 2024-05-16 DIAGNOSIS — K5730 Diverticulosis of large intestine without perforation or abscess without bleeding: Secondary | ICD-10-CM | POA: Diagnosis not present

## 2024-05-16 DIAGNOSIS — R17 Unspecified jaundice: Secondary | ICD-10-CM | POA: Diagnosis not present

## 2024-05-16 DIAGNOSIS — R109 Unspecified abdominal pain: Secondary | ICD-10-CM | POA: Diagnosis not present

## 2024-05-16 DIAGNOSIS — J8410 Pulmonary fibrosis, unspecified: Secondary | ICD-10-CM | POA: Diagnosis not present

## 2024-05-16 DIAGNOSIS — N400 Enlarged prostate without lower urinary tract symptoms: Secondary | ICD-10-CM | POA: Diagnosis not present

## 2024-05-16 DIAGNOSIS — R55 Syncope and collapse: Secondary | ICD-10-CM | POA: Diagnosis not present

## 2024-05-16 DIAGNOSIS — Z6822 Body mass index (BMI) 22.0-22.9, adult: Secondary | ICD-10-CM

## 2024-05-16 DIAGNOSIS — R8271 Bacteriuria: Secondary | ICD-10-CM | POA: Diagnosis not present

## 2024-05-16 LAB — I-STAT CHEM 8, ED
BUN: 48 mg/dL — ABNORMAL HIGH (ref 8–23)
Calcium, Ion: 1.03 mmol/L — ABNORMAL LOW (ref 1.15–1.40)
Chloride: 101 mmol/L (ref 98–111)
Creatinine, Ser: 3.3 mg/dL — ABNORMAL HIGH (ref 0.61–1.24)
Glucose, Bld: 113 mg/dL — ABNORMAL HIGH (ref 70–99)
HCT: 28 % — ABNORMAL LOW (ref 39.0–52.0)
Hemoglobin: 9.5 g/dL — ABNORMAL LOW (ref 13.0–17.0)
Potassium: 2.6 mmol/L — CL (ref 3.5–5.1)
Sodium: 140 mmol/L (ref 135–145)
TCO2: 22 mmol/L (ref 22–32)

## 2024-05-16 LAB — COMPREHENSIVE METABOLIC PANEL WITH GFR
ALT: 59 U/L — ABNORMAL HIGH (ref 0–44)
AST: 68 U/L — ABNORMAL HIGH (ref 15–41)
Albumin: 1.9 g/dL — ABNORMAL LOW (ref 3.5–5.0)
Alkaline Phosphatase: 312 U/L — ABNORMAL HIGH (ref 38–126)
Anion gap: 15 (ref 5–15)
BUN: 64 mg/dL — ABNORMAL HIGH (ref 8–23)
CO2: 23 mmol/L (ref 22–32)
Calcium: 8.4 mg/dL — ABNORMAL LOW (ref 8.9–10.3)
Chloride: 97 mmol/L — ABNORMAL LOW (ref 98–111)
Creatinine, Ser: 3.7 mg/dL — ABNORMAL HIGH (ref 0.61–1.24)
GFR, Estimated: 16 mL/min — ABNORMAL LOW (ref >60)
Glucose, Bld: 148 mg/dL — ABNORMAL HIGH (ref 70–99)
Potassium: 2.8 mmol/L — ABNORMAL LOW (ref 3.5–5.1)
Sodium: 135 mmol/L (ref 135–145)
Total Bilirubin: 19.6 mg/dL (ref 0.0–1.2)
Total Protein: 6.6 g/dL (ref 6.5–8.1)

## 2024-05-16 LAB — CBC WITH DIFFERENTIAL/PLATELET
Abs Immature Granulocytes: 0.08 K/uL — ABNORMAL HIGH (ref 0.00–0.07)
Basophils Absolute: 0 K/uL (ref 0.0–0.1)
Basophils Relative: 0 %
Eosinophils Absolute: 0 K/uL (ref 0.0–0.5)
Eosinophils Relative: 0 %
HCT: 31 % — ABNORMAL LOW (ref 39.0–52.0)
Hemoglobin: 10.7 g/dL — ABNORMAL LOW (ref 13.0–17.0)
Immature Granulocytes: 1 %
Lymphocytes Relative: 8 %
Lymphs Abs: 1.2 K/uL (ref 0.7–4.0)
MCH: 28.2 pg (ref 26.0–34.0)
MCHC: 34.5 g/dL (ref 30.0–36.0)
MCV: 81.8 fL (ref 80.0–100.0)
Monocytes Absolute: 1.1 K/uL — ABNORMAL HIGH (ref 0.1–1.0)
Monocytes Relative: 7 %
Neutro Abs: 12.7 K/uL — ABNORMAL HIGH (ref 1.7–7.7)
Neutrophils Relative %: 84 %
Platelets: 495 K/uL — ABNORMAL HIGH (ref 150–400)
RBC: 3.79 MIL/uL — ABNORMAL LOW (ref 4.22–5.81)
RDW: 15.2 % (ref 11.5–15.5)
WBC: 15.1 K/uL — ABNORMAL HIGH (ref 4.0–10.5)
nRBC: 0 % (ref 0.0–0.2)

## 2024-05-16 LAB — URINALYSIS, W/ REFLEX TO CULTURE (INFECTION SUSPECTED)
Glucose, UA: NEGATIVE mg/dL
Hgb urine dipstick: NEGATIVE
Ketones, ur: NEGATIVE mg/dL
Leukocytes,Ua: NEGATIVE
Nitrite: NEGATIVE
Protein, ur: NEGATIVE mg/dL
Specific Gravity, Urine: 1.01 (ref 1.005–1.030)
pH: 5 (ref 5.0–8.0)

## 2024-05-16 LAB — LIPASE, BLOOD: Lipase: 18 U/L (ref 11–51)

## 2024-05-16 LAB — I-STAT CG4 LACTIC ACID, ED: Lactic Acid, Venous: 0.9 mmol/L (ref 0.5–1.9)

## 2024-05-16 LAB — BRAIN NATRIURETIC PEPTIDE: B Natriuretic Peptide: 46.3 pg/mL (ref 0.0–100.0)

## 2024-05-16 MED ORDER — MELATONIN 3 MG PO TABS
3.0000 mg | ORAL_TABLET | Freq: Every day | ORAL | Status: DC
  Administered 2024-05-17 – 2024-05-21 (×5): 3 mg via ORAL
  Filled 2024-05-16 (×5): qty 1

## 2024-05-16 MED ORDER — MELATONIN 3 MG PO TABS
9.0000 mg | ORAL_TABLET | Freq: Once | ORAL | Status: AC
  Administered 2024-05-16: 9 mg via ORAL
  Filled 2024-05-16: qty 3

## 2024-05-16 MED ORDER — SODIUM CHLORIDE 0.9 % IV SOLN: INTRAVENOUS | Status: AC

## 2024-05-16 MED ORDER — SODIUM CHLORIDE 0.9 % IV BOLUS
1000.0000 mL | Freq: Once | INTRAVENOUS | Status: AC
  Administered 2024-05-16: 1000 mL via INTRAVENOUS

## 2024-05-16 MED ORDER — POTASSIUM CHLORIDE CRYS ER 20 MEQ PO TBCR
60.0000 meq | EXTENDED_RELEASE_TABLET | Freq: Once | ORAL | Status: AC
  Administered 2024-05-16: 60 meq via ORAL
  Filled 2024-05-16: qty 3

## 2024-05-16 MED ORDER — DORZOLAMIDE HCL-TIMOLOL MAL 2-0.5 % OP SOLN
1.0000 [drp] | Freq: Two times a day (BID) | OPHTHALMIC | Status: DC
  Administered 2024-05-17: 1 [drp] via OPHTHALMIC
  Filled 2024-05-16: qty 10

## 2024-05-16 MED ORDER — HEPARIN SODIUM (PORCINE) 5000 UNIT/ML IJ SOLN
5000.0000 [IU] | Freq: Three times a day (TID) | INTRAMUSCULAR | Status: DC
  Administered 2024-05-16 – 2024-05-22 (×17): 5000 [IU] via SUBCUTANEOUS
  Filled 2024-05-16 (×17): qty 1

## 2024-05-16 MED ORDER — LATANOPROST 0.005 % OP SOLN
1.0000 [drp] | Freq: Every day | OPHTHALMIC | Status: DC
  Administered 2024-05-17 – 2024-05-21 (×5): 1 [drp] via OPHTHALMIC
  Filled 2024-05-16: qty 2.5

## 2024-05-16 NOTE — ED Triage Notes (Signed)
 Pt BIB [facility] coming from a nail salon c/o passing out while getting a pedicure lasting approximately 30seconds. Pt was hypotensive with a systolic in the 80s that improved to the 90s with LR. Pt recently had a cholysysectomy and placed a stent. Pt also recently diagnosed with cancer and has a procedure on Thursday. Pt reporting very poor PO intake x months and EMS reports pt being very jaundiced. Pt alert and in no acute distress upon arrival.   HR 88 97% RA RR 18 BP 90/50 [id number]

## 2024-05-16 NOTE — H&P (Signed)
 History and Physical    Andrew Douglas FMW:[id number] DOB: 1946/03/11 DOA: 05/16/2024  PCP: [name], PA  Patient coming from: nail salon  I have personally briefly reviewed patient's old medical records in [facility]  Chief Complaint: syncope  HPI: Andrew Douglas is a 78 y.o. 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 Patient BIB EMS from nail salon after he has episode of syncope. Per patient while getting a pedicure he got stuck in a sensitive area and then became nauseated and passed out. In the field patient was noted to be hypotensive to systolic of 80's that improved systolic of 90's s/p LR bolus. Of note patient states over the last few months he has had poor appetite and decrease po in setting of recent diagnosis of cholangiocarcinoma. He notes no current n/v/d/sob/chest pain / or abdominal pain. He states he feels back to his baseline and is actually hunger currently.   ED Course:  ON arrival to ED vitals : Afeb bp 89/59, hr 91, rr 10 , sat 100%  EKG: nsr 88 no hyperacute st-twave changes  Wbc 15.1 (7.8), hgb 10.7 repeat after fluids  9.5 Cr 3.70, Albumin 1.9, AST 68, ALT 59  Alphos 312, GRF 16 K2.8 tbili 19.6 IMPRESSION: 1. No acute cardiopulmonary process. 2. 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 Review of Systems: As per HPI otherwise 10 point review of systems negative.   Past Medical History:  Diagnosis Date   Hepatitis C    Hypertension     Past Surgical History:  Procedure Laterality Date   BACK SURGERY     a little back problem 20 or 25 years ago   BILIARY DILATION  10/04/2023   Procedure: DILATION, STRICTURE, BILE DUCT;  Surgeon: [name] Kitchens, MD;  Location: [facility];  Service: Gastroenterology;;   CHOLECYSTECTOMY N/A 10/03/2023    Procedure: LAPAROSCOPIC CHOLECYSTECTOMY WITH ICG DYE AND  INTRAOPERATIVE CHOLANGIOGRAM;  Surgeon: Vanderbilt Ned, MD;  Location: [facility];  Service: General;  Laterality: N/A;  ICG   ERCP N/A 10/04/2023   Procedure: ERCP, WITH INTERVENTION IF INDICATED;  Surgeon: [name] Kitchens, MD;  Location: [facility];  Service: Gastroenterology;  Laterality: N/A;   ERCP N/A 11/01/2023   Procedure: ERCP, WITH INTERVENTION IF INDICATED;  Surgeon: [name] Kitchens, MD;  Location: [facility];  Service: Gastroenterology;  Laterality: N/A;   ERCP N/A 04/03/2024   Procedure: ERCP, WITH INTERVENTION IF INDICATED;  Surgeon: [name] Kitchens, MD;  Location: [facility];  Service: Gastroenterology;  Laterality: N/A;  stent removal   ESOPHAGOGASTRODUODENOSCOPY N/A 10/02/2023   Procedure: EGD (ESOPHAGOGASTRODUODENOSCOPY);  Surgeon: Avram Lupita BRAVO, MD;  Location: [facility];  Service: Gastroenterology;  Laterality: N/A;   SPHINCTEROTOMY  10/04/2023   Procedure: SPHINCTEROTOMY, BILIARY;  Surgeon: [name] Kitchens, MD;  Location: [facility];  Service: Gastroenterology;;     reports that he has quit smoking. His smoking use included cigarettes. He has been exposed to tobacco smoke. He has never used smokeless tobacco. He reports that he does not currently use alcohol. He reports that he does not currently use drugs.  Allergies  Allergen Reactions   Oxycontin  [Oxycodone ]     Crazy dreams    No family history on file.  Throat cancer  Breast cancer  Prostate cancer Prior to Admission medications   Medication Sig Start Date End Date Taking? Authorizing Provider  ascorbic acid (  VITAMIN C) 500 MG tablet Take 500 mg by mouth daily.    [provider]  aspirin EC 81 MG tablet Take 81 mg by mouth daily. Swallow whole.    [provider]  benzonatate  (TESSALON ) 200 MG capsule Take 200 mg by mouth 3 (three) times daily as needed for cough.    [provider]  dorzolamide -timolol  (COSOPT ) 2-0.5 % ophthalmic  solution Place 1 drop into both eyes 2 (two) times daily.    [provider]  losartan-hydrochlorothiazide (HYZAAR) 100-12.5 MG tablet Take 1 tablet by mouth daily.    [provider]  Multiple Vitamin (MULTIVITAMIN WITH MINERALS) TABS tablet Take 1 tablet by mouth daily.    [provider]  omeprazole  (PRILOSEC OTC) 20 MG tablet Take 20 mg by mouth daily. 09/30/23   [provider]  polyethylene glycol powder (GLYCOLAX /MIRALAX ) 17 GM/SCOOP powder Take 17 g by mouth daily as needed for mild constipation or moderate constipation. Mix in 4-8 ounces of liquid as directed. 11/02/23   Amin, Ankit C, MD  Red Yeast Rice Extract (RED YEAST RICE PO) Take 1 capsule by mouth daily at 12 noon.    [provider]  Travoprost, BAK Free, (TRAVATAN) 0.004 % SOLN ophthalmic solution Place 1 drop into both eyes at bedtime. 09/10/20   [provider]    Physical Exam: Vitals:   05/16/24 1700 05/16/24 1703 05/16/24 1800 05/16/24 1830  BP: 104/67  96/63 101/63  Pulse:  89 89 88  Resp: 18  13 18   Temp:      TempSrc:      SpO2:   100% 100%  Weight:      Height:        Constitutional: NAD, calm, comfortable Vitals:   05/16/24 1700 05/16/24 1703 05/16/24 1800 05/16/24 1830  BP: 104/67  96/63 101/63  Pulse:  89 89 88  Resp: 18  13 18   Temp:      TempSrc:      SpO2:   100% 100%  Weight:      Height:       Eyes: PERRL, lids and conjunctivae normal ENMT: Mucous membranes are moist. Posterior pharynx clear of any exudate or lesions.Normal dentition.  Neck: normal, supple, no masses, no thyromegaly Respiratory: clear to auscultation bilaterally, no wheezing, no crackles. Normal respiratory effort. No accessory muscle use.  Cardiovascular: Regular rate and rhythm, no murmurs / rubs / gallops. No extremity edema. 2+ pedal pulses.  Abdomen: no tenderness, no masses palpated. No hepatosplenomegaly. Bowel sounds positive.  Musculoskeletal: no clubbing / cyanosis.  No joint deformity upper and lower extremities. Good ROM, no contractures. Normal muscle tone.  Skin: no rashes, lesions, ulcers. No induration Neurologic: CN 2-12 grossly intact. Sensation intact,  Strength 5/5 in all 4.  Psychiatric: Normal judgment and insight. Alert and oriented x 3. Normal mood.    Labs on Admission: I have personally reviewed following labs and imaging studies  CBC: Recent Labs  Lab 05/16/24 1645 05/16/24 1852  WBC 15.1*  --   NEUTROABS 12.7*  --   HGB 10.7* 9.5*  HCT 31.0* 28.0*  MCV 81.8  --   PLT 495*  --    Basic Metabolic Panel: Recent Labs  Lab 05/16/24 1645 05/16/24 1852  NA 135 140  K 2.8* 2.6*  CL 97* 101  CO2 23  --   GLUCOSE 148* 113*  BUN 64* 48*  CREATININE 3.70* 3.30*  CALCIUM 8.4*  --    GFR: Estimated Creatinine  Clearance: 19.2 mL/min (A) (by C-G formula based on SCr of 3.3 mg/dL (H)). Liver Function Tests: Recent Labs  Lab 05/16/24 1645  AST 68*  ALT 59*  ALKPHOS 312*  BILITOT 19.6*  PROT 6.6  ALBUMIN 1.9*   Recent Labs  Lab 05/16/24 1645  LIPASE 18   No results for input(s): AMMONIA in the last 168 hours. Coagulation Profile: No results for input(s): INR, PROTIME in the last 168 hours. Cardiac Enzymes: No results for input(s): CKTOTAL, CKMB, CKMBINDEX, TROPONINI in the last 168 hours. BNP (last 3 results) No results for input(s): PROBNP in the last 8760 hours. HbA1C: No results for input(s): HGBA1C in the last 72 hours. CBG: No results for input(s): GLUCAP in the last 168 hours. Lipid Profile: No results for input(s): CHOL, HDL, LDLCALC, TRIG, CHOLHDL, LDLDIRECT in the last 72 hours. Thyroid Function Tests: No results for input(s): TSH, T4TOTAL, FREET4, T3FREE, THYROIDAB in the last 72 hours. Anemia Panel: No results for input(s): VITAMINB12, FOLATE, FERRITIN, TIBC, IRON, RETICCTPCT in the last 72 hours. Urine analysis:    Component Value Date/Time    COLORURINE AMBER (A) 05/16/2024 1846   APPEARANCEUR HAZY (A) 05/16/2024 1846   LABSPEC 1.010 05/16/2024 1846   PHURINE 5.0 05/16/2024 1846   GLUCOSEU NEGATIVE 05/16/2024 1846   HGBUR NEGATIVE 05/16/2024 1846   BILIRUBINUR MODERATE (A) 05/16/2024 1846   KETONESUR NEGATIVE 05/16/2024 1846   PROTEINUR NEGATIVE 05/16/2024 1846   NITRITE NEGATIVE 05/16/2024 1846   LEUKOCYTESUR NEGATIVE 05/16/2024 1846    Radiological Exams on Admission: CT ABDOMEN PELVIS WO CONTRAST Result Date: 05/16/2024 CLINICAL DATA:  Abdominal pain EXAM: CT ABDOMEN AND PELVIS WITHOUT CONTRAST TECHNIQUE: Multidetector CT imaging of the abdomen and pelvis was performed following the standard protocol without IV contrast. RADIATION DOSE REDUCTION: This exam was performed according to the departmental dose-optimization program which includes automated exposure control, adjustment of the mA and/or kV according to patient size and/or use of iterative reconstruction technique. COMPARISON:  Chest CT 04/24/2024, MRI 04/12/2024, CT 10/31/2023 FINDINGS: Lower chest: Lung bases demonstrate chronic interstitial lung disease. No acute airspace disease. Hepatobiliary: Cholecystectomy. Moderate intra and extrahepatic biliary dilatation, common bile duct diameter measures 18 mm and may be slightly increased compared to prior. Pancreas: Unremarkable. No pancreatic ductal dilatation or surrounding inflammatory changes. Spleen: Normal in size without focal abnormality. Adrenals/Urinary Tract: Adrenal glands are normal. Right renal cyst for which no imaging follow-up is recommended. No hydronephrosis. The bladder is unremarkable. Stomach/Bowel: Stomach nonenlarged. No dilated small bowel. No acute bowel wall thickening. Diverticular disease of the colon. Vascular/Lymphatic: Aortic atherosclerosis. No enlarged abdominal or pelvic lymph nodes. Reproductive: Slightly enlarged prostate Other: Negative for ascites or free air. Small fat containing inguinal  hernias. Musculoskeletal: No acute osseous abnormality IMPRESSION: 1. No CT evidence for acute intra-abdominal or pelvic abnormality. 2. Chronic interstitial lung disease at the bases. 3. Status post cholecystectomy. Moderate intra and extrahepatic biliary dilatation, common bile duct diameter measures 18 mm and appears slightly increased compared to prior exams. 4. Diverticular disease of the colon without acute inflammatory process. 5. Aortic atherosclerosis. Aortic Atherosclerosis (ICD10-I70.0). Electronically Signed   By: Luke Bun M.D.   On: 05/16/2024 20:07   DG Chest Portable 1 View Result Date: 05/16/2024 EXAM: 1 VIEW(S) XRAY OF THE CHEST 05/16/2024 04:57:00 PM COMPARISON: Chest CT dated 04/24/2024. CLINICAL HISTORY: syncope FINDINGS: LUNGS AND PLEURA: Chronic lung changes with pulmonary fibrosis and bronchiectasis are noted on the comparison chest CT dated 04/24/2024. No acute overlying pulmonary process. No pulmonary  edema. No pleural effusion. No pneumothorax. HEART AND MEDIASTINUM: No acute abnormality of the cardiac and mediastinal silhouettes. BONES AND SOFT TISSUES: No acute osseous abnormality. IMPRESSION: 1. No acute cardiopulmonary process. 2. 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 Electronically signed by: Maude Stammer MD 05/16/2024 05:10 PM EST RP Workstation: [id number]    EKG: Independently reviewed. 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  -s/p stent in the past  due to obstruction  - now with plans for whipple 11/13   Leukocytosis  -presumed related to cancer  -cxr negative  -UA rare bacteria , f/u with urine culture  -check inflammatory markers -will monitor labs and fever  curve  Hypertension  -due to low bp holding hypertensive medications   GERD -ppi   Glaucoma  -resume drops   DVT prophylaxis:heparin   Code Status: full/ as discussed per patient wishes in event of cardiac arrest  Family Communication: Wife at bedside updated re plans and in agreement with plans and noted her understanding Andrew Douglas,Andrew Douglas (Spouse) [phone] (Mobile)  Disposition Plan: a patient  expected to be admitted greater than 2 midnights  Consults called: n/a Admission status: med tele   Camila DELENA Ned MD [facility]   If 7PM-7AM, please contact night-coverage [email or web address] Password [facility]  05/16/2024, 8:29 PM

## 2024-05-16 NOTE — ED Provider Notes (Signed)
 [place] [facility] Provider Note  CSN: [id number] Arrival date & time: 05/16/24 1625  Chief Complaint(s) Loss of Consciousness  HPI Andrew Douglas is a 78 y.o. male history of biliary carcinoma, pending upcoming Whipple surgery, jaundice presenting with episode of syncope.  The patient reports that he was getting his toenails done.  He reports that he had a sore area on his toe previously, from a prior nail treatment, the technician poked him in the toe and then he developed nausea, lightheadedness and lost consciousness.  He reports that when he woke up he felt weak.  He was unconscious for approximately 30 seconds.  Was initially hypotensive and received fluids.  He reports after receiving fluids he is feeling much better, possibly better than he has recently as he has not been able to eat or drink much.  Denies any abdominal pain, chest pain, shortness of breath, fevers or chills, vomiting, back pain, lightheadedness or dizziness, palpitations, leg swelling.  Has surgery scheduled for later this month.  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 Authorizing Provider  ascorbic acid (VITAMIN C) 500 MG tablet Take 500 mg by mouth daily.    [provider]  aspirin EC 81 MG tablet Take 81 mg by mouth daily. Swallow whole.    [provider]  benzonatate  (TESSALON ) 200 MG capsule Take 200 mg by mouth 3 (three) times daily as needed for cough.    [provider]  dorzolamide -timolol  (COSOPT ) 2-0.5 % ophthalmic solution Place 1 drop into  both eyes 2 (two) times daily.    [provider]  losartan-hydrochlorothiazide (HYZAAR) 100-12.5 MG tablet Take 1 tablet by mouth daily.    [provider]  Multiple Vitamin (MULTIVITAMIN WITH MINERALS) TABS tablet Take 1 tablet by mouth daily.    [provider]  omeprazole  (PRILOSEC OTC) 20 MG tablet Take 20 mg by mouth daily. 09/30/23   [provider]  polyethylene glycol powder (GLYCOLAX /MIRALAX ) 17 GM/SCOOP powder Take 17 g by mouth daily as needed for mild constipation or moderate constipation. Mix in 4-8 ounces of liquid as directed. 11/02/23   Amin, Ankit C, MD  Red Yeast Rice Extract (RED YEAST RICE PO) Take 1 capsule by mouth daily at 12 noon.    [provider]  Travoprost, BAK Free, (TRAVATAN) 0.004 % SOLN ophthalmic solution Place 1 drop into both eyes at bedtime. 09/10/20   [provider]  Past Surgical History Past Surgical History:  Procedure Laterality Date   BACK SURGERY     a little back problem 20 or 25 years ago   BILIARY DILATION  10/04/2023   Procedure: DILATION, STRICTURE, BILE DUCT;  Surgeon: [name] Kitchens, MD;  Location: [facility];  Service: Gastroenterology;;   CHOLECYSTECTOMY N/A 10/03/2023   Procedure: LAPAROSCOPIC CHOLECYSTECTOMY WITH ICG DYE AND  INTRAOPERATIVE CHOLANGIOGRAM;  Surgeon: Vanderbilt Ned, MD;  Location: [facility];  Service: General;  Laterality: N/A;  ICG   ERCP N/A 10/04/2023   Procedure: ERCP, WITH INTERVENTION IF INDICATED;  Surgeon: [name] Kitchens, MD;  Location: [facility];  Service: Gastroenterology;  Laterality: N/A;   ERCP N/A 11/01/2023   Procedure: ERCP, WITH INTERVENTION IF INDICATED;  Surgeon: [name] Kitchens, MD;  Location: [facility];  Service: Gastroenterology;  Laterality: N/A;   ERCP N/A 04/03/2024   Procedure: ERCP, WITH INTERVENTION IF INDICATED;  Surgeon:  [name] Kitchens, MD;  Location: [facility];  Service: Gastroenterology;  Laterality: N/A;  stent removal   ESOPHAGOGASTRODUODENOSCOPY N/A 10/02/2023   Procedure: EGD (ESOPHAGOGASTRODUODENOSCOPY);  Surgeon: Avram Lupita BRAVO, MD;  Location: [facility];  Service: Gastroenterology;  Laterality: N/A;   SPHINCTEROTOMY  10/04/2023   Procedure: SPHINCTEROTOMY, BILIARY;  Surgeon: [name] Kitchens, MD;  Location: [facility];  Service: Gastroenterology;;   Family History No family history on file.  Social History Social History   Tobacco Use   Smoking status: Former    Current packs/day: 1.00    Types: Cigarettes    Passive exposure: Past   Smokeless tobacco: Never   Tobacco comments:    Smoked 20years-1/2-1pk daily  Vaping Use   Vaping status: Never Used  Substance Use Topics   Alcohol use: Not Currently   Drug use: Not Currently   Allergies Oxycontin  [oxycodone ]  Review of Systems Review of Systems  All other systems reviewed and are negative.   Physical Exam Vital Signs  I have reviewed the triage vital signs BP (!) 95/56   Pulse 90   Temp 98 F (36.7 C) (Oral)   Resp 19   Ht 6' (1.829 m)   Wt 74.8 kg   SpO2 96%   BMI 22.38 kg/m  Physical Exam Vitals and nursing note reviewed.  Constitutional:      General: He is not in acute distress.    Appearance: Normal appearance.  HENT:     Mouth/Throat:     Mouth: Mucous membranes are moist.  Eyes:     General: Scleral icterus present.     Conjunctiva/sclera: Conjunctivae normal.  Cardiovascular:     Rate and Rhythm: Normal rate and regular rhythm.  Pulmonary:     Effort: Pulmonary effort is normal. No respiratory distress.     Breath sounds: Normal breath sounds.  Abdominal:     General: Abdomen is flat.     Palpations: Abdomen is soft.     Tenderness: There is no abdominal tenderness.  Musculoskeletal:     Right lower leg: No edema.     Left lower leg: No edema.  Skin:    General: Skin is warm and dry.     Capillary  Refill: Capillary refill takes less than 2 seconds.     Coloration: Skin is jaundiced.  Neurological:     Mental Status: He is alert and oriented to person, place, and time. Mental status is at baseline.  Psychiatric:        Mood and Affect: Mood normal.        Behavior: Behavior  normal.     ED Results and Treatments Labs (all labs ordered are listed, but only abnormal results are displayed) Labs Reviewed  COMPREHENSIVE METABOLIC PANEL WITH GFR - Abnormal; Notable for the following components:      Result Value   Potassium 2.8 (*)    Chloride 97 (*)    Glucose, Bld 148 (*)    BUN 64 (*)    Creatinine, Ser 3.70 (*)    Calcium 8.4 (*)    Albumin 1.9 (*)    AST 68 (*)    ALT 59 (*)    Alkaline Phosphatase 312 (*)    Total Bilirubin 19.6 (*)    GFR, Estimated 16 (*)    All other components within normal limits  CBC WITH DIFFERENTIAL/PLATELET - Abnormal; Notable for the following components:   WBC 15.1 (*)    RBC 3.79 (*)    Hemoglobin 10.7 (*)    HCT 31.0 (*)    Platelets 495 (*)    Neutro Abs 12.7 (*)    Monocytes Absolute 1.1 (*)    Abs Immature Granulocytes 0.08 (*)    All other components within normal limits  URINALYSIS, W/ REFLEX TO CULTURE (INFECTION SUSPECTED) - Abnormal; Notable for the following components:   Color, Urine AMBER (*)    APPearance HAZY (*)    Bilirubin Urine MODERATE (*)    Bacteria, UA RARE (*)    All other components within normal limits  I-STAT CHEM 8, ED - Abnormal; Notable for the following components:   Potassium 2.6 (*)    BUN 48 (*)    Creatinine, Ser 3.30 (*)    Glucose, Bld 113 (*)    Calcium, Ion 1.03 (*)    Hemoglobin 9.5 (*)    HCT 28.0 (*)    All other components within normal limits  LIPASE, BLOOD  BRAIN NATRIURETIC PEPTIDE  MAGNESIUM  CBC  CREATININE, SERUM  URINALYSIS, ROUTINE W REFLEX MICROSCOPIC  CBC  COMPREHENSIVE METABOLIC PANEL WITH GFR  I-STAT CG4 LACTIC ACID, ED  I-STAT CG4 LACTIC ACID, ED                                                                                                                           Radiology CT ABDOMEN PELVIS WO CONTRAST Result Date: 05/16/2024 CLINICAL DATA:  Abdominal pain EXAM: CT ABDOMEN AND PELVIS WITHOUT CONTRAST TECHNIQUE: Multidetector CT imaging of the abdomen and pelvis was performed following the standard protocol without IV contrast. RADIATION DOSE REDUCTION: This exam was performed according to the departmental dose-optimization program which includes automated exposure control, adjustment of the mA and/or kV according to patient size and/or use of iterative reconstruction technique. COMPARISON:  Chest CT 04/24/2024, MRI 04/12/2024, CT 10/31/2023 FINDINGS: Lower chest: Lung bases demonstrate chronic interstitial lung disease. No acute airspace disease. Hepatobiliary: Cholecystectomy. Moderate intra and extrahepatic biliary dilatation, common bile duct diameter measures 18 mm and may be slightly increased compared to prior. Pancreas: Unremarkable. No pancreatic ductal  dilatation or surrounding inflammatory changes. Spleen: Normal in size without focal abnormality. Adrenals/Urinary Tract: Adrenal glands are normal. Right renal cyst for which no imaging follow-up is recommended. No hydronephrosis. The bladder is unremarkable. Stomach/Bowel: Stomach nonenlarged. No dilated small bowel. No acute bowel wall thickening. Diverticular disease of the colon. Vascular/Lymphatic: Aortic atherosclerosis. No enlarged abdominal or pelvic lymph nodes. Reproductive: Slightly enlarged prostate Other: Negative for ascites or free air. Small fat containing inguinal hernias. Musculoskeletal: No acute osseous abnormality IMPRESSION: 1. No CT evidence for acute intra-abdominal or pelvic abnormality. 2. Chronic interstitial lung disease at the bases. 3. Status post cholecystectomy. Moderate intra and extrahepatic biliary dilatation, common bile duct diameter measures 18 mm and appears slightly  increased compared to prior exams. 4. Diverticular disease of the colon without acute inflammatory process. 5. Aortic atherosclerosis. Aortic Atherosclerosis (ICD10-I70.0). Electronically Signed   By: Luke Bun M.D.   On: 05/16/2024 20:07   DG Chest Portable 1 View Result Date: 05/16/2024 EXAM: 1 VIEW(S) XRAY OF THE CHEST 05/16/2024 04:57:00 PM COMPARISON: Chest CT dated 04/24/2024. CLINICAL HISTORY: syncope FINDINGS: LUNGS AND PLEURA: Chronic lung changes with pulmonary fibrosis and bronchiectasis are noted on the comparison chest CT dated 04/24/2024. No acute overlying pulmonary process. No pulmonary edema. No pleural effusion. No pneumothorax. HEART AND MEDIASTINUM: No acute abnormality of the cardiac and mediastinal silhouettes. BONES AND SOFT TISSUES: No acute osseous abnormality. IMPRESSION: 1. No acute cardiopulmonary process. 2. 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 Electronically signed by: Maude Stammer MD 05/16/2024 05:10 PM EST RP Workstation: [id number]    Pertinent labs & imaging results that were available during my care of the patient were reviewed by me and considered in my medical decision making (see MDM for details).  Medications Ordered in ED Medications  dorzolamide -timolol  (COSOPT ) 2-0.5 % ophthalmic solution 1 drop (has no administration in time range)  latanoprost  (XALATAN ) 0.005 % ophthalmic solution 1 drop (has no administration in time range)  heparin  injection 5,000 Units (has no administration in time range)  sodium chloride  0.9 % bolus 1,000 mL (0 mLs Intravenous Stopped 05/16/24 1839)  potassium chloride  SA (KLOR-CON  M) CR tablet 60 mEq (60 mEq Oral Given 05/16/24 1854)  sodium chloride  0.9 % bolus 1,000 mL (1,000 mLs Intravenous New Bag/Given 05/16/24 2114)                                                                                                                                     Procedures .Critical  Care  Performed by: Francesca Elsie CROME, MD Authorized by: Francesca Elsie CROME, MD   Critical care provider statement:    Critical care time (minutes):  30   Critical care was necessary to treat or prevent imminent or life-threatening deterioration of the following conditions:  Dehydration   Critical care was time spent personally by me on the following activities:  Development of treatment plan with patient or surrogate, discussions  with consultants, evaluation of patient's response to treatment, examination of patient, ordering and review of laboratory studies, ordering and review of radiographic studies, ordering and performing treatments and interventions, pulse oximetry, re-evaluation of patient's condition and review of old charts   Care discussed with: admitting provider     (including critical care time)  Medical Decision Making / ED Course   MDM:  78 year old presenting to the emergency department after syncope.  The patient is overall very well-appearing, his physical examination is benign other than jaundice which is chronic.  Initial vitals with mild hypotension however has improved with fluids.  Given occurred after painful stimulus, does seem most consistent with a vasovagal episode.  He reports that he felt better than he did prior to fainting after receiving the fluids.  Will check labs to evaluate for dehydration, electrolyte abnormality.  EKG reviewed without evidence of arrhythmia.  No chest pain or shortness of breath to suggest other process such as ACS.  Chest x-ray without acute finding.  Will reassess.  The patient feels totally back to normal and vitals remain normal and labs are reassuring may be able to discharge home versus admit for observation  Clinical Course as of 05/16/24 2119  Wed May 16, 2024  2042 Workup notable for significant AKI.  Obtained additional history from wife who is with patient, reports that he said ow after being poked and then had episode  of shaking and eyes rolled back and head, no incontinence or tongue biting.  Following this he seemed to be able to respond to questions normally although was seems sleepy.  Seems more consistent with syncope given no postictal period, tongue biting or incontinence, which correlates with the patient's experience and dehydration.  Discussed with Dr. Debby who will admit patient. [WS]    Clinical Course User Index [WS] Francesca Elsie CROME, MD     Additional history obtained: -Additional history obtained from ems -External records from outside source obtained and reviewed including: Chart review including previous notes, labs, imaging, consultation notes including prior notes    Lab Tests: -I ordered, reviewed, and interpreted labs.   The pertinent results include:   Labs Reviewed  COMPREHENSIVE METABOLIC PANEL WITH GFR - Abnormal; Notable for the following components:      Result Value   Potassium 2.8 (*)    Chloride 97 (*)    Glucose, Bld 148 (*)    BUN 64 (*)    Creatinine, Ser 3.70 (*)    Calcium 8.4 (*)    Albumin 1.9 (*)    AST 68 (*)    ALT 59 (*)    Alkaline Phosphatase 312 (*)    Total Bilirubin 19.6 (*)    GFR, Estimated 16 (*)    All other components within normal limits  CBC WITH DIFFERENTIAL/PLATELET - Abnormal; Notable for the following components:   WBC 15.1 (*)    RBC 3.79 (*)    Hemoglobin 10.7 (*)    HCT 31.0 (*)    Platelets 495 (*)    Neutro Abs 12.7 (*)    Monocytes Absolute 1.1 (*)    Abs Immature Granulocytes 0.08 (*)    All other components within normal limits  URINALYSIS, W/ REFLEX TO CULTURE (INFECTION SUSPECTED) - Abnormal; Notable for the following components:   Color, Urine AMBER (*)    APPearance HAZY (*)    Bilirubin Urine MODERATE (*)    Bacteria, UA RARE (*)    All other components within normal limits  I-STAT CHEM  8, ED - Abnormal; Notable for the following components:   Potassium 2.6 (*)    BUN 48 (*)    Creatinine, Ser 3.30 (*)     Glucose, Bld 113 (*)    Calcium, Ion 1.03 (*)    Hemoglobin 9.5 (*)    HCT 28.0 (*)    All other components within normal limits  LIPASE, BLOOD  BRAIN NATRIURETIC PEPTIDE  MAGNESIUM  CBC  CREATININE, SERUM  URINALYSIS, ROUTINE W REFLEX MICROSCOPIC  CBC  COMPREHENSIVE METABOLIC PANEL WITH GFR  I-STAT CG4 LACTIC ACID, ED  I-STAT CG4 LACTIC ACID, ED    Notable for leukocytosis, AKI, hyperbilirubinemia, hypokalemia  EKG   EKG Interpretation Date/Time:  Wednesday May 16 2024 16:44:10 EST Ventricular Rate:  88 PR Interval:  205 QRS Duration:  96 QT Interval:  367 QTC Calculation: 444 R Axis:   4  Text Interpretation: Sinus rhythm Abnormal R-wave progression, early transition Confirmed by Francesca Fallow ([id number]) on 05/16/2024 5:14:27 PM         Imaging Studies ordered: I ordered imaging studies including CT abdomen On my interpretation imaging demonstrates no acute process I independently visualized and interpreted imaging. I agree with the radiologist interpretation   Medicines ordered and prescription drug management: Meds ordered this encounter  Medications   sodium chloride  0.9 % bolus 1,000 mL   potassium chloride  SA (KLOR-CON  M) CR tablet 60 mEq   sodium chloride  0.9 % bolus 1,000 mL   dorzolamide -timolol  (COSOPT ) 2-0.5 % ophthalmic solution 1 drop   latanoprost  (XALATAN ) 0.005 % ophthalmic solution 1 drop   heparin  injection 5,000 Units    -I have reviewed the patients home medicines and have made adjustments as needed   Consultations Obtained: I requested consultation with the hospitalist,  and discussed lab and imaging findings as well as pertinent plan - they recommend: admission   Cardiac Monitoring: The patient was maintained on a cardiac monitor.  I personally viewed and interpreted the cardiac monitored which showed an underlying rhythm of: NSR  Reevaluation: After the interventions noted above, I reevaluated the patient and found that their  symptoms have improved  Co morbidities that complicate the patient evaluation  Past Medical History:  Diagnosis Date   Hepatitis C    Hypertension       Dispostion: Disposition decision including need for hospitalization was considered, and patient admitted to the hospital.    Final Clinical Impression(s) / ED Diagnoses Final diagnoses:  Dehydration  Syncope and collapse     This chart was dictated using voice recognition software.  Despite best efforts to proofread,  errors can occur which can change the documentation meaning.    Francesca Fallow CROME, MD 05/16/24 2119

## 2024-05-17 ENCOUNTER — Other Ambulatory Visit: Payer: Self-pay

## 2024-05-17 ENCOUNTER — Inpatient Hospital Stay (HOSPITAL_COMMUNITY)

## 2024-05-17 ENCOUNTER — Encounter (HOSPITAL_COMMUNITY): Payer: Self-pay | Admitting: Internal Medicine

## 2024-05-17 DIAGNOSIS — R55 Syncope and collapse: Secondary | ICD-10-CM | POA: Diagnosis not present

## 2024-05-17 LAB — CBC
HCT: 24.7 % — ABNORMAL LOW (ref 39.0–52.0)
Hemoglobin: 8.8 g/dL — ABNORMAL LOW (ref 13.0–17.0)
MCH: 28.5 pg (ref 26.0–34.0)
MCHC: 35.6 g/dL (ref 30.0–36.0)
MCV: 79.9 fL — ABNORMAL LOW (ref 80.0–100.0)
Platelets: 438 K/uL — ABNORMAL HIGH (ref 150–400)
RBC: 3.09 MIL/uL — ABNORMAL LOW (ref 4.22–5.81)
RDW: 14.9 % (ref 11.5–15.5)
WBC: 13.4 K/uL — ABNORMAL HIGH (ref 4.0–10.5)
nRBC: 0 % (ref 0.0–0.2)

## 2024-05-17 LAB — COMPREHENSIVE METABOLIC PANEL WITH GFR
ALT: 52 U/L — ABNORMAL HIGH (ref 0–44)
AST: 53 U/L — ABNORMAL HIGH (ref 15–41)
Albumin: <1.5 g/dL — ABNORMAL LOW (ref 3.5–5.0)
Alkaline Phosphatase: 266 U/L — ABNORMAL HIGH (ref 38–126)
Anion gap: 10 (ref 5–15)
BUN: 56 mg/dL — ABNORMAL HIGH (ref 8–23)
CO2: 24 mmol/L (ref 22–32)
Calcium: 7.8 mg/dL — ABNORMAL LOW (ref 8.9–10.3)
Chloride: 103 mmol/L (ref 98–111)
Creatinine, Ser: 3.2 mg/dL — ABNORMAL HIGH (ref 0.61–1.24)
GFR, Estimated: 19 mL/min — ABNORMAL LOW (ref >60)
Glucose, Bld: 115 mg/dL — ABNORMAL HIGH (ref 70–99)
Potassium: 3.1 mmol/L — ABNORMAL LOW (ref 3.5–5.1)
Sodium: 137 mmol/L (ref 135–145)
Total Bilirubin: 17.6 mg/dL — ABNORMAL HIGH (ref 0.0–1.2)
Total Protein: 6.1 g/dL — ABNORMAL LOW (ref 6.5–8.1)

## 2024-05-17 LAB — URINALYSIS, ROUTINE W REFLEX MICROSCOPIC
Glucose, UA: NEGATIVE mg/dL
Hgb urine dipstick: NEGATIVE
Ketones, ur: NEGATIVE mg/dL
Leukocytes,Ua: NEGATIVE
Nitrite: NEGATIVE
Protein, ur: NEGATIVE mg/dL
Specific Gravity, Urine: 1.009 (ref 1.005–1.030)
pH: 5 (ref 5.0–8.0)

## 2024-05-17 LAB — ECHOCARDIOGRAM COMPLETE
Area-P 1/2: 4.31 cm2
Height: 73 in
S' Lateral: 3 cm
Weight: 2687.85 [oz_av]

## 2024-05-17 LAB — LACTIC ACID, PLASMA: Lactic Acid, Venous: 0.8 mmol/L (ref 0.5–1.9)

## 2024-05-17 LAB — C-REACTIVE PROTEIN: CRP: 17.8 mg/dL — ABNORMAL HIGH (ref <1.0)

## 2024-05-17 LAB — CREATININE, SERUM
Creatinine, Ser: 3.19 mg/dL — ABNORMAL HIGH (ref 0.61–1.24)
GFR, Estimated: 19 mL/min — ABNORMAL LOW (ref >60)

## 2024-05-17 LAB — MAGNESIUM: Magnesium: 1.9 mg/dL (ref 1.7–2.4)

## 2024-05-17 LAB — PROCALCITONIN: Procalcitonin: 1.88 ng/mL

## 2024-05-17 MED ORDER — POLYETHYLENE GLYCOL 3350 17 G PO PACK: 17.0000 g | PACK | Freq: Every day | ORAL | Status: DC | PRN

## 2024-05-17 MED ORDER — PIPERACILLIN-TAZOBACTAM 3.375 G IVPB 30 MIN
3.3750 g | Freq: Once | INTRAVENOUS | Status: AC
  Administered 2024-05-18: 3.375 g via INTRAVENOUS
  Filled 2024-05-17: qty 50

## 2024-05-17 MED ORDER — ACETAMINOPHEN 325 MG PO TABS: 650.0000 mg | ORAL_TABLET | Freq: Four times a day (QID) | ORAL | Status: DC | PRN

## 2024-05-17 MED ORDER — PIPERACILLIN-TAZOBACTAM 3.375 G IVPB
3.3750 g | Freq: Three times a day (TID) | INTRAVENOUS | Status: DC
  Administered 2024-05-18 – 2024-05-21 (×10): 3.375 g via INTRAVENOUS
  Filled 2024-05-17 (×10): qty 50

## 2024-05-17 MED ORDER — POTASSIUM CHLORIDE CRYS ER 20 MEQ PO TBCR
40.0000 meq | EXTENDED_RELEASE_TABLET | ORAL | Status: AC
  Administered 2024-05-17 – 2024-05-18 (×2): 40 meq via ORAL
  Filled 2024-05-17 (×2): qty 2

## 2024-05-17 NOTE — TOC Initial Note (Signed)
 Transition of Care [facility]) - Initial/Assessment Note    Patient Details  Name: Andrew Douglas MRN: [id number] Date of Birth: 06-16-1946  [facility] CM/SW Contact:    Lendia Dais, LCSWA Phone Number: 05/17/2024, 4:03 PM  Clinical Narrative:  Pt is from home w/ spouse Hortense. Pt is independent with ADL's, no DME, and is able to drive outside the hospital. Pt does not have HCPOA paperwork and stated they wanted to talk about it with their wife.  Pt has seen their PCP Dr. Caleb Turmel in the past year and takes meds as prescribed. Pt gave verbal permission to contact spouse.  Pt has no SDOH concerns and is able to afford basic needs. 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  Pt reports no hx of MH/SU and that family can provide transportation.  No current TOC needs. Please place consult for needs.                  Expected Discharge Plan: Home/Self Care Barriers to Discharge: Continued Medical Work up   Patient Goals and CMS Choice Patient states their goals for this hospitalization and ongoing recovery are:: Use walking pad/Play with grandchildren   Choice offered to / list presented to : NA      Expected Discharge Plan and Services In-house Referral: Clinical Social Work     Living arrangements for the past 2 months: Single Family Home                                      Prior Living Arrangements/Services Living arrangements for the past 2 months: Single Family Home Lives with:: Spouse Patient language and need for interpreter reviewed:: Yes Do you feel safe going back to the place where you live?: Yes      Need for Family Participation in Patient Care: No (Comment) Care giver support system in place?: No (comment)   Criminal Activity/Legal Involvement Pertinent to Current Situation/Hospitalization: No - Comment as needed  Activities of Daily Living   ADL Screening (condition at time of  admission) Independently performs ADLs?: Yes (appropriate for developmental age) Is the patient deaf or have difficulty hearing?: No Does the patient have difficulty seeing, even when wearing glasses/contacts?: No Does the patient have difficulty concentrating, remembering, or making decisions?: No  Permission Sought/Granted Permission sought to share information with : Family Supports Permission granted to share information with : Yes, Verbal Permission Granted  Share Information with NAME: Hortense     Permission granted to share info w Relationship: SPouse  Permission granted to share info w Contact Information: [phone]  Emotional Assessment Appearance:: Appears stated age Attitude/Demeanor/Rapport: Engaged Affect (typically observed): Appropriate, Pleasant Orientation: : Oriented to Situation, Oriented to Self, Oriented to Place, Oriented to  Time Alcohol / Substance Use: Not Applicable Psych Involvement: No (comment)  Admission diagnosis:  Syncope and collapse [R55] Dehydration [E86.0] Patient Active Problem List   Diagnosis Date Noted   Syncope and collapse 05/16/2024   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   Intractable nausea and vomiting 11/01/2023   Hyperbilirubinemia 11/01/2023   Biliary obstruction (HCC) 09/30/2023   PCP:  [name], PA Pharmacy:   [facility] [phone] [address], [place] - [address] [place] [address] Phone: [phone] Fax: [phone]     Social Drivers of Health (SDOH) Social History: SDOH Screenings   Food Insecurity: No Food  Insecurity (05/17/2024)  Housing: Low Risk  (05/17/2024)  Transportation Needs: No Transportation Needs (05/17/2024)  Utilities: Not At Risk (05/17/2024)  Depression (PHQ2-9): Low Risk  (04/20/2024)  Financial Resource Strain: Low Risk  (04/19/2022)   Received from [facility]  Social Connections: Unknown (05/17/2024)  Tobacco Use: Medium Risk (05/17/2024)   SDOH  Interventions:     Readmission Risk Interventions    10/03/2023   10:35 AM  Readmission Risk Prevention Plan  Post Dischage Appt Complete  Medication Screening Complete  Transportation Screening Complete

## 2024-05-17 NOTE — Progress Notes (Signed)
 Reason for Consult:  Possible cholangitis Referring Provider: Al-Sultani, MD  HPI  Westyn Driggers is an 78 y.o. male with history of GERD, glaucoma, hepatitis C, hypertension, and recent diagnosis of distal cholangiocarcinoma with plans for Whipple with Dr. [name] on 05/24/2024.  Patient was brought in after a syncopal episode while getting a pedicure.  Patient states that he was experiencing sharp pain while pedicurist was attempting to address an ingrown toenail.  He became nauseated and then passed out.  He was hypotensive on arrival.  Patient denied any current nausea, vomiting, diarrhea, shortness of breath, chest pain, or abdominal pain once he arrived in the emergency department.  On arrival to the emergency department his labs were notable for leukocytosis to 15 as well as elevated bilirubin to 19.6.  General surgery and GI were not consulted for this elevated bilirubin at this time.  On review in epic, it seems that the patient's highest bilirubin prior to this was 10.5.  There was concern for cholangitis, because patient have recorded fever of 102.9.  Per TRH, patient's room was set to 80 degrees at the time and patient was under several blankets.  On recheck patient's temperature was 100.6 without any administration of ibuprofen  or Tylenol .  GI was consulted and recommended [facility] reach out to general surgery to see if there could be a change in timing of procedure to sooner, as they felt the bilirubin was elevated due to his known cholangiocarcinoma.  There were no repeat labs initially, which I recommended when called.  Leukocytosis trending up to 19, and bilirubin 18.2 (was 17.6 on labs yesterday morning).  Patient clinically looks well and is dynamically stable.  His leukocytosis and questionable fever are concerning for cholangitis, although patient clinically does not seem to be consistent with cholangitis.  Patient is jaundiced, and wife does think that it has been getting  worse over the last few days.  Patient has not had much of an appetite, but this is his baseline as of late, no nausea or vomiting with eating.  His CT scan that was obtained when he came in after syncopal fall did show CBD dilation to 18 mm, previously it was 11 mm.  He did have 1 biliary stent removed on 04/03/2024.   10 point review of systems is negative except as listed above in HPI.  Objective  Past Medical History: Past Medical History:  Diagnosis Date   Hepatitis C    Hypertension     Past Surgical History: Past Surgical History:  Procedure Laterality Date   BACK SURGERY     a little back problem 20 or 25 years ago   BILIARY DILATION  10/04/2023   Procedure: DILATION, STRICTURE, BILE DUCT;  Surgeon: [name] Kitchens, MD;  Location: [facility];  Service: Gastroenterology;;   CHOLECYSTECTOMY N/A 10/03/2023   Procedure: LAPAROSCOPIC CHOLECYSTECTOMY WITH ICG DYE AND  INTRAOPERATIVE CHOLANGIOGRAM;  Surgeon: Vanderbilt Ned, MD;  Location: [facility];  Service: General;  Laterality: N/A;  ICG   ERCP N/A 10/04/2023   Procedure: ERCP, WITH INTERVENTION IF INDICATED;  Surgeon: [name] Kitchens, MD;  Location: [facility];  Service: Gastroenterology;  Laterality: N/A;   ERCP N/A 11/01/2023   Procedure: ERCP, WITH INTERVENTION IF INDICATED;  Surgeon: [name] Kitchens, MD;  Location: [facility];  Service: Gastroenterology;  Laterality: N/A;   ERCP N/A 04/03/2024   Procedure: ERCP, WITH INTERVENTION IF INDICATED;  Surgeon: [name] Kitchens, MD;  Location: [facility];  Service: Gastroenterology;  Laterality: N/A;  stent removal  ESOPHAGOGASTRODUODENOSCOPY N/A 10/02/2023   Procedure: EGD (ESOPHAGOGASTRODUODENOSCOPY);  Surgeon: Avram Lupita BRAVO, MD;  Location: [facility];  Service: Gastroenterology;  Laterality: N/A;   SPHINCTEROTOMY  10/04/2023   Procedure: SPHINCTEROTOMY, BILIARY;  Surgeon: [name] Kitchens, MD;  Location: [facility];  Service: Gastroenterology;;    Family History:  History reviewed. No  pertinent family history.  Social History:  reports that he has quit smoking. His smoking use included cigarettes. He has been exposed to tobacco smoke. He has never used smokeless tobacco. He reports that he does not currently use alcohol. He reports that he does not currently use drugs.  Allergies:  Allergies  Allergen Reactions   Oxycontin  [Oxycodone ]     Crazy dreams    Medications: I have reviewed the patient's current medications.  Labs: I have personally reviewed all labs for the past 24h  Imaging: I have personally reviewed and interpreted all imaging for the past 24h and agree with the radiologist's impression.  ECHOCARDIOGRAM COMPLETE Result Date: 05/17/2024    ECHOCARDIOGRAM REPORT   Patient Name:   CHRSTOPHER MALENFANT Date of Exam: 05/17/2024 Medical Rec #:  [id number]        Height:       73.0 in Accession #:    [id number]       Weight:       168.0 lb Date of Birth:  [birth year]        BSA:          1.998 m Patient Age:    78 years         BP:           93/64 mmHg Patient Gender: M                HR:           85 bpm. Exam Location:  Inpatient Procedure: 2D Echo, Cardiac Doppler and Color Doppler (Both Spectral and Color            Flow Doppler were utilized during procedure). Indications:    Syncope  History:        Patient has no prior history of Echocardiogram examinations.                 Signs/Symptoms:Syncope and Hypotension; Risk Factors:Former                 Smoker and Hypertension.  Sonographer:    Juliene Rucks Referring Phys: [id number] SARA-MAIZ A THOMAS IMPRESSIONS  1. Left ventricular ejection fraction, by estimation, is 60 to 65%. The left ventricle has normal function. The left ventricle has no regional wall motion abnormalities. There is mild concentric left ventricular hypertrophy. Left ventricular diastolic parameters are consistent with Grade I diastolic dysfunction (impaired relaxation).  2. Right ventricular systolic function is normal. The right ventricular size is  normal.  3. The mitral valve is normal in structure. Trivial mitral valve regurgitation. No evidence of mitral stenosis.  4. The aortic valve is normal in structure. Aortic valve regurgitation is not visualized. No aortic stenosis is present.  5. The inferior vena cava is normal in size with greater than 50% respiratory variability, suggesting right atrial pressure of 3 mmHg. FINDINGS  Left Ventricle: Left ventricular ejection fraction, by estimation, is 60 to 65%. The left ventricle has normal function. The left ventricle has no regional wall motion abnormalities. The left ventricular internal cavity size was normal in size. There is  mild concentric left ventricular hypertrophy. Left ventricular diastolic parameters are consistent with Grade  I diastolic dysfunction (impaired relaxation). Right Ventricle: The right ventricular size is normal. No increase in right ventricular wall thickness. Right ventricular systolic function is normal. Left Atrium: Left atrial size was normal in size. Right Atrium: Right atrial size was normal in size. Pericardium: There is no evidence of pericardial effusion. Mitral Valve: The mitral valve is normal in structure. Trivial mitral valve regurgitation. No evidence of mitral valve stenosis. Tricuspid Valve: The tricuspid valve is normal in structure. Tricuspid valve regurgitation is trivial. No evidence of tricuspid stenosis. Aortic Valve: The aortic valve is normal in structure. Aortic valve regurgitation is not visualized. No aortic stenosis is present. Pulmonic Valve: The pulmonic valve was normal in structure. Pulmonic valve regurgitation is trivial. No evidence of pulmonic stenosis. Aorta: The aortic root is normal in size and structure. Venous: The inferior vena cava is normal in size with greater than 50% respiratory variability, suggesting right atrial pressure of 3 mmHg. IAS/Shunts: No atrial level shunt detected by color flow Doppler.  LEFT VENTRICLE PLAX 2D LVIDd:          4.40 cm   Diastology LVIDs:         3.00 cm   LV e' medial:    8.24 cm/s LV PW:         1.10 cm   LV E/e' medial:  9.8 LV IVS:        1.00 cm   LV e' lateral:   6.84 cm/s LVOT diam:     2.10 cm   LV E/e' lateral: 11.8 LV SV:         68 LV SV Index:   34 LVOT Area:     3.46 cm  RIGHT VENTRICLE             IVC RV Basal diam:  3.60 cm     IVC diam: 1.90 cm RV Mid diam:    3.40 cm RV S prime:     19.70 cm/s TAPSE (M-mode): 1.9 cm LEFT ATRIUM             Index        RIGHT ATRIUM           Index LA diam:        3.00 cm 1.50 cm/m   RA Area:     14.30 cm LA Vol (A2C):   64.7 ml 32.38 ml/m  RA Volume:   31.00 ml  15.52 ml/m LA Vol (A4C):   37.2 ml 18.62 ml/m LA Biplane Vol: 49.5 ml 24.78 ml/m  AORTIC VALVE LVOT Vmax:   100.00 cm/s LVOT Vmean:  68.100 cm/s LVOT VTI:    0.195 m  AORTA Ao Root diam: 3.70 cm Ao Asc diam:  3.40 cm MITRAL VALVE MV Area (PHT): 4.31 cm     SHUNTS MV Decel Time: 176 msec     Systemic VTI:  0.20 m MV E velocity: 80.40 cm/s   Systemic Diam: 2.10 cm MV A velocity: 126.00 cm/s MV E/A ratio:  0.64 Toribio Fuel MD Electronically signed by Toribio Fuel MD Signature Date/Time: 05/17/2024/11:03:28 AM    Final    CT HEAD WO CONTRAST ( ) Result Date: 05/17/2024 EXAM: CT HEAD WITHOUT CONTRAST 05/17/2024 04:06:37 AM TECHNIQUE: CT of the head was performed without the administration of intravenous contrast. Automated exposure control, iterative reconstruction, and/or weight based adjustment of the mA/kV was utilized to reduce the radiation dose to as low as reasonably achievable. COMPARISON: None available. CLINICAL HISTORY: 78 year old male. Syncope/presyncope, cerebrovascular  cause suspected. FINDINGS: BRAIN AND VENTRICLES: No acute hemorrhage. Patchy asymmetric hypodensity posterior right lentiform nucleus, representing mild age indeterminate small vessel disease changes in the right basal ganglia. Otherwise, gray-white differentiation is within normal limits for age. Brain volume is normal  for age. No hydrocephalus. No extra-axial collection. No mass effect or midline shift. Calcified atherosclerosis at the skull base. No suspicious intracranial vascular hyperdensity. ORBITS: No acute abnormality. SINUSES: Scattered paranasal sinus mild mucosal thickening. No sinus fluid levels identified. SOFT TISSUES AND SKULL: No acute soft tissue abnormality. No skull fracture. Middle ears and mastoids remain well aerated. IMPRESSION: 1. Mild age-indeterminate small vessel disease changes in the right basal ganglia. Electronically signed by: Helayne Hurst MD 05/17/2024 05:06 AM EST RP Workstation: [id number]     Physical Exam Blood pressure 101/65, pulse 79, temperature 98 F (36.7 C), resp. rate 15, height 6' 1 (1.854 m), weight 76.2 kg, SpO2 100%. General: no acute distress HEENT: normocephalic, atraumatic, lateral icterus Oropharynx: mucous membranes moist CV: Regular rate and rhythm, normotensive Chest: equal chest rise bilaterally normal respiratory effort on room air Abdomen: soft, nontender, and nondistended Extremities: moves all extremities Skin: warm, dry, jaundiced Psych: normal memory, normal mood/affect  Neuro: No focal neurologic deficits, A&Ox3    Assessment   Ramiz Turpin is an 78 y.o. male with cholangiocarcinoma, and concern for cholangitis based on labs.  Plan  - Agree with Zosyn - Given labs trending back up, would order MRCP for more data, as patient clinically does not appear consistent with cholangitis, however, lab work and possible fever are concerning. - NPO with sips and chips until MRCP and discussion with primary surgeon had in a.m. - I will discuss with Dr. [name] in the morning - If patient becomes hemodynamically stable, may need urgent/emergent endoscopic decompression. Appreciate GI following. TR[facility] discussed directly with Dr. Saintclair overnight  I reviewed ED provider notes, hospitalist notes, last 24 h vitals and pain scores, last 48 h intake and  output, last 24 h labs and trends, last 24 h imaging results, and Dr. Dia most recent clinic notes, GI procedure notes, clinic notes, and consult notes..  This care required high  level of medical decision making.    Orie Silversmith, MD General Surgery, Surgical Critical Care and Trauma

## 2024-05-17 NOTE — Telephone Encounter (Addendum)
 I called the patient's wife twice earlier today and was unable to get through or leave a message.   I tried her a third time this afternoon and left a voicemail requesting a call back. I also left a message on their home number.    _______________________________________________________  From Andrew Douglas is a 78 y.o. male history of biliary carcinoma, pending upcoming Whipple surgery, jaundice presenting with episode of syncope.  The patient reports that he was getting his toenails done.  He reports that he had a sore area on his toe previously, from a prior nail treatment, the technician poked him in the toe and then he developed nausea, lightheadedness and lost consciousness.  He reports that when he woke up he felt weak.  He was unconscious for approximately 30 seconds.  Was initially hypotensive and received fluids.  He reports after receiving fluids he is feeling much better, possibly better than he has recently as he has not been able to eat or drink much.  Denies any abdominal pain, chest pain, shortness of breath, fevers or chills, vomiting, back pain, lightheadedness or dizziness, palpitations, leg swelling.  Has surgery scheduled for later this month.    Workup notable for significant AKI. Obtained additional history from wife who is with patient, reports that he said ow after being poked and then had episode of shaking and eyes rolled back and head, no incontinence or tongue biting. Following this he seemed to be able to respond to questions normally although was seems sleepy. Seems more consistent with syncope given no postictal period, tongue biting or incontinence, which correlates with the patient's experience and dehydration. Discussed with Dr. Debby who will admit patient.   Today's labs:

## 2024-05-17 NOTE — Hospital Course (Addendum)
 The patient is a 78 year old male with PMHx of distal cholangiocarcinoma of biliary tract c/b obstruction s/p stent placement then removal s/p cholecystectomy with plans for Whipple procedure on 05/24/2024, HTN, GERD, glaucoma, hepatitis C, who presented on 05/16/2024 after syncopal episode.  Per the patient's report, he was feeling weak for 4-5 days with overall decreased p.o. intake and poor appetite for several weeks.  He notes that he was at a nail salon getting a pedicure when the technician worked on a very tender area with an ingrown toenail that causes excruciating pain and ended up having a syncopal episode only preceded by visual changes he described asthe things on the wall became animated but denied chest pain, SOB, nausea, dizziness, lightheadedness. Syncope last ~ 30 seconds after which he was able to respond to questions, with no postictal state, tongue biting, or incontinence reported. Per ED documentation, the patient was hypotensive with SBP in the 80s on EMS's arrival. He received a bolus of LR with improvement in BP to 90/50 and subjective improvement in symptoms.   In the ED, he was afebrile with a temp of 97.9 F, RR 10, HR 91, BP 89/59, SpO2 100% on RA.  CBC was significant for leukocytosis to 15.1, Hgb 10.7, PLTs 495.  CMP was remarkable for hypokalemia to 2.8, BUN 64, creatinine 3.7 (baseline 0.7-1), albumin 1.9, AST 68, ALT 59, alk phos 312, T. bili 19.6.  Lipase 18.  BNP 46.  UA showed moderate bilirubin and rare bacteria.  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  CT abdomen pelvis without contrast showed no evidence of acute intra-abdominal or pelvic abnormality, but noted patient is status postcholecystectomy with moderate intra and extrahepatic biliary dilatation, and CBD measurement of 18 mm appearing slightly increased compared to prior exams.  In the ED, it was felt like his syncope was  vasovagal in nature with contribution from dehydration due to decreased p.o. intake that improved significantly after receiving 2 L NS. However, he was admitted due to AKI.  Assessment and Plan:  # Cholangiocarcinoma s/p biliary stent placement then removal and cholecystectomy # Acute cholangitis - Recently diagnosed with cholangiocarcinoma, per chart review, appears to have had a biliary stent place but ultimately removed in 03/2024 by Dr. [name] - Patient was evaluated by general surgery and originally scheduled for Whipple procedure on 05/24/2024 with Dr. Byerly - CT A/P showed CBD dilation to 18 mm appearing slightly increased compared to prior - T.bili improving to 14.8 today - Fevers resolved, leukocytosis improving - Blood cultures positive for Pseudomonas aeruginosa, susceptibilities pending - Continue IV Zosyn  - ID following - Surgery following, Dr. [name] evaluated patient, deferring clinical procedure until after ERCP and reduction in T. bili - GI following, unable to schedule ERCP today, plans for ERCP tomorrow  # AKI  - Cr elevated to 3.70 on admission, elevated from baseline of ~ 0.7-1.0 - Likely secondary to hypotension prior to admission with concomitant dehydration from several days of decreased PO intake  - Improving slowly with IVFs - Cr 2.28 today - Continue IVFs NS 100 mL/hr  - Encourage p.o. intake  # Hypokalemia - K 2.8 on admission - 3.1 today - Continue repletion  # Anemia of chronic disease - Hgb 9.2, stable, noted to be 12.4 ~ 4 weeks prior - Fe 34, TIBC 169, ferritin 1,035 -- consistent with ACD in the setting of cholangiocarcinoma and possible acute cholangitis - No indication for IV iron at  this time  # Syncope - Syncopal episode presumed to be vasovagal given onset after experiencing excruciating pain in the setting of dehydration. No  - Improved with IVFs with no recurrence of episodes - CT head showed mild age-indeterminate small vessel disease  changes in the right basal ganglia - Echo (11/6) showed LVEF 60-65%, no RWMA, trivial MR, no AS, AR, or MS - No focal neurologic deficits noted on exam  #Severe hypoalbuminemia - Albumin less than 1.5, prealbumin less than 5 - Concern for malnutrition in setting of cholangiocarcinoma and cholangitis - Dietitian consulted  # Hypertension - Currently low normal blood pressures without medications

## 2024-05-17 NOTE — Plan of Care (Signed)
   Problem: Education: Goal: Knowledge of General Education information will improve Description Including pain rating scale, medication(s)/side effects and non-pharmacologic comfort measures Outcome: Progressing   Problem: Health Behavior/Discharge Planning: Goal: Ability to manage health-related needs will improve Outcome: Progressing

## 2024-05-17 NOTE — Progress Notes (Signed)
 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  Per the patient's report, he was feeling weak for 4-5 days with overall decreased p.o. intake and poor appetite for several weeks.  He notes that he was at a nail salon getting a pedicure when the technician worked on a very tender area with an ingrown toenail that causes excruciating pain and ended up having a syncopal episode only preceded by visual changes he described asthe things on the wall became animated but denied chest pain, SOB, nausea, dizziness, lightheadedness. Syncope last ~ 30 seconds after which he was able to respond to questions, with no postictal state, tongue biting, or incontinence reported. Per ED documentation, the patient was hypotensive with SBP in the 80s on EMS's arrival. He received a bolus of LR with improvement in BP to 90/50 and subjective improvement in symptoms.   In the ED, he was afebrile with a temp of 97.9 F, RR 10, HR 91, BP 89/59, SpO2 100% on RA.  CBC was significant for leukocytosis to 15.1, Hgb 10.7, PLTs 495.  CMP was remarkable for hypokalemia to 2.8, BUN 64, creatinine 3.7 (baseline 0.7-1), albumin 1.9, AST 68, ALT 59, alk phos 312, T. bili 19.6.  Lipase 18.  BNP 46.  UA showed moderate bilirubin and rare bacteria.  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  CT abdomen pelvis without contrast showed no evidence of acute intra-abdominal or pelvic abnormality, but noted patient is status postcholecystectomy with moderate intra and extrahepatic biliary  dilatation, and CBD measurement of 18 mm appearing slightly increased compared to prior exams.  In the ED, it was felt like his syncope was vasovagal in nature with contribution from dehydration due to decreased p.o. intake that improved significantly after receiving 2 L NS. However, he was admitted due to AKI.  Assessment and Plan:  # Syncope - Syncopal episode presumed to be vasovagal given onset after experiencing excruciating pain in the setting of dehydration. No  - Improved with IVFs with no recurrence of episodes - CT head showed mild age-indeterminate small vessel disease changes in the right basal ganglia - Echo (11/6) showed LVEF 60-65%, no RWMA, trivial MR, no AS, AR, or MS - No focal neurologic deficits noted on exam  # AKI  - Cr elevated to 3.70 on admission, elevated from baseline of ~ 0.7-1.0 - Likely secondary to hypotension prior to admission with concomitant dehydration from several days of decreased PO intake  - Improving slowly with IVFs - Cr 3.20 today - Continue IVFs NS 100 mL/hr   # Hypokalemia - K 2.8 on admission - Improved to 3.1 today - Continue repletion  # Cholangiocarcinoma s/p biliary stent placement then removal and cholecystectomy # Concern for acute ascending cholangitis - Recently diagnosed with cholangiocarcinoma, per chart review, appears to have had a biliary stent place but ultimately removed in 03/2024 by Dr. [name] - Patient was evaluated by general surgery and scheduled for Whipple procedure on 05/24/2024 with Dr. Byerly - CT A/P showed CBD dilation to 18 mm appearing slightly increased compared to prior - T.bili noted to be elevated to 19.6 on  admission, improved to 17.6 today - Notably, patient had a fever to 102.9 F this evening though RN noted patient was covered with white 6 blankets with room temp to 80 F.  On repeat 1 hour later was 100.6 F - Blood cultures were ordered - Started IV Zosyn  - Called [facility] on-call Dr. Saintclair due to  concern for development of acute ascending cholangitis, recommending discussion with general surgery. Dr. Burnette to evaluate in the morning - Called [facility] on-call Dr. Ann who recommended repeat CBC and CMP to monitor for T. Bili trend, and will also inform Dr. [name] about the patient's admission in the AM  # Hypertension - Currently normotensive without medications     DVT prophylaxis: heparin  injection 5,000 Units Start: 05/16/24 2200   Code Status:   Code Status: Full Code  Family Communication: None  Disposition Plan: Home PT Follow up Recs:   Level of care: Telemetry  Consultants:  Gastroenterology, general surgery  Procedures:  None  Antimicrobials: IV Zosyn   Subjective: Patient was seen at bedside today.  Reported doing very well.  Has not had any presyncope or syncopal episodes.  Denies any chest pain, abdominal pain, nausea, vomiting, fevers, chills, dizziness.  Note, patient was noted to be febrile to 102.9 F this evening.  Per RN, patient was under 6 blankets with the room temp being set to 80 F.  After removal of blankets, patient's temp came down to 100.6 F after 1 hour.  Tylenol  was NOT given.  Objective: Vitals:   05/17/24 0112 05/17/24 0846 05/17/24 1631 05/17/24 2000  BP: 93/65 97/65 114/68 128/70  Pulse: 87 81 81 (!) 111  Resp: 18  18   Temp: 98.3 F (36.8 C)  97.6 F (36.4 C) (!) 102.9 F (39.4 C)  TempSrc: Oral     SpO2: 97% 96% 98% 95%  Weight: 76.2 kg     Height: 6' 1 (1.854 m)      No intake or output data in the 24 hours ending 05/17/24 2031 Filed Weights   05/16/24 1642 05/17/24 0112  Weight: 74.8 kg 76.2 kg    Examination:  General exam: Appears calm and comfortable  Respiratory system: Clear to auscultation. Respiratory effort normal. Cardiovascular system: S1 & S2 heard, RRR. No JVD, murmurs. No pedal edema. Gastrointestinal system: Abdomen is nondistended, soft and nontender. No organomegaly or masses felt. Normal bowel  sounds heard. Central nervous system: Alert and oriented. No focal neurological deficits. Extremities: Symmetric 5 x 5 power. Skin: Jaundiced.  No rashes, lesions or ulcers Psychiatry: Judgement and insight appear normal. Mood & affect appropriate.     Data Reviewed: I have personally reviewed following labs and imaging studies  CBC: Recent Labs  Lab 05/16/24 1645 05/16/24 1852 05/17/24 0342  WBC 15.1*  --  13.4*  NEUTROABS 12.7*  --   --   HGB 10.7* 9.5* 8.8*  HCT 31.0* 28.0* 24.7*  MCV 81.8  --  79.9*  PLT 495*  --  438*   Basic Metabolic Panel: Recent Labs  Lab 05/16/24 1645 05/16/24 1852 05/16/24 2357 05/17/24 0342  NA 135 140  --  137  K 2.8* 2.6*  --  3.1*  CL 97* 101  --  103  CO2 23  --   --  24  GLUCOSE 148* 113*  --  115*  BUN 64* 48*  --  56*  CREATININE 3.70* 3.30* 3.19* 3.20*  CALCIUM 8.4*  --   --  7.8*  MG  --   --  1.9  --    GFR: Estimated Creatinine Clearance: 20.2 mL/min (A) (by C-G formula based on SCr of 3.2 mg/dL (H)). Liver Function Tests: Recent Labs  Lab 05/16/24 1645 05/17/24 0342  AST 68* 53*  ALT 59* 52*  ALKPHOS 312* 266*  BILITOT 19.6* 17.6*  PROT 6.6 6.1*  ALBUMIN 1.9* <1.5*   Recent Labs  Lab 05/16/24 1645  LIPASE 18   No results for input(s): AMMONIA in the last 168 hours. Coagulation Profile: No results for input(s): INR, PROTIME in the last 168 hours. Cardiac Enzymes: No results for input(s): CKTOTAL, CKMB, CKMBINDEX, TROPONINI in the last 168 hours. BNP (last 3 results) No results for input(s): PROBNP in the last 8760 hours. HbA1C: No results for input(s): HGBA1C in the last 72 hours. CBG: No results for input(s): GLUCAP in the last 168 hours. Lipid Profile: No results for input(s): CHOL, HDL, LDLCALC, TRIG, CHOLHDL, LDLDIRECT in the last 72 hours. Thyroid Function Tests: No results for input(s): TSH, T4TOTAL, FREET4, T3FREE, THYROIDAB in the last 72 hours. Anemia  Panel: No results for input(s): VITAMINB12, FOLATE, FERRITIN, TIBC, IRON, RETICCTPCT in the last 72 hours. Sepsis Labs: Recent Labs  Lab 05/16/24 1852 05/17/24 0758  PROCALCITON  --  1.88  LATICACIDVEN 0.9 0.8    No results found for this or any previous visit (from the past 240 hours).   Radiology Studies: ECHOCARDIOGRAM COMPLETE Result Date: 05/17/2024    ECHOCARDIOGRAM REPORT   Patient Name:   Andrew Douglas Date of Exam: 05/17/2024 Medical Rec #:  [id number]        Height:       73.0 in Accession #:    [id number]       Weight:       168.0 lb Date of Birth:  1946/04/14        BSA:          1.998 m Patient Age:    78 years         BP:           93/64 mmHg Patient Gender: M                HR:           85 bpm. Exam Location:  Inpatient Procedure: 2D Echo, Cardiac Doppler and Color Doppler (Both Spectral and Color            Flow Doppler were utilized during procedure). Indications:    Syncope  History:        Patient has no prior history of Echocardiogram examinations.                 Signs/Symptoms:Syncope and Hypotension; Risk Factors:Former                 Smoker and Hypertension.  Sonographer:    Juliene Rucks Referring Phys: [id number] SARA-MAIZ A THOMAS IMPRESSIONS  1. Left ventricular ejection fraction, by estimation, is 60 to 65%. The left ventricle has normal function. The left ventricle has no regional wall motion abnormalities. There is mild concentric left ventricular hypertrophy. Left ventricular diastolic parameters are consistent with Grade I diastolic dysfunction (impaired relaxation).  2. Right ventricular systolic function is normal. The right ventricular size is normal.  3. The mitral valve is normal in structure. Trivial mitral valve regurgitation. No evidence of mitral stenosis.  4. The aortic valve is normal in structure. Aortic valve regurgitation is not visualized. No aortic stenosis is present.  5. The inferior vena cava is  normal in size with greater than 50%  respiratory variability, suggesting right atrial pressure of 3 mmHg. FINDINGS  Left Ventricle: Left ventricular ejection fraction, by estimation, is 60 to 65%. The left ventricle has normal function. The left ventricle has no regional wall motion abnormalities. The left ventricular internal cavity size was normal in size. There is  mild concentric left ventricular hypertrophy. Left ventricular diastolic parameters are consistent with Grade I diastolic dysfunction (impaired relaxation). Right Ventricle: The right ventricular size is normal. No increase in right ventricular wall thickness. Right ventricular systolic function is normal. Left Atrium: Left atrial size was normal in size. Right Atrium: Right atrial size was normal in size. Pericardium: There is no evidence of pericardial effusion. Mitral Valve: The mitral valve is normal in structure. Trivial mitral valve regurgitation. No evidence of mitral valve stenosis. Tricuspid Valve: The tricuspid valve is normal in structure. Tricuspid valve regurgitation is trivial. No evidence of tricuspid stenosis. Aortic Valve: The aortic valve is normal in structure. Aortic valve regurgitation is not visualized. No aortic stenosis is present. Pulmonic Valve: The pulmonic valve was normal in structure. Pulmonic valve regurgitation is trivial. No evidence of pulmonic stenosis. Aorta: The aortic root is normal in size and structure. Venous: The inferior vena cava is normal in size with greater than 50% respiratory variability, suggesting right atrial pressure of 3 mmHg. IAS/Shunts: No atrial level shunt detected by color flow Doppler.  LEFT VENTRICLE PLAX 2D LVIDd:         4.40 cm   Diastology LVIDs:         3.00 cm   LV e' medial:    8.24 cm/s LV PW:         1.10 cm   LV E/e' medial:  9.8 LV IVS:        1.00 cm   LV e' lateral:   6.84 cm/s LVOT diam:     2.10 cm   LV E/e' lateral: 11.8 LV SV:         68 LV SV Index:   34 LVOT Area:     3.46 cm  RIGHT VENTRICLE             IVC  RV Basal diam:  3.60 cm     IVC diam: 1.90 cm RV Mid diam:    3.40 cm RV S prime:     19.70 cm/s TAPSE (M-mode): 1.9 cm LEFT ATRIUM             Index        RIGHT ATRIUM           Index LA diam:        3.00 cm 1.50 cm/m   RA Area:     14.30 cm LA Vol (A2C):   64.7 ml 32.38 ml/m  RA Volume:   31.00 ml  15.52 ml/m LA Vol (A4C):   37.2 ml 18.62 ml/m LA Biplane Vol: 49.5 ml 24.78 ml/m  AORTIC VALVE LVOT Vmax:   100.00 cm/s LVOT Vmean:  68.100 cm/s LVOT VTI:    0.195 m  AORTA Ao Root diam: 3.70 cm Ao Asc diam:  3.40 cm MITRAL VALVE MV Area (PHT): 4.31 cm     SHUNTS MV Decel Time: 176 msec     Systemic VTI:  0.20 m MV E velocity: 80.40 cm/s   Systemic Diam: 2.10 cm MV A velocity: 126.00 cm/s MV E/A ratio:  0.64 Toribio Fuel MD Electronically signed by Toribio Fuel MD Signature Date/Time: 05/17/2024/11:03:28 AM  Final    CT HEAD WO CONTRAST ( ) Result Date: 05/17/2024 EXAM: CT HEAD WITHOUT CONTRAST 05/17/2024 04:06:37 AM TECHNIQUE: CT of the head was performed without the administration of intravenous contrast. Automated exposure control, iterative reconstruction, and/or weight based adjustment of the mA/kV was utilized to reduce the radiation dose to as low as reasonably achievable. COMPARISON: None available. CLINICAL HISTORY: 78 year old male. Syncope/presyncope, cerebrovascular cause suspected. FINDINGS: BRAIN AND VENTRICLES: No acute hemorrhage. Patchy asymmetric hypodensity posterior right lentiform nucleus, representing mild age indeterminate small vessel disease changes in the right basal ganglia. Otherwise, gray-white differentiation is within normal limits for age. Brain volume is normal for age. No hydrocephalus. No extra-axial collection. No mass effect or midline shift. Calcified atherosclerosis at the skull base. No suspicious intracranial vascular hyperdensity. ORBITS: No acute abnormality. SINUSES: Scattered paranasal sinus mild mucosal thickening. No sinus fluid levels identified. SOFT  TISSUES AND SKULL: No acute soft tissue abnormality. No skull fracture. Middle ears and mastoids remain well aerated. IMPRESSION: 1. Mild age-indeterminate small vessel disease changes in the right basal ganglia. Electronically signed by: Helayne Hurst MD 05/17/2024 05:06 AM EST RP Workstation: [id number]   CT ABDOMEN PELVIS WO CONTRAST Result Date: 05/16/2024 CLINICAL DATA:  Abdominal pain EXAM: CT ABDOMEN AND PELVIS WITHOUT CONTRAST TECHNIQUE: Multidetector CT imaging of the abdomen and pelvis was performed following the standard protocol without IV contrast. RADIATION DOSE REDUCTION: This exam was performed according to the departmental dose-optimization program which includes automated exposure control, adjustment of the mA and/or kV according to patient size and/or use of iterative reconstruction technique. COMPARISON:  Chest CT 04/24/2024, MRI 04/12/2024, CT 10/31/2023 FINDINGS: Lower chest: Lung bases demonstrate chronic interstitial lung disease. No acute airspace disease. Hepatobiliary: Cholecystectomy. Moderate intra and extrahepatic biliary dilatation, common bile duct diameter measures 18 mm and may be slightly increased compared to prior. Pancreas: Unremarkable. No pancreatic ductal dilatation or surrounding inflammatory changes. Spleen: Normal in size without focal abnormality. Adrenals/Urinary Tract: Adrenal glands are normal. Right renal cyst for which no imaging follow-up is recommended. No hydronephrosis. The bladder is unremarkable. Stomach/Bowel: Stomach nonenlarged. No dilated small bowel. No acute bowel wall thickening. Diverticular disease of the colon. Vascular/Lymphatic: Aortic atherosclerosis. No enlarged abdominal or pelvic lymph nodes. Reproductive: Slightly enlarged prostate Other: Negative for ascites or free air. Small fat containing inguinal hernias. Musculoskeletal: No acute osseous abnormality IMPRESSION: 1. No CT evidence for acute intra-abdominal or pelvic abnormality. 2. Chronic  interstitial lung disease at the bases. 3. Status post cholecystectomy. Moderate intra and extrahepatic biliary dilatation, common bile duct diameter measures 18 mm and appears slightly increased compared to prior exams. 4. Diverticular disease of the colon without acute inflammatory process. 5. Aortic atherosclerosis. Aortic Atherosclerosis (ICD10-I70.0). Electronically Signed   By: Luke Bun M.D.   On: 05/16/2024 20:07   DG Chest Portable 1 View Result Date: 05/16/2024 EXAM: 1 VIEW(S) XRAY OF THE CHEST 05/16/2024 04:57:00 PM COMPARISON: Chest CT dated 04/24/2024. CLINICAL HISTORY: syncope FINDINGS: LUNGS AND PLEURA: Chronic lung changes with pulmonary fibrosis and bronchiectasis are noted on the comparison chest CT dated 04/24/2024. No acute overlying pulmonary process. No pulmonary edema. No pleural effusion. No pneumothorax. HEART AND MEDIASTINUM: No acute abnormality of the cardiac and mediastinal silhouettes. BONES AND SOFT TISSUES: No acute osseous abnormality. IMPRESSION: 1. No acute cardiopulmonary process. 2. 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 Electronically signed by: Maude Stammer MD 05/16/2024 05:10 PM EST RP Workstation: [id number]    Scheduled Meds:  dorzolamide -timolol   1 drop  Both Eyes BID   heparin   5,000 Units Subcutaneous Q8H   latanoprost   1 drop Both Eyes QHS   melatonin  3 mg Oral QHS   Continuous Infusions:  sodium chloride  100 mL/hr at 05/17/24 1204     LOS:  LOS: 1 day   Time Spent: 75 minutes  Unresulted Labs (From admission, onward)     Start     Ordered   05/17/24 0121  Urine Culture (for pregnant, neutropenic or urologic patients or patients with an indwelling urinary catheter)  (Urine Labs)  Once,   R       Question:  Indication  Answer:  Altered mental status (if no other cause identified)   05/17/24 0120             Ardelle Haliburton Al-Sultani, MD [facility]  If 7PM-7AM, please contact  night-coverage  05/17/2024, 8:31 PM

## 2024-05-17 NOTE — Progress Notes (Incomplete Revision)
 Was called by [facility] regarding this patient who is scheduled for Whipple next week with Dr. [name] for cholangiocarcinoma. Was admitted for syncope and AKI yesterday and had Tbili of 19.6 From chart review, highest Tbili has been in the past from our Epic is 10.5. Concern for possible fever documented. Had leukocytosis to 15.1 on admission, downtrended to 13.4 this morning. [facility] discussed with GI who recommended reaching out to surgery team.  His leukocytosis and fever (some question if true fever as patient's room was set to 80 and under many blankets, repeat much lower without any medications given), as well as Tbili would be concerning for cholangitis. CBD on CT yesterday was dilated to 18mm from 11 on prior imaging.   Recommended repeat labs (most recent from ~0330 this morning). If continued hyperbilirubinemia and leukocytosis would discuss with GI regarding further imaging with MRCP versus proceeding with endoscopic decompression urgently, would not offer emergent surgical intervention for cholangitis.  Will discuss patient with Dr. [name] in AM.

## 2024-05-17 NOTE — Progress Notes (Signed)
 Echocardiogram 2D Echocardiogram has been performed.  Andrew Douglas 05/17/2024, 9:45 AM

## 2024-05-18 ENCOUNTER — Inpatient Hospital Stay (HOSPITAL_COMMUNITY)

## 2024-05-18 DIAGNOSIS — R55 Syncope and collapse: Secondary | ICD-10-CM | POA: Diagnosis not present

## 2024-05-18 LAB — COMPREHENSIVE METABOLIC PANEL WITH GFR
ALT: 54 U/L — ABNORMAL HIGH (ref 0–44)
ALT: 50 U/L — ABNORMAL HIGH (ref 0–44)
AST: 64 U/L — ABNORMAL HIGH (ref 15–41)
AST: 55 U/L — ABNORMAL HIGH (ref 15–41)
Albumin: 1.6 g/dL — ABNORMAL LOW (ref 3.5–5.0)
Albumin: <1.5 g/dL — ABNORMAL LOW (ref 3.5–5.0)
Alkaline Phosphatase: 299 U/L — ABNORMAL HIGH (ref 38–126)
Alkaline Phosphatase: 276 U/L — ABNORMAL HIGH (ref 38–126)
Anion gap: 11 (ref 5–15)
Anion gap: 13 (ref 5–15)
BUN: 47 mg/dL — ABNORMAL HIGH (ref 8–23)
BUN: 43 mg/dL — ABNORMAL HIGH (ref 8–23)
CO2: 23 mmol/L (ref 22–32)
CO2: 22 mmol/L (ref 22–32)
Calcium: 7.9 mg/dL — ABNORMAL LOW (ref 8.9–10.3)
Calcium: 7.9 mg/dL — ABNORMAL LOW (ref 8.9–10.3)
Chloride: 105 mmol/L (ref 98–111)
Chloride: 106 mmol/L (ref 98–111)
Creatinine, Ser: 2.7 mg/dL — ABNORMAL HIGH (ref 0.61–1.24)
Creatinine, Ser: 2.61 mg/dL — ABNORMAL HIGH (ref 0.61–1.24)
GFR, Estimated: 23 mL/min — ABNORMAL LOW (ref >60)
GFR, Estimated: 24 mL/min — ABNORMAL LOW (ref >60)
Glucose, Bld: 124 mg/dL — ABNORMAL HIGH (ref 70–99)
Glucose, Bld: 150 mg/dL — ABNORMAL HIGH (ref 70–99)
Potassium: 3.3 mmol/L — ABNORMAL LOW (ref 3.5–5.1)
Potassium: 3.3 mmol/L — ABNORMAL LOW (ref 3.5–5.1)
Sodium: 139 mmol/L (ref 135–145)
Sodium: 141 mmol/L (ref 135–145)
Total Bilirubin: 18.2 mg/dL (ref 0.0–1.2)
Total Bilirubin: 17.1 mg/dL — ABNORMAL HIGH (ref 0.0–1.2)
Total Protein: 6 g/dL — ABNORMAL LOW (ref 6.5–8.1)
Total Protein: 5.9 g/dL — ABNORMAL LOW (ref 6.5–8.1)

## 2024-05-18 LAB — CBC
HCT: 25.6 % — ABNORMAL LOW (ref 39.0–52.0)
HCT: 26.6 % — ABNORMAL LOW (ref 39.0–52.0)
Hemoglobin: 9 g/dL — ABNORMAL LOW (ref 13.0–17.0)
Hemoglobin: 9.3 g/dL — ABNORMAL LOW (ref 13.0–17.0)
MCH: 27.9 pg (ref 26.0–34.0)
MCH: 28.2 pg (ref 26.0–34.0)
MCHC: 35 g/dL (ref 30.0–36.0)
MCHC: 35.2 g/dL (ref 30.0–36.0)
MCV: 79.9 fL — ABNORMAL LOW (ref 80.0–100.0)
MCV: 80.3 fL (ref 80.0–100.0)
Platelets: 424 K/uL — ABNORMAL HIGH (ref 150–400)
Platelets: 441 K/uL — ABNORMAL HIGH (ref 150–400)
RBC: 3.19 MIL/uL — ABNORMAL LOW (ref 4.22–5.81)
RBC: 3.33 MIL/uL — ABNORMAL LOW (ref 4.22–5.81)
RDW: 14.9 % (ref 11.5–15.5)
RDW: 15 % (ref 11.5–15.5)
WBC: 17.8 K/uL — ABNORMAL HIGH (ref 4.0–10.5)
WBC: 19.1 K/uL — ABNORMAL HIGH (ref 4.0–10.5)
nRBC: 0 % (ref 0.0–0.2)
nRBC: 0 % (ref 0.0–0.2)

## 2024-05-18 LAB — PROTIME-INR
INR: 1.3 — ABNORMAL HIGH (ref 0.8–1.2)
Prothrombin Time: 17.4 s — ABNORMAL HIGH (ref 11.4–15.2)

## 2024-05-18 LAB — BILIRUBIN, DIRECT: Bilirubin, Direct: 11 mg/dL — ABNORMAL HIGH (ref 0.0–0.2)

## 2024-05-18 LAB — RETICULOCYTES
Immature Retic Fract: 13.7 % (ref 2.3–15.9)
RBC.: 3.24 MIL/uL — ABNORMAL LOW (ref 4.22–5.81)
Retic Count, Absolute: 41.5 K/uL (ref 19.0–186.0)
Retic Ct Pct: 1.3 % (ref 0.4–3.1)

## 2024-05-18 LAB — IRON AND TIBC
Iron: 34 ug/dL — ABNORMAL LOW (ref 45–182)
Saturation Ratios: 20 % (ref 17.9–39.5)
TIBC: 169 ug/dL — ABNORMAL LOW (ref 250–450)
UIBC: 135 ug/dL

## 2024-05-18 LAB — FOLATE: Folate: >20 ng/mL (ref >5.9)

## 2024-05-18 LAB — FERRITIN: Ferritin: 1035 ng/mL — ABNORMAL HIGH (ref 24–336)

## 2024-05-18 LAB — VITAMIN B12: Vitamin B-12: 1321 pg/mL — ABNORMAL HIGH (ref 180–914)

## 2024-05-18 MED ORDER — GADOBUTROL 1 MMOL/ML IV SOLN
7.0000 mL | Freq: Once | INTRAVENOUS | Status: AC | PRN
  Administered 2024-05-18: 7 mL via INTRAVENOUS

## 2024-05-18 MED ORDER — DORZOLAMIDE HCL-TIMOLOL MAL 2-0.5 % OP SOLN
1.0000 [drp] | Freq: Two times a day (BID) | OPHTHALMIC | Status: DC
  Administered 2024-05-18 – 2024-05-22 (×9): 1 [drp] via OPHTHALMIC
  Filled 2024-05-18: qty 10

## 2024-05-18 MED ORDER — SODIUM CHLORIDE 0.9 % IV SOLN: INTRAVENOUS | Status: AC

## 2024-05-18 MED ORDER — POTASSIUM CHLORIDE CRYS ER 20 MEQ PO TBCR
40.0000 meq | EXTENDED_RELEASE_TABLET | Freq: Once | ORAL | Status: AC
  Administered 2024-05-18: 40 meq via ORAL
  Filled 2024-05-18: qty 2

## 2024-05-18 NOTE — Consult Note (Signed)
 [facility] Consultation Note  Referring Provider: [facility] Primary Care Physician:  [name], PA Primary Gastroenterologist:  Dr. [name]  Reason for Consultation:  elevated LFTs  HPI: Andrew Douglas is a 78 y.o. male admitted fevers, presyncope, jaundice, weakness.  Started couple weeks ago.  No abdominal pain.  Imaging dilated bile duct and bilirubin about 20.  History cholangiocarcinoma with prior biliary stent which per patient was removed in late September in anticipation of surgery next week.   Past Medical History:  Diagnosis Date   Hepatitis C    Hypertension     Past Surgical History:  Procedure Laterality Date   BACK SURGERY     a little back problem 20 or 25 years ago   BILIARY DILATION  10/04/2023   Procedure: DILATION, STRICTURE, BILE DUCT;  Surgeon: [name] Kitchens, MD;  Location: [facility];  Service: Gastroenterology;;   CHOLECYSTECTOMY N/A 10/03/2023   Procedure: LAPAROSCOPIC CHOLECYSTECTOMY WITH ICG DYE AND  INTRAOPERATIVE CHOLANGIOGRAM;  Surgeon: Vanderbilt Ned, MD;  Location: [facility];  Service: General;  Laterality: N/A;  ICG   ERCP N/A 10/04/2023   Procedure: ERCP, WITH INTERVENTION IF INDICATED;  Surgeon: [name] Kitchens, MD;  Location: [facility];  Service: Gastroenterology;  Laterality: N/A;   ERCP N/A 11/01/2023   Procedure: ERCP, WITH INTERVENTION IF INDICATED;  Surgeon: [name] Kitchens, MD;  Location: [facility];  Service: Gastroenterology;  Laterality: N/A;   ERCP N/A 04/03/2024   Procedure: ERCP, WITH INTERVENTION IF INDICATED;  Surgeon: [name] Kitchens, MD;  Location: [facility];  Service: Gastroenterology;  Laterality: N/A;  stent removal   ESOPHAGOGASTRODUODENOSCOPY N/A 10/02/2023   Procedure: EGD (ESOPHAGOGASTRODUODENOSCOPY);  Surgeon: Avram Lupita BRAVO, MD;  Location: [facility];  Service: Gastroenterology;  Laterality: N/A;   SPHINCTEROTOMY  10/04/2023   Procedure: SPHINCTEROTOMY, BILIARY;  Surgeon: [name] Kitchens, MD;  Location: [facility];  Service: Gastroenterology;;    Prior to Admission medications   Medication Sig Start Date End Date Taking? Authorizing Provider  ascorbic acid (VITAMIN C) 500 MG tablet Take 500 mg by mouth daily.   Yes [provider]  aspirin EC 81 MG tablet Take 81 mg by mouth daily. Swallow whole.   Yes [provider]  benzonatate  (TESSALON ) 200 MG capsule Take 200 mg by mouth 2 (two) times daily as needed for cough.   Yes [provider]  bimatoprost (LUMIGAN) 0.03 % ophthalmic solution Place 1 drop into both eyes every evening.   Yes [provider]  dorzolamide -timolol  (COSOPT ) 2-0.5 % ophthalmic solution Place 1 drop into both eyes 2 (two) times daily.   Yes [provider]  losartan-hydrochlorothiazide (HYZAAR) 100-12.5 MG tablet Take 1 tablet by mouth daily.   Yes [provider]  Multiple Vitamin (MULTIVITAMIN WITH MINERALS) TABS tablet Take 1 tablet by mouth daily.   Yes [provider]  omeprazole  (PRILOSEC OTC) 20 MG tablet Take 20 mg by mouth daily as needed (for acid reflux). 09/30/23  Yes [provider]  polyethylene glycol powder (GLYCOLAX /MIRALAX ) 17 GM/SCOOP powder Take 17 g by mouth daily as needed for mild constipation or moderate constipation. Mix in 4-8 ounces of liquid as directed. 11/02/23  Yes Amin, Ankit C, MD  Red Yeast Rice Extract (RED YEAST RICE PO) Take 1 capsule by mouth once a week.   Yes [provider]  ondansetron  (ZOFRAN ) 4 MG tablet Take 4 mg by mouth every 8 (eight) hours as needed for nausea or vomiting.    [provider]  Current Facility-Administered Medications  Medication Dose Route Frequency Provider Last Rate Last Admin   acetaminophen  (TYLENOL ) tablet 650 mg  650 mg Oral Q6H PRN Al-Sultani, Anmar, MD       dorzolamide -timolol  (COSOPT ) 2-0.5 % ophthalmic solution 1 drop  1 drop Both Eyes BID Al-Sultani, Anmar, MD       heparin  injection 5,000 Units  5,000 Units  Subcutaneous Q8H Debby Hitch A, MD   5,000 Units at 05/18/24 0624   latanoprost  (XALATAN ) 0.005 % ophthalmic solution 1 drop  1 drop Both Eyes QHS Debby Hitch A, MD   1 drop at 05/17/24 2219   melatonin tablet 3 mg  3 mg Oral QHS Thomas, Sara-Maiz A, MD   3 mg at 05/17/24 2133   piperacillin-tazobactam (ZOSYN) IVPB 3.375 g  3.375 g Intravenous Q8H Al-Sultani, Anmar, MD 12.5 mL/hr at 05/18/24 0624 3.375 g at 05/18/24 9375   polyethylene glycol (MIRALAX  / GLYCOLAX ) packet 17 g  17 g Oral Daily PRN Al-Sultani, Anmar, MD       potassium chloride  SA (KLOR-CON  M) CR tablet 40 mEq  40 mEq Oral Once Al-Sultani, Anmar, MD        Allergies as of 05/16/2024 - Review Complete 05/16/2024  Allergen Reaction Noted   Oxycontin  [oxycodone ]  10/31/2023    History reviewed. No pertinent family history.  Social History   Socioeconomic History   Marital status: Married    Spouse name: Not on file   Number of children: Not on file   Years of education: Not on file   Highest education level: Not on file  Occupational History   Not on file  Tobacco Use   Smoking status: Former    Current packs/day: 1.00    Types: Cigarettes    Passive exposure: Past   Smokeless tobacco: Never   Tobacco comments:    Smoked 20years-1/2-1pk daily  Vaping Use   Vaping status: Never Used  Substance and Sexual Activity   Alcohol use: Not Currently   Drug use: Not Currently   Sexual activity: Not on file  Other Topics Concern   Not on file  Social History Narrative   Not on file   Social Drivers of Health   Financial Resource Strain: Low Risk  (04/19/2022)   Received from [facility]   Overall Financial Resource Strain (CARDIA)    Difficulty of Paying Living Expenses: Not hard at all  Food Insecurity: No Food Insecurity (05/17/2024)   Hunger Vital Sign    Worried About Running Out of Food in the Last Year: Never true    Ran Out of Food in the Last Year: Never true  Transportation Needs: No  Transportation Needs (05/17/2024)   PRAPARE - Administrator, Civil Service (Medical): No    Lack of Transportation (Non-Medical): No  Physical Activity: Not on file  Stress: Not on file  Social Connections: Unknown (05/17/2024)   Social Connection and Isolation Panel    Frequency of Communication with Friends and Family: More than three times a week    Frequency of Social Gatherings with Friends and Family: More than three times a week    Attends Religious Services: More than 4 times per year    Active Member of [employer] or Organizations: Patient declined    Attends Banker Meetings: Patient declined    Marital Status: Married  Catering Manager Violence: Not At Risk (05/17/2024)   Humiliation, Afraid, Rape, and Kick questionnaire    Fear of Current or Ex-Partner:  No    Emotionally Abused: No    Physically Abused: No    Sexually Abused: No    Review of Systems: As per HPI, all others negative  Physical Exam: Vital signs in last 24 hours: Temp:  [97.6 F (36.4 C)-102.9 F (39.4 C)] 98.6 F (37 C) (11/07 0437) Pulse Rate:  [72-111] 72 (11/07 0803) Resp:  [15-18] 17 (11/07 0437) BP: (92-128)/(61-70) 102/69 (11/07 0803) SpO2:  [95 %-100 %] 98 % (11/07 0803) Last BM Date : 05/16/24 General:   Alert,  deconditioned-appearing, weak-appearing but non-toxic Head:  Normocephalic and atraumatic. Eyes:  Sclera icterus bilaterally  Conjunctiva pink. Ears:  Normal auditory acuity. Nose:  No deformity, discharge,  or lesions. Mouth:  No deformity or lesions.  Oropharynx pink & moist. Neck:  Supple; no masses or thyromegaly. Lungs:  No visible respiratory distress Abdomen:  Soft, nontender and nondistended. No masses, hepatosplenomegaly or hernias noted. No peritonitis  Msk:  Symmetrical without gross deformities. Normal posture. Pulses:  Normal pulses noted. Extremities:  Without clubbing or edema. Neurologic:  Alert and  oriented x4;  grossly normal  neurologically. Skin:  Intact without significant lesions or rashes. Psych:  Alert and cooperative. Normal mood and affect.   Lab Results: Recent Labs    05/17/24 0342 05/18/24 0020 05/18/24 0243  WBC 13.4* 17.8* 19.1*  HGB 8.8* 9.3* 9.0*  HCT 24.7* 26.6* 25.6*  PLT 438* 441* 424*   BMET Recent Labs    05/17/24 0342 05/18/24 0020 05/18/24 0243  NA 137 139 141  K 3.1* 3.3* 3.3*  CL 103 105 106  CO2 24 23 22   GLUCOSE 115* 124* 150*  BUN 56* 47* 43*  CREATININE 3.20* 2.70* 2.61*  CALCIUM 7.8* 7.9* 7.9*   LFT Recent Labs    05/18/24 0020 05/18/24 0243  PROT 6.0* 5.9*  ALBUMIN 1.6* <1.5*  AST 64* 55*  ALT 54* 50*  ALKPHOS 299* 276*  BILITOT 18.2* 17.1*  BILIDIR 11.0*  --    PT/INR Recent Labs    05/18/24 0020  LABPROT 17.4*  INR 1.3*    Studies/Results: ECHOCARDIOGRAM COMPLETE Result Date: 05/17/2024    ECHOCARDIOGRAM REPORT   Patient Name:   Andrew Douglas Date of Exam: 05/17/2024 Medical Rec #:  [id number]        Height:       73.0 in Accession #:    [id number]       Weight:       168.0 lb Date of Birth:  Mar 10, 1946        BSA:          1.998 m Patient Age:    78 years         BP:           93/64 mmHg Patient Gender: M                HR:           85 bpm. Exam Location:  Inpatient Procedure: 2D Echo, Cardiac Doppler and Color Doppler (Both Spectral and Color            Flow Doppler were utilized during procedure). Indications:    Syncope  History:        Patient has no prior history of Echocardiogram examinations.                 Signs/Symptoms:Syncope and Hypotension; Risk Factors:Former                 Smoker  and Hypertension.  Sonographer:    Juliene Rucks Referring Phys: [id number] SARA-MAIZ A THOMAS IMPRESSIONS  1. Left ventricular ejection fraction, by estimation, is 60 to 65%. The left ventricle has normal function. The left ventricle has no regional wall motion abnormalities. There is mild concentric left ventricular hypertrophy. Left ventricular diastolic  parameters are consistent with Grade I diastolic dysfunction (impaired relaxation).  2. Right ventricular systolic function is normal. The right ventricular size is normal.  3. The mitral valve is normal in structure. Trivial mitral valve regurgitation. No evidence of mitral stenosis.  4. The aortic valve is normal in structure. Aortic valve regurgitation is not visualized. No aortic stenosis is present.  5. The inferior vena cava is normal in size with greater than 50% respiratory variability, suggesting right atrial pressure of 3 mmHg. FINDINGS  Left Ventricle: Left ventricular ejection fraction, by estimation, is 60 to 65%. The left ventricle has normal function. The left ventricle has no regional wall motion abnormalities. The left ventricular internal cavity size was normal in size. There is  mild concentric left ventricular hypertrophy. Left ventricular diastolic parameters are consistent with Grade I diastolic dysfunction (impaired relaxation). Right Ventricle: The right ventricular size is normal. No increase in right ventricular wall thickness. Right ventricular systolic function is normal. Left At[facility]: Left atrial size was normal in size. Right [facility]: Right atrial size was normal in size. Pericardium: There is no evidence of pericardial effusion. Mitral Valve: The mitral valve is normal in structure. Trivial mitral valve regurgitation. No evidence of mitral valve stenosis. Tricuspid Valve: The tricuspid valve is normal in structure. Tricuspid valve regurgitation is trivial. No evidence of tricuspid stenosis. Aortic Valve: The aortic valve is normal in structure. Aortic valve regurgitation is not visualized. No aortic stenosis is present. Pulmonic Valve: The pulmonic valve was normal in structure. Pulmonic valve regurgitation is trivial. No evidence of pulmonic stenosis. Aorta: The aortic root is normal in size and structure. Venous: The inferior vena cava is normal in size with greater than 50%  respiratory variability, suggesting right atrial pressure of 3 mmHg. IAS/Shunts: No atrial level shunt detected by color flow Doppler.  LEFT VENTRICLE PLAX 2D LVIDd:         4.40 cm   Diastology LVIDs:         3.00 cm   LV e' medial:    8.24 cm/s LV PW:         1.10 cm   LV E/e' medial:  9.8 LV IVS:        1.00 cm   LV e' lateral:   6.84 cm/s LVOT diam:     2.10 cm   LV E/e' lateral: 11.8 LV SV:         68 LV SV Index:   34 LVOT Area:     3.46 cm  RIGHT VENTRICLE             IVC RV Basal diam:  3.60 cm     IVC diam: 1.90 cm RV Mid diam:    3.40 cm RV S prime:     19.70 cm/s TAPSE (M-mode): 1.9 cm LEFT ATRIUM             Index        RIGHT ATRIUM           Index LA diam:        3.00 cm 1.50 cm/m   RA Area:     14.30 cm LA Vol (A2C):   64.7 ml  32.38 ml/m  RA Volume:   31.00 ml  15.52 ml/m LA Vol (A4C):   37.2 ml 18.62 ml/m LA Biplane Vol: 49.5 ml 24.78 ml/m  AORTIC VALVE LVOT Vmax:   100.00 cm/s LVOT Vmean:  68.100 cm/s LVOT VTI:    0.195 m  AORTA Ao Root diam: 3.70 cm Ao Asc diam:  3.40 cm MITRAL VALVE MV Area (PHT): 4.31 cm     SHUNTS MV Decel Time: 176 msec     Systemic VTI:  0.20 m MV E velocity: 80.40 cm/s   Systemic Diam: 2.10 cm MV A velocity: 126.00 cm/s MV E/A ratio:  0.64 Toribio Fuel MD Electronically signed by Toribio Fuel MD Signature Date/Time: 05/17/2024/11:03:28 AM    Final    CT HEAD WO CONTRAST ( ) Result Date: 05/17/2024 EXAM: CT HEAD WITHOUT CONTRAST 05/17/2024 04:06:37 AM TECHNIQUE: CT of the head was performed without the administration of intravenous contrast. Automated exposure control, iterative reconstruction, and/or weight based adjustment of the mA/kV was utilized to reduce the radiation dose to as low as reasonably achievable. COMPARISON: None available. CLINICAL HISTORY: 78 year old male. Syncope/presyncope, cerebrovascular cause suspected. FINDINGS: BRAIN AND VENTRICLES: No acute hemorrhage. Patchy asymmetric hypodensity posterior right lentiform nucleus, representing  mild age indeterminate small vessel disease changes in the right basal ganglia. Otherwise, gray-white differentiation is within normal limits for age. Brain volume is normal for age. No hydrocephalus. No extra-axial collection. No mass effect or midline shift. Calcified atherosclerosis at the skull base. No suspicious intracranial vascular hyperdensity. ORBITS: No acute abnormality. SINUSES: Scattered paranasal sinus mild mucosal thickening. No sinus fluid levels identified. SOFT TISSUES AND SKULL: No acute soft tissue abnormality. No skull fracture. Middle ears and mastoids remain well aerated. IMPRESSION: 1. Mild age-indeterminate small vessel disease changes in the right basal ganglia. Electronically signed by: Helayne Hurst MD 05/17/2024 05:06 AM EST RP Workstation: [id number]   CT ABDOMEN PELVIS WO CONTRAST Result Date: 05/16/2024 CLINICAL DATA:  Abdominal pain EXAM: CT ABDOMEN AND PELVIS WITHOUT CONTRAST TECHNIQUE: Multidetector CT imaging of the abdomen and pelvis was performed following the standard protocol without IV contrast. RADIATION DOSE REDUCTION: This exam was performed according to the departmental dose-optimization program which includes automated exposure control, adjustment of the mA and/or kV according to patient size and/or use of iterative reconstruction technique. COMPARISON:  Chest CT 04/24/2024, MRI 04/12/2024, CT 10/31/2023 FINDINGS: Lower chest: Lung bases demonstrate chronic interstitial lung disease. No acute airspace disease. Hepatobiliary: Cholecystectomy. Moderate intra and extrahepatic biliary dilatation, common bile duct diameter measures 18 mm and may be slightly increased compared to prior. Pancreas: Unremarkable. No pancreatic ductal dilatation or surrounding inflammatory changes. Spleen: Normal in size without focal abnormality. Adrenals/Urinary Tract: Adrenal glands are normal. Right renal cyst for which no imaging follow-up is recommended. No hydronephrosis. The bladder is  unremarkable. Stomach/Bowel: Stomach nonenlarged. No dilated small bowel. No acute bowel wall thickening. Diverticular disease of the colon. Vascular/Lymphatic: Aortic atherosclerosis. No enlarged abdominal or pelvic lymph nodes. Reproductive: Slightly enlarged prostate Other: Negative for ascites or free air. Small fat containing inguinal hernias. Musculoskeletal: No acute osseous abnormality IMPRESSION: 1. No CT evidence for acute intra-abdominal or pelvic abnormality. 2. Chronic interstitial lung disease at the bases. 3. Status post cholecystectomy. Moderate intra and extrahepatic biliary dilatation, common bile duct diameter measures 18 mm and appears slightly increased compared to prior exams. 4. Diverticular disease of the colon without acute inflammatory process. 5. Aortic atherosclerosis. Aortic Atherosclerosis (ICD10-I70.0). Electronically Signed   By: Luke Scott HERO.D.  On: 05/16/2024 20:07   DG Chest Portable 1 View Result Date: 05/16/2024 EXAM: 1 VIEW(S) XRAY OF THE CHEST 05/16/2024 04:57:00 PM COMPARISON: Chest CT dated 04/24/2024. CLINICAL HISTORY: syncope FINDINGS: LUNGS AND PLEURA: Chronic lung changes with pulmonary fibrosis and bronchiectasis are noted on the comparison chest CT dated 04/24/2024. No acute overlying pulmonary process. No pulmonary edema. No pleural effusion. No pneumothorax. HEART AND MEDIASTINUM: No acute abnormality of the cardiac and mediastinal silhouettes. BONES AND SOFT TISSUES: No acute osseous abnormality. IMPRESSION: 1. No acute cardiopulmonary process. 2. 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 Electronically signed by: Maude Stammer MD 05/16/2024 05:10 PM EST RP Workstation: [id number]    Impression:   Fevers and pre-syncope. Cholangiocarcinoma, surgery planned next week. Elevated LFTs. Concern cholangitis; blood pressure SBP low 100s and no fevers since starting antibiotics.  Plan:   Supportive care:  Antibiotics,  IV fluids. Needs ERCP in next day or two; no availability today; will try to arrange for tomorrow. Clear liquid diet ok, NPO after midnight. Awaiting MRCP findings. [facility] will follow.   LOS: 2 days   Maebel Marasco M  05/18/2024, 11:45 AM  Cell [phone] If no answer or after 5 PM call [phone]

## 2024-05-18 NOTE — Plan of Care (Signed)
  Problem: Health Behavior/Discharge Planning: Goal: Ability to manage health-related needs will improve Outcome: Progressing   Problem: Activity: Goal: Risk for activity intolerance will decrease Outcome: Progressing   Problem: Nutrition: Goal: Adequate nutrition will be maintained Outcome: Progressing   

## 2024-05-18 NOTE — Progress Notes (Signed)
 Subjective/Chief Complaint: Still feels pretty weak.  Has not had appetite while jaundiced.    Objective: Vital signs in last 24 hours: Temp:  [97.6 F (36.4 C)-102.9 F (39.4 C)] 98.6 F (37 C) (11/07 0437) Pulse Rate:  [72-111] 72 (11/07 0803) Resp:  [15-18] 17 (11/07 0437) BP: (92-128)/(61-70) 102/69 (11/07 0803) SpO2:  [95 %-100 %] 98 % (11/07 0803) Last BM Date : 05/16/24  Intake/Output from previous day: 11/06 0701 - 11/07 0700 In: 2835.6 [I.V.:2785.6; IV Piggyback:50] Out: 220 [Urine:220] Intake/Output this shift: Total I/O In: -  Out: 1000 [Urine:1000]  Gen: alert and oriented.   CV regular Resp:  breathing comfortably Abd: soft, non tender, non distended  Lab Results:  Recent Labs    05/18/24 0020 05/18/24 0243  WBC 17.8* 19.1*  HGB 9.3* 9.0*  HCT 26.6* 25.6*  PLT 441* 424*   BMET Recent Labs    05/18/24 0020 05/18/24 0243  NA 139 141  K 3.3* 3.3*  CL 105 106  CO2 23 22  GLUCOSE 124* 150*  BUN 47* 43*  CREATININE 2.70* 2.61*  CALCIUM 7.9* 7.9*   PT/INR Recent Labs    05/18/24 0020  LABPROT 17.4*  INR 1.3*   ABG No results for input(s): PHART, HCO3 in the last 72 hours.  Invalid input(s): PCO2, PO2  Studies/Results: ECHOCARDIOGRAM COMPLETE Result Date: 05/17/2024    ECHOCARDIOGRAM REPORT   Patient Name:   Andrew Douglas Date of Exam: 05/17/2024 Medical Rec #:  [id number]        Height:       73.0 in Accession #:    [id number]       Weight:       168.0 lb Date of Birth:  Oct 09, 1945        BSA:          1.998 m Patient Age:    78 years         BP:           93/64 mmHg Patient Gender: M                HR:           85 bpm. Exam Location:  Inpatient Procedure: 2D Echo, Cardiac Doppler and Color Doppler (Both Spectral and Color            Flow Doppler were utilized during procedure). Indications:    Syncope  History:        Patient has no prior history of Echocardiogram examinations.                 Signs/Symptoms:Syncope and  Hypotension; Risk Factors:Former                 Smoker and Hypertension.  Sonographer:    Juliene Rucks Referring Phys: [id number] SARA-MAIZ A THOMAS IMPRESSIONS  1. Left ventricular ejection fraction, by estimation, is 60 to 65%. The left ventricle has normal function. The left ventricle has no regional wall motion abnormalities. There is mild concentric left ventricular hypertrophy. Left ventricular diastolic parameters are consistent with Grade I diastolic dysfunction (impaired relaxation).  2. Right ventricular systolic function is normal. The right ventricular size is normal.  3. The mitral valve is normal in structure. Trivial mitral valve regurgitation. No evidence of mitral stenosis.  4. The aortic valve is normal in structure. Aortic valve regurgitation is not visualized. No aortic stenosis is present.  5. The inferior vena cava is normal in size with greater than 50% respiratory  variability, suggesting right atrial pressure of 3 mmHg. FINDINGS  Left Ventricle: Left ventricular ejection fraction, by estimation, is 60 to 65%. The left ventricle has normal function. The left ventricle has no regional wall motion abnormalities. The left ventricular internal cavity size was normal in size. There is  mild concentric left ventricular hypertrophy. Left ventricular diastolic parameters are consistent with Grade I diastolic dysfunction (impaired relaxation). Right Ventricle: The right ventricular size is normal. No increase in right ventricular wall thickness. Right ventricular systolic function is normal. Left Atrium: Left atrial size was normal in size. Right Atrium: Right atrial size was normal in size. Pericardium: There is no evidence of pericardial effusion. Mitral Valve: The mitral valve is normal in structure. Trivial mitral valve regurgitation. No evidence of mitral valve stenosis. Tricuspid Valve: The tricuspid valve is normal in structure. Tricuspid valve regurgitation is trivial. No evidence of tricuspid  stenosis. Aortic Valve: The aortic valve is normal in structure. Aortic valve regurgitation is not visualized. No aortic stenosis is present. Pulmonic Valve: The pulmonic valve was normal in structure. Pulmonic valve regurgitation is trivial. No evidence of pulmonic stenosis. Aorta: The aortic root is normal in size and structure. Venous: The inferior vena cava is normal in size with greater than 50% respiratory variability, suggesting right atrial pressure of 3 mmHg. IAS/Shunts: No atrial level shunt detected by color flow Doppler.  LEFT VENTRICLE PLAX 2D LVIDd:         4.40 cm   Diastology LVIDs:         3.00 cm   LV e' medial:    8.24 cm/s LV PW:         1.10 cm   LV E/e' medial:  9.8 LV IVS:        1.00 cm   LV e' lateral:   6.84 cm/s LVOT diam:     2.10 cm   LV E/e' lateral: 11.8 LV SV:         68 LV SV Index:   34 LVOT Area:     3.46 cm  RIGHT VENTRICLE             IVC RV Basal diam:  3.60 cm     IVC diam: 1.90 cm RV Mid diam:    3.40 cm RV S prime:     19.70 cm/s TAPSE (M-mode): 1.9 cm LEFT ATRIUM             Index        RIGHT ATRIUM           Index LA diam:        3.00 cm 1.50 cm/m   RA Area:     14.30 cm LA Vol (A2C):   64.7 ml 32.38 ml/m  RA Volume:   31.00 ml  15.52 ml/m LA Vol (A4C):   37.2 ml 18.62 ml/m LA Biplane Vol: 49.5 ml 24.78 ml/m  AORTIC VALVE LVOT Vmax:   100.00 cm/s LVOT Vmean:  68.100 cm/s LVOT VTI:    0.195 m  AORTA Ao Root diam: 3.70 cm Ao Asc diam:  3.40 cm MITRAL VALVE MV Area (PHT): 4.31 cm     SHUNTS MV Decel Time: 176 msec     Systemic VTI:  0.20 m MV E velocity: 80.40 cm/s   Systemic Diam: 2.10 cm MV A velocity: 126.00 cm/s MV E/A ratio:  0.64 Toribio Fuel MD Electronically signed by Toribio Fuel MD Signature Date/Time: 05/17/2024/11:03:28 AM    Final    CT HEAD  WO CONTRAST ( ) Result Date: 05/17/2024 EXAM: CT HEAD WITHOUT CONTRAST 05/17/2024 04:06:37 AM TECHNIQUE: CT of the head was performed without the administration of intravenous contrast. Automated exposure  control, iterative reconstruction, and/or weight based adjustment of the mA/kV was utilized to reduce the radiation dose to as low as reasonably achievable. COMPARISON: None available. CLINICAL HISTORY: 78 year old male. Syncope/presyncope, cerebrovascular cause suspected. FINDINGS: BRAIN AND VENTRICLES: No acute hemorrhage. Patchy asymmetric hypodensity posterior right lentiform nucleus, representing mild age indeterminate small vessel disease changes in the right basal ganglia. Otherwise, gray-white differentiation is within normal limits for age. Brain volume is normal for age. No hydrocephalus. No extra-axial collection. No mass effect or midline shift. Calcified atherosclerosis at the skull base. No suspicious intracranial vascular hyperdensity. ORBITS: No acute abnormality. SINUSES: Scattered paranasal sinus mild mucosal thickening. No sinus fluid levels identified. SOFT TISSUES AND SKULL: No acute soft tissue abnormality. No skull fracture. Middle ears and mastoids remain well aerated. IMPRESSION: 1. Mild age-indeterminate small vessel disease changes in the right basal ganglia. Electronically signed by: Helayne Hurst MD 05/17/2024 05:06 AM EST RP Workstation: [id number]   CT ABDOMEN PELVIS WO CONTRAST Result Date: 05/16/2024 CLINICAL DATA:  Abdominal pain EXAM: CT ABDOMEN AND PELVIS WITHOUT CONTRAST TECHNIQUE: Multidetector CT imaging of the abdomen and pelvis was performed following the standard protocol without IV contrast. RADIATION DOSE REDUCTION: This exam was performed according to the departmental dose-optimization program which includes automated exposure control, adjustment of the mA and/or kV according to patient size and/or use of iterative reconstruction technique. COMPARISON:  Chest CT 04/24/2024, MRI 04/12/2024, CT 10/31/2023 FINDINGS: Lower chest: Lung bases demonstrate chronic interstitial lung disease. No acute airspace disease. Hepatobiliary: Cholecystectomy. Moderate intra and  extrahepatic biliary dilatation, common bile duct diameter measures 18 mm and may be slightly increased compared to prior. Pancreas: Unremarkable. No pancreatic ductal dilatation or surrounding inflammatory changes. Spleen: Normal in size without focal abnormality. Adrenals/Urinary Tract: Adrenal glands are normal. Right renal cyst for which no imaging follow-up is recommended. No hydronephrosis. The bladder is unremarkable. Stomach/Bowel: Stomach nonenlarged. No dilated small bowel. No acute bowel wall thickening. Diverticular disease of the colon. Vascular/Lymphatic: Aortic atherosclerosis. No enlarged abdominal or pelvic lymph nodes. Reproductive: Slightly enlarged prostate Other: Negative for ascites or free air. Small fat containing inguinal hernias. Musculoskeletal: No acute osseous abnormality IMPRESSION: 1. No CT evidence for acute intra-abdominal or pelvic abnormality. 2. Chronic interstitial lung disease at the bases. 3. Status post cholecystectomy. Moderate intra and extrahepatic biliary dilatation, common bile duct diameter measures 18 mm and appears slightly increased compared to prior exams. 4. Diverticular disease of the colon without acute inflammatory process. 5. Aortic atherosclerosis. Aortic Atherosclerosis (ICD10-I70.0). Electronically Signed   By: Luke Bun M.D.   On: 05/16/2024 20:07   DG Chest Portable 1 View Result Date: 05/16/2024 EXAM: 1 VIEW(S) XRAY OF THE CHEST 05/16/2024 04:57:00 PM COMPARISON: Chest CT dated 04/24/2024. CLINICAL HISTORY: syncope FINDINGS: LUNGS AND PLEURA: Chronic lung changes with pulmonary fibrosis and bronchiectasis are noted on the comparison chest CT dated 04/24/2024. No acute overlying pulmonary process. No pulmonary edema. No pleural effusion. No pneumothorax. HEART AND MEDIASTINUM: No acute abnormality of the cardiac and mediastinal silhouettes. BONES AND SOFT TISSUES: No acute osseous abnormality. IMPRESSION: 1. No acute cardiopulmonary process. 2.  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 Electronically signed by: Maude Stammer MD 05/16/2024 05:10 PM EST RP Workstation: [id number]    Anti-infectives: Anti-infectives (From admission, onward)    Start  Dose/Rate Route Frequency Ordered Stop   05/18/24 0600  piperacillin-tazobactam (ZOSYN) IVPB 3.375 g        3.375 g 12.5 mL/hr over 240 Minutes Intravenous Every 8 hours 05/17/24 2232     05/17/24 2330  piperacillin-tazobactam (ZOSYN) IVPB 3.375 g        3.375 g 100 mL/hr over 30 Minutes Intravenous  Once 05/17/24 2231 05/18/24 0147       Assessment/Plan: Distal cholangiocarcinoma Malignant biliary obstruction jaundice Severe protein calorie malnutrition Acute kidney injury Anemia of chronic disease, ? Chronic blood loss anemia as well.   Discussed with GI. Will need repeat stent prior to whipple.  Operative complications much higher if T bili >8. Continue hydration for AKI I am also concerned about his anemia and overall nutritional status.    Will recheck labs Monday after ERCP.  ? Tomorrow.  GI working on scheduling.   Discussed with patient and wife that surgery might need to be delayed.       LOS: 2 days    Jina LITTIE Nephew, MD, FACS, FSSO Surgical Oncology, General Surgery, Trauma and Critical [facility], [place] [phone] for weekday/non holidays Check [email or web address] for coverage night/weekend/holidays under General Surgery

## 2024-05-18 NOTE — Progress Notes (Signed)
 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  Per the patient's report, he was feeling weak for 4-5 days with overall decreased p.o. intake and poor appetite for several weeks.  He notes that he was at a nail salon getting a pedicure when the technician worked on a very tender area with an ingrown toenail that causes excruciating pain and ended up having a syncopal episode only preceded by visual changes he described asthe things on the wall became animated but denied chest pain, SOB, nausea, dizziness, lightheadedness. Syncope last ~ 30 seconds after which he was able to respond to questions, with no postictal state, tongue biting, or incontinence reported. Per ED documentation, the patient was hypotensive with SBP in the 80s on EMS's arrival. He received a bolus of LR with improvement in BP to 90/50 and subjective improvement in symptoms.   In the ED, he was afebrile with a temp of 97.9 F, RR 10, HR 91, BP 89/59, SpO2 100% on RA.  CBC was significant for leukocytosis to 15.1, Hgb 10.7, PLTs 495.  CMP was remarkable for hypokalemia to 2.8, BUN 64, creatinine 3.7 (baseline 0.7-1), albumin 1.9, AST 68, ALT 59, alk phos 312, T. bili 19.6.  Lipase 18.  BNP 46.  UA showed moderate bilirubin and rare bacteria.  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  CT abdomen pelvis without contrast showed no evidence of acute intra-abdominal or pelvic abnormality, but noted patient is status postcholecystectomy with moderate intra and extrahepatic biliary  dilatation, and CBD measurement of 18 mm appearing slightly increased compared to prior exams.  In the ED, it was felt like his syncope was vasovagal in nature with contribution from dehydration due to decreased p.o. intake that improved significantly after receiving 2 L NS. However, he was admitted due to AKI.  Assessment and Plan:  # Cholangiocarcinoma s/p biliary stent placement then removal and cholecystectomy # Concern for acute ascending cholangitis - Recently diagnosed with cholangiocarcinoma, per chart review, appears to have had a biliary stent place but ultimately removed in 03/2024 by Dr. [name] - Patient was evaluated by general surgery and scheduled for Whipple procedure on 05/24/2024 with Dr. Byerly - CT A/P showed CBD dilation to 18 mm appearing slightly increased compared to prior - T.bili noted to be elevated to 19.6 on admission, improved to 17.6 today - Notably, patient had a fever of 102.9 F this evening though RN noted patient was covered with white 6 blankets with room temp to 80 F.  On repeat 1 hour later was 100.6 F - Blood cultures NGTD - Continue IV Zosyn  - GI following, Dr. Burnette evaluated patient, recommending ERCP tomorrow - Surgery following, Dr. [name] evaluated patient, deferring clinical procedure until after ERCP and reduction in T. bili  # AKI  - Cr elevated to 3.70 on admission, elevated from baseline of ~ 0.7-1.0 - Likely secondary to hypotension prior to admission with concomitant dehydration from several days of decreased PO intake  - Improving slowly with IVFs - Cr 2.61 today - Continue IVFs NS 100 mL/hr  - Encourage p.o. 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 No  - Improved with IVFs with no recurrence of episodes - CT head showed mild age-indeterminate small vessel disease changes in the right basal ganglia - Echo (11/6) showed LVEF 60-65%, no RWMA, trivial MR, no AS, AR, or MS - No focal neurologic deficits noted on exam  # Hypertension - Currently low normal blood pressures without medications   DVT prophylaxis: heparin  injection 5,000 Units Start: 05/16/24 2200   Code Status:   Code Status: Full Code  Family Communication: None  Disposition Plan: Home PT Follow up Recs:   Level of care: Telemetry  Consultants:  Gastroenterology, general surgery  Procedures:  None  Antimicrobials: IV Zosyn  Subjective: Patient examined at bedside today.  Patient's wife was also at bedside.  Patient continues to report feeling well without any abdominal pain, nausea, vomiting, presyncope, or syncopal episodes.  Does not have a big appetite and has not been eating much.  Pending ERCP tomorrow.  Objective: Vitals:   05/18/24 0437 05/18/24 0803 05/18/24 1643 05/18/24 2034  BP: 92/61 102/69 122/70 104/70  Pulse: 79 72 74 85  Resp: 17  18 20   Temp: 98.6 F (37 C)  98 F (36.7 C) 98.3 F (36.8 C)  TempSrc: Oral   Oral  SpO2: 97% 98% 97% 99%  Weight:      Height:        Intake/Output Summary (Last 24 hours) at 05/18/2024 2156 Last data filed at 05/18/2024 1615 Gross per 24 hour  Intake 2905.15 ml  Output 1220 ml  Net 1685.15 ml   Filed Weights   05/16/24 1642 05/17/24 0112  Weight: 74.8 kg 76.2 kg    Examination:  General exam: Appears calm and comfortable Eyes: Bilateral icteric sclera Respiratory system: Clear to auscultation. Respiratory effort normal. Cardiovascular system: S1 & S2 heard, RRR. No JVD, murmurs. No pedal edema. Gastrointestinal system: Abdomen is nondistended, soft and  nontender. No organomegaly or masses felt. Normal bowel sounds heard. Central nervous system: Alert and oriented. No focal neurological deficits. Extremities: Symmetric 5 x 5 power. Skin: Jaundiced.  No rashes, lesions or ulcers Psychiatry: Judgement and insight appear normal. Mood & affect appropriate.     Data Reviewed: I have personally reviewed following labs and imaging studies  CBC: Recent Labs  Lab 05/16/24 1645 05/16/24 1852 05/17/24 0342 05/18/24 0020 05/18/24 0243  WBC 15.1*  --  13.4* 17.8* 19.1*  NEUTROABS 12.7*  --   --   --   --   HGB 10.7* 9.5* 8.8* 9.3* 9.0*  HCT 31.0* 28.0* 24.7* 26.6* 25.6*  MCV 81.8  --  79.9* 79.9* 80.3  PLT 495*  --  438* 441* 424*   Basic Metabolic Panel: Recent Labs  Lab 05/16/24 1645 05/16/24 1852 05/16/24 2357 05/17/24 0342 05/18/24 0020 05/18/24 0243  NA 135 140  --  137 139 141  K 2.8* 2.6*  --  3.1* 3.3* 3.3*  CL 97* 101  --  103 105 106  CO2 23  --   --  24 23 22   GLUCOSE  148* 113*  --  115* 124* 150*  BUN 64* 48*  --  56* 47* 43*  CREATININE 3.70* 3.30* 3.19* 3.20* 2.70* 2.61*  CALCIUM 8.4*  --   --  7.8* 7.9* 7.9*  MG  --   --  1.9  --   --   --    GFR: Estimated Creatinine Clearance: 24.7 mL/min (A) (by C-G formula based on SCr of 2.61 mg/dL (H)). Liver Function Tests: Recent Labs  Lab 05/16/24 1645 05/17/24 0342 05/18/24 0020 05/18/24 0243  AST 68* 53* 64* 55*  ALT 59* 52* 54* 50*  ALKPHOS 312* 266* 299* 276*  BILITOT 19.6* 17.6* 18.2* 17.1*  PROT 6.6 6.1* 6.0* 5.9*  ALBUMIN 1.9* <1.5* 1.6* <1.5*   Recent Labs  Lab 05/16/24 1645  LIPASE 18   No results for input(s): AMMONIA in the last 168 hours. Coagulation Profile: Recent Labs  Lab 05/18/24 0020  INR 1.3*   Cardiac Enzymes: No results for input(s): CKTOTAL, CKMB, CKMBINDEX, TROPONINI in the last 168 hours. BNP (last 3 results) No results for input(s): PROBNP in the last 8760 hours. HbA1C: No results for input(s): HGBA1C in  the last 72 hours. CBG: No results for input(s): GLUCAP in the last 168 hours. Lipid Profile: No results for input(s): CHOL, HDL, LDLCALC, TRIG, CHOLHDL, LDLDIRECT in the last 72 hours. Thyroid Function Tests: No results for input(s): TSH, T4TOTAL, FREET4, T3FREE, THYROIDAB in the last 72 hours. Anemia Panel: Recent Labs    05/18/24 0243 05/18/24 1606  VITAMINB12  --  1,321*  FOLATE >20.0  --   FERRITIN  --  1,035*  TIBC  --  169*  IRON  --  34*  RETICCTPCT 1.3  --    Sepsis Labs: Recent Labs  Lab 05/16/24 1852 05/17/24 0758  PROCALCITON  --  1.88  LATICACIDVEN 0.9 0.8    Recent Results (from the past 240 hours)  Urine Culture (for pregnant, neutropenic or urologic patients or patients with an indwelling urinary catheter)     Status: Abnormal (Preliminary result)   Collection Time: 05/17/24  1:21 AM   Specimen: Urine, Clean Catch  Result Value Ref Range Status   Specimen Description URINE, CLEAN CATCH  Final   Special Requests NONE  Final   Culture (A)  Final    >=100,000 COLONIES/mL ENTEROCOCCUS FAECALIS SUSCEPTIBILITIES TO FOLLOW Performed at [facility], [address] [place], [place] [address]    Report Status PENDING  Incomplete  Culture, blood (Routine X 2) w Reflex to ID Panel     Status: None (Preliminary result)   Collection Time: 05/17/24  9:33 PM   Specimen: BLOOD  Result Value Ref Range Status   Specimen Description BLOOD SITE NOT SPECIFIED  Final   Special Requests   Final    BOTTLES DRAWN AEROBIC AND ANAEROBIC Blood Culture adequate volume   Culture   Final    NO GROWTH < 12 HOURS Performed at [facility], [address] [place], [place] [address]    Report Status PENDING  Incomplete  Culture, blood (Routine X 2) w Reflex to ID Panel     Status: None (Preliminary result)   Collection Time: 05/17/24  9:38 PM   Specimen: BLOOD  Result Value Ref Range Status   Specimen Description BLOOD SITE NOT SPECIFIED   Final   Special Requests   Final    BOTTLES DRAWN AEROBIC AND ANAEROBIC Blood Culture adequate volume   Culture   Final  NO GROWTH < 12 HOURS Performed at [facility], [address], [place], [place] [address]    Report Status PENDING  Incomplete     Radiology Studies: MR ABDOMEN MRCP W WO CONTAST Result Date: 05/18/2024 CLINICAL DATA:  History of cholangiocarcinoma with jaundice EXAM: MRI ABDOMEN WITHOUT AND WITH CONTRAST (INCLUDING MRCP) TECHNIQUE: Multiplanar multisequence MR imaging of the abdomen was performed both before and after the administration of intravenous contrast. Heavily T2-weighted images of the biliary and pancreatic ducts were obtained. Post-processing was applied at the acquisition scanner with concurrent physician supervision which includes 3D reconstructions, MIPs, volume rendered images and/or shaded surface rendering. CONTRAST:  7mL GADAVIST  GADOBUTROL  1 MMOL/ML IV SOLN COMPARISON:  CT abdomen and pelvis dated 05/16/2024, MRI abdomen dated 04/12/2024 FINDINGS: Lower chest: Trace left pleural effusion. Hepatobiliary: Scattered simple-appearing cysts. Previously noted segment 5 arterially enhancing focus is not seen. Severe intra and extrahepatic bile duct dilation, increased compared to 04/12/2024 with the common bile duct measuring up to 1.7 cm, previously 1.1 cm. Again, there is tapering at the level of ampulla, slightly more abrupt compared to 04/12/2024. Cholecystectomy. Pancreas: Similar to slightly increased diffuse main pancreatic ductal dilation measuring up to 5 mm throughout, previously 4 mm in the body/tail. No focal pancreatic lesion or inflammatory changes. Spleen:  Within normal limits in size and appearance. Adrenals/Urinary Tract: No adrenal nodules. No suspicious renal masses identified. No evidence of hydronephrosis. Unchanged simple exophytic upper pole renal cyst. Stomach/Bowel: Colonic diverticulosis without acute diverticulitis. Vascular/Lymphatic:  No pathologically enlarged lymph nodes identified. No abdominal aortic aneurysm demonstrated. Other:  None. Musculoskeletal: No suspicious bone lesions identified. IMPRESSION: 1. Increased severe intra and extrahepatic bile duct dilation with tapering at the level of ampulla, slightly more abrupt compared to 04/12/2024, at the site of known cholangiocarcinoma. 2. Similar to slightly increased diffuse main pancreatic ductal dilation. 3. Trace left pleural effusion. Electronically Signed   By: Limin  Xu M.D.   On: 05/18/2024 15:03   MR 3D Recon At Scanner Result Date: 05/18/2024 CLINICAL DATA:  History of cholangiocarcinoma with jaundice EXAM: MRI ABDOMEN WITHOUT AND WITH CONTRAST (INCLUDING MRCP) TECHNIQUE: Multiplanar multisequence MR imaging of the abdomen was performed both before and after the administration of intravenous contrast. Heavily T2-weighted images of the biliary and pancreatic ducts were obtained. Post-processing was applied at the acquisition scanner with concurrent physician supervision which includes 3D reconstructions, MIPs, volume rendered images and/or shaded surface rendering. CONTRAST:  7mL GADAVIST  GADOBUTROL  1 MMOL/ML IV SOLN COMPARISON:  CT abdomen and pelvis dated 05/16/2024, MRI abdomen dated 04/12/2024 FINDINGS: Lower chest: Trace left pleural effusion. Hepatobiliary: Scattered simple-appearing cysts. Previously noted segment 5 arterially enhancing focus is not seen. Severe intra and extrahepatic bile duct dilation, increased compared to 04/12/2024 with the common bile duct measuring up to 1.7 cm, previously 1.1 cm. Again, there is tapering at the level of ampulla, slightly more abrupt compared to 04/12/2024. Cholecystectomy. Pancreas: Similar to slightly increased diffuse main pancreatic ductal dilation measuring up to 5 mm throughout, previously 4 mm in the body/tail. No focal pancreatic lesion or inflammatory changes. Spleen:  Within normal limits in size and appearance.  Adrenals/Urinary Tract: No adrenal nodules. No suspicious renal masses identified. No evidence of hydronephrosis. Unchanged simple exophytic upper pole renal cyst. Stomach/Bowel: Colonic diverticulosis without acute diverticulitis. Vascular/Lymphatic: No pathologically enlarged lymph nodes identified. No abdominal aortic aneurysm demonstrated. Other:  None. Musculoskeletal: No suspicious bone lesions identified. IMPRESSION: 1. Increased severe intra and extrahepatic bile duct dilation with tapering at  the level of ampulla, slightly more abrupt compared to 04/12/2024, at the site of known cholangiocarcinoma. 2. Similar to slightly increased diffuse main pancreatic ductal dilation. 3. Trace left pleural effusion. Electronically Signed   By: Limin  Xu M.D.   On: 05/18/2024 15:03   ECHOCARDIOGRAM COMPLETE Result Date: 05/17/2024    ECHOCARDIOGRAM REPORT   Patient Name:   NAKHI CHOI Date of Exam: 05/17/2024 Medical Rec #:  [id number]        Height:       73.0 in Accession #:    [id number]       Weight:       168.0 lb Date of Birth:  July 13, 1945        BSA:          1.998 m Patient Age:    78 years         BP:           93/64 mmHg Patient Gender: M                HR:           85 bpm. Exam Location:  Inpatient Procedure: 2D Echo, Cardiac Doppler and Color Doppler (Both Spectral and Color            Flow Doppler were utilized during procedure). Indications:    Syncope  History:        Patient has no prior history of Echocardiogram examinations.                 Signs/Symptoms:Syncope and Hypotension; Risk Factors:Former                 Smoker and Hypertension.  Sonographer:    Juliene Rucks Referring Phys: [id number] SARA-MAIZ A THOMAS IMPRESSIONS  1. Left ventricular ejection fraction, by estimation, is 60 to 65%. The left ventricle has normal function. The left ventricle has no regional wall motion abnormalities. There is mild concentric left ventricular hypertrophy. Left ventricular diastolic parameters are consistent  with Grade I diastolic dysfunction (impaired relaxation).  2. Right ventricular systolic function is normal. The right ventricular size is normal.  3. The mitral valve is normal in structure. Trivial mitral valve regurgitation. No evidence of mitral stenosis.  4. The aortic valve is normal in structure. Aortic valve regurgitation is not visualized. No aortic stenosis is present.  5. The inferior vena cava is normal in size with greater than 50% respiratory variability, suggesting right atrial pressure of 3 mmHg. FINDINGS  Left Ventricle: Left ventricular ejection fraction, by estimation, is 60 to 65%. The left ventricle has normal function. The left ventricle has no regional wall motion abnormalities. The left ventricular internal cavity size was normal in size. There is  mild concentric left ventricular hypertrophy. Left ventricular diastolic parameters are consistent with Grade I diastolic dysfunction (impaired relaxation). Right Ventricle: The right ventricular size is normal. No increase in right ventricular wall thickness. Right ventricular systolic function is normal. Left Atrium: Left atrial size was normal in size. Right Atrium: Right atrial size was normal in size. Pericardium: There is no evidence of pericardial effusion. Mitral Valve: The mitral valve is normal in structure. Trivial mitral valve regurgitation. No evidence of mitral valve stenosis. Tricuspid Valve: The tricuspid valve is normal in structure. Tricuspid valve regurgitation is trivial. No evidence of tricuspid stenosis. Aortic Valve: The aortic valve is normal in structure. Aortic valve regurgitation is not visualized. No aortic stenosis is present. Pulmonic Valve: The pulmonic valve was normal in structure.  Pulmonic valve regurgitation is trivial. No evidence of pulmonic stenosis. Aorta: The aortic root is normal in size and structure. Venous: The inferior vena cava is normal in size with greater than 50% respiratory variability, suggesting  right atrial pressure of 3 mmHg. IAS/Shunts: No atrial level shunt detected by color flow Doppler.  LEFT VENTRICLE PLAX 2D LVIDd:         4.40 cm   Diastology LVIDs:         3.00 cm   LV e' medial:    8.24 cm/s LV PW:         1.10 cm   LV E/e' medial:  9.8 LV IVS:        1.00 cm   LV e' lateral:   6.84 cm/s LVOT diam:     2.10 cm   LV E/e' lateral: 11.8 LV SV:         68 LV SV Index:   34 LVOT Area:     3.46 cm  RIGHT VENTRICLE             IVC RV Basal diam:  3.60 cm     IVC diam: 1.90 cm RV Mid diam:    3.40 cm RV S prime:     19.70 cm/s TAPSE (M-mode): 1.9 cm LEFT ATRIUM             Index        RIGHT ATRIUM           Index LA diam:        3.00 cm 1.50 cm/m   RA Area:     14.30 cm LA Vol (A2C):   64.7 ml 32.38 ml/m  RA Volume:   31.00 ml  15.52 ml/m LA Vol (A4C):   37.2 ml 18.62 ml/m LA Biplane Vol: 49.5 ml 24.78 ml/m  AORTIC VALVE LVOT Vmax:   100.00 cm/s LVOT Vmean:  68.100 cm/s LVOT VTI:    0.195 m  AORTA Ao Root diam: 3.70 cm Ao Asc diam:  3.40 cm MITRAL VALVE MV Area (PHT): 4.31 cm     SHUNTS MV Decel Time: 176 msec     Systemic VTI:  0.20 m MV E velocity: 80.40 cm/s   Systemic Diam: 2.10 cm MV A velocity: 126.00 cm/s MV E/A ratio:  0.64 Toribio Fuel MD Electronically signed by Toribio Fuel MD Signature Date/Time: 05/17/2024/11:03:28 AM    Final    CT HEAD WO CONTRAST ( ) Result Date: 05/17/2024 EXAM: CT HEAD WITHOUT CONTRAST 05/17/2024 04:06:37 AM TECHNIQUE: CT of the head was performed without the administration of intravenous contrast. Automated exposure control, iterative reconstruction, and/or weight based adjustment of the mA/kV was utilized to reduce the radiation dose to as low as reasonably achievable. COMPARISON: None available. CLINICAL HISTORY: 78 year old male. Syncope/presyncope, cerebrovascular cause suspected. FINDINGS: BRAIN AND VENTRICLES: No acute hemorrhage. Patchy asymmetric hypodensity posterior right lentiform nucleus, representing mild age indeterminate small vessel  disease changes in the right basal ganglia. Otherwise, gray-white differentiation is within normal limits for age. Brain volume is normal for age. No hydrocephalus. No extra-axial collection. No mass effect or midline shift. Calcified atherosclerosis at the skull base. No suspicious intracranial vascular hyperdensity. ORBITS: No acute abnormality. SINUSES: Scattered paranasal sinus mild mucosal thickening. No sinus fluid levels identified. SOFT TISSUES AND SKULL: No acute soft tissue abnormality. No skull fracture. Middle ears and mastoids remain well aerated. IMPRESSION: 1. Mild age-indeterminate small vessel disease changes in the right basal ganglia. Electronically signed by: Helayne  Hall MD 05/17/2024 05:06 AM EST RP Workstation: [id number]    Scheduled Meds:  dorzolamide -timolol   1 drop Both Eyes BID   heparin   5,000 Units Subcutaneous Q8H   latanoprost   1 drop Both Eyes QHS   melatonin  3 mg Oral QHS   Continuous Infusions:  piperacillin-tazobactam (ZOSYN)  IV 3.375 g (05/18/24 2146)     LOS:  LOS: 2 days   Time Spent: 45 minutes  Unresulted Labs (From admission, onward)     Start     Ordered   05/19/24 0500  Prealbumin  Tomorrow morning,   R        05/18/24 1409   05/18/24 0500  CBC  Daily,   R      05/17/24 2233   05/18/24 0500  Comprehensive metabolic panel with GFR  Daily,   R      05/17/24 2233             Joia Doyle Al-Sultani, MD [facility]  If 7PM-7AM, please contact night-coverage  05/18/2024, 9:56 PM

## 2024-05-19 DIAGNOSIS — R55 Syncope and collapse: Secondary | ICD-10-CM | POA: Diagnosis not present

## 2024-05-19 DIAGNOSIS — B952 Enterococcus as the cause of diseases classified elsewhere: Secondary | ICD-10-CM

## 2024-05-19 DIAGNOSIS — D72829 Elevated white blood cell count, unspecified: Secondary | ICD-10-CM | POA: Diagnosis not present

## 2024-05-19 DIAGNOSIS — R8271 Bacteriuria: Secondary | ICD-10-CM

## 2024-05-19 DIAGNOSIS — C229 Malignant neoplasm of liver, not specified as primary or secondary: Secondary | ICD-10-CM

## 2024-05-19 DIAGNOSIS — K8309 Other cholangitis: Secondary | ICD-10-CM

## 2024-05-19 LAB — PREALBUMIN: Prealbumin: <5 mg/dL — ABNORMAL LOW (ref 18–38)

## 2024-05-19 LAB — BLOOD CULTURE ID PANEL (REFLEXED) - BCID2

## 2024-05-19 LAB — COMPREHENSIVE METABOLIC PANEL WITH GFR
ALT: 50 U/L — ABNORMAL HIGH (ref 0–44)
AST: 57 U/L — ABNORMAL HIGH (ref 15–41)
Albumin: <1.5 g/dL — ABNORMAL LOW (ref 3.5–5.0)
Alkaline Phosphatase: 255 U/L — ABNORMAL HIGH (ref 38–126)
Anion gap: 12 (ref 5–15)
BUN: 34 mg/dL — ABNORMAL HIGH (ref 8–23)
CO2: 23 mmol/L (ref 22–32)
Calcium: 8.3 mg/dL — ABNORMAL LOW (ref 8.9–10.3)
Chloride: 110 mmol/L (ref 98–111)
Creatinine, Ser: 2.28 mg/dL — ABNORMAL HIGH (ref 0.61–1.24)
GFR, Estimated: 28 mL/min — ABNORMAL LOW (ref >60)
Glucose, Bld: 95 mg/dL (ref 70–99)
Potassium: 3.1 mmol/L — ABNORMAL LOW (ref 3.5–5.1)
Sodium: 145 mmol/L (ref 135–145)
Total Bilirubin: 14.8 mg/dL — ABNORMAL HIGH (ref 0.0–1.2)
Total Protein: 6.1 g/dL — ABNORMAL LOW (ref 6.5–8.1)

## 2024-05-19 LAB — CBC
HCT: 25.7 % — ABNORMAL LOW (ref 39.0–52.0)
Hemoglobin: 9.2 g/dL — ABNORMAL LOW (ref 13.0–17.0)
MCH: 28.4 pg (ref 26.0–34.0)
MCHC: 35.8 g/dL (ref 30.0–36.0)
MCV: 79.3 fL — ABNORMAL LOW (ref 80.0–100.0)
Platelets: 414 K/uL — ABNORMAL HIGH (ref 150–400)
RBC: 3.24 MIL/uL — ABNORMAL LOW (ref 4.22–5.81)
RDW: 15.3 % (ref 11.5–15.5)
WBC: 17 K/uL — ABNORMAL HIGH (ref 4.0–10.5)
nRBC: 0 % (ref 0.0–0.2)

## 2024-05-19 LAB — URINE CULTURE: Culture: >=100000 — AB

## 2024-05-19 LAB — MAGNESIUM: Magnesium: 1.7 mg/dL (ref 1.7–2.4)

## 2024-05-19 MED ORDER — ENSURE PLUS HIGH PROTEIN PO LIQD
237.0000 mL | Freq: Two times a day (BID) | ORAL | Status: DC
  Administered 2024-05-21 (×2): 237 mL via ORAL

## 2024-05-19 MED ORDER — POTASSIUM CHLORIDE CRYS ER 20 MEQ PO TBCR
40.0000 meq | EXTENDED_RELEASE_TABLET | ORAL | Status: AC
  Administered 2024-05-19 (×2): 40 meq via ORAL
  Filled 2024-05-19 (×2): qty 2

## 2024-05-19 NOTE — H&P (View-Only) (Signed)
 [facility] Progress Note  SUBJECTIVE:   Interval history: Andrew Douglas was seen and evaluated today at bedside. Resting comfortably in bed with spouse at bedside. Tolerating clear liquid diet. Denied further fevers/chills since admission/IV antibiotics. No abdominal pain, shortness of breath, chest pain. Understanding of plans for ERCP tomorrow.  Past Medical History:  Diagnosis Date   Hepatitis C    Hypertension    Past Surgical History:  Procedure Laterality Date   BACK SURGERY     a little back problem 20 or 25 years ago   BILIARY DILATION  10/04/2023   Procedure: DILATION, STRICTURE, BILE DUCT;  Surgeon: [name] Kitchens, MD;  Location: [facility];  Service: Gastroenterology;;   CHOLECYSTECTOMY N/A 10/03/2023   Procedure: LAPAROSCOPIC CHOLECYSTECTOMY WITH ICG DYE AND  INTRAOPERATIVE CHOLANGIOGRAM;  Surgeon: Vanderbilt Ned, MD;  Location: [facility];  Service: General;  Laterality: N/A;  ICG   ERCP N/A 10/04/2023   Procedure: ERCP, WITH INTERVENTION IF INDICATED;  Surgeon: [name] Kitchens, MD;  Location: [facility];  Service: Gastroenterology;  Laterality: N/A;   ERCP N/A 11/01/2023   Procedure: ERCP, WITH INTERVENTION IF INDICATED;  Surgeon: [name] Kitchens, MD;  Location: [facility];  Service: Gastroenterology;  Laterality: N/A;   ERCP N/A 04/03/2024   Procedure: ERCP, WITH INTERVENTION IF INDICATED;  Surgeon: [name] Kitchens, MD;  Location: [facility];  Service: Gastroenterology;  Laterality: N/A;  stent removal   ESOPHAGOGASTRODUODENOSCOPY N/A 10/02/2023   Procedure: EGD (ESOPHAGOGASTRODUODENOSCOPY);  Surgeon: Avram Lupita BRAVO, MD;  Location: [facility];  Service: Gastroenterology;  Laterality: N/A;   SPHINCTEROTOMY  10/04/2023   Procedure: SPHINCTEROTOMY, BILIARY;  Surgeon: [name] Kitchens, MD;  Location: [facility];  Service: Gastroenterology;;   Current Facility-Administered Medications  Medication Dose Route Frequency Provider Last Rate Last Admin   0.9 %  sodium chloride   infusion   Intravenous Continuous Al-Sultani, Anmar, MD 100 mL/hr at 05/19/24 0926 New Bag at 05/19/24 0926   acetaminophen  (TYLENOL ) tablet 650 mg  650 mg Oral Q6H PRN Al-Sultani, Anmar, MD       dorzolamide -timolol  (COSOPT ) 2-0.5 % ophthalmic solution 1 drop  1 drop Both Eyes BID Al-Sultani, Anmar, MD   1 drop at 05/19/24 0916   heparin  injection 5,000 Units  5,000 Units Subcutaneous Q8H Ned Hitch A, MD   5,000 Units at 05/19/24 1353   latanoprost  (XALATAN ) 0.005 % ophthalmic solution 1 drop  1 drop Both Eyes QHS Ned Hitch A, MD   1 drop at 05/18/24 2147   melatonin tablet 3 mg  3 mg Oral QHS Thomas, Sara-Maiz A, MD   3 mg at 05/18/24 2144   piperacillin-tazobactam (ZOSYN) IVPB 3.375 g  3.375 g Intravenous Q8H Al-Sultani, Anmar, MD 12.5 mL/hr at 05/19/24 1353 3.375 g at 05/19/24 1353   polyethylene glycol (MIRALAX  / GLYCOLAX ) packet 17 g  17 g Oral Daily PRN Al-Sultani, Anmar, MD       potassium chloride  SA (KLOR-CON  M) CR tablet 40 mEq  40 mEq Oral Q4H Al-Sultani, Anmar, MD   40 mEq at 05/19/24 1138   Allergies as of 05/16/2024 - Review Complete 05/16/2024  Allergen Reaction Noted   Oxycontin  [oxycodone ]  10/31/2023   Review of Systems:  Review of Systems  Constitutional:  Negative for chills and fever.  Respiratory:  Negative for shortness of breath.   Cardiovascular:  Negative for chest pain.  Gastrointestinal:  Negative for abdominal pain, nausea and vomiting.    OBJECTIVE:   Temp:  [97.7 F (36.5 C)-98.3 F (36.8 C)] 97.7 F (36.5  C) (11/08 0846) Pulse Rate:  [63-85] 63 (11/08 0846) Resp:  [18-20] 18 (11/08 0846) BP: (103-122)/(65-72) 114/72 (11/08 0846) SpO2:  [97 %-100 %] 100 % (11/08 0846) Last BM Date : 05/18/24 Physical Exam HENT:     Mouth/Throat:     Comments: Sublingual icterus  Cardiovascular:     Rate and Rhythm: Normal rate and regular rhythm.  Pulmonary:     Effort: No respiratory distress.     Breath sounds: Normal breath sounds.   Abdominal:     General: Bowel sounds are normal. There is no distension.     Palpations: Abdomen is soft.     Tenderness: There is no abdominal tenderness. There is no guarding.     Labs: Recent Labs    05/18/24 0020 05/18/24 0243 05/19/24 0459  WBC 17.8* 19.1* 17.0*  HGB 9.3* 9.0* 9.2*  HCT 26.6* 25.6* 25.7*  PLT 441* 424* 414*   BMET Recent Labs    05/18/24 0020 05/18/24 0243 05/19/24 0459  NA 139 141 145  K 3.3* 3.3* 3.1*  CL 105 106 110  CO2 23 22 23   GLUCOSE 124* 150* 95  BUN 47* 43* 34*  CREATININE 2.70* 2.61* 2.28*  CALCIUM 7.9* 7.9* 8.3*   LFT Recent Labs    05/18/24 0020 05/18/24 0243 05/19/24 0459  PROT 6.0*   < > 6.1*  ALBUMIN 1.6*   < > <1.5*  AST 64*   < > 57*  ALT 54*   < > 50*  ALKPHOS 299*   < > 255*  BILITOT 18.2*   < > 14.8*  BILIDIR 11.0*  --   --    < > = values in this interval not displayed.   PT/INR Recent Labs    05/18/24 0020  LABPROT 17.4*  INR 1.3*   Diagnostic imaging: MR ABDOMEN MRCP W WO CONTAST Result Date: 05/18/2024 CLINICAL DATA:  History of cholangiocarcinoma with jaundice EXAM: MRI ABDOMEN WITHOUT AND WITH CONTRAST (INCLUDING MRCP) TECHNIQUE: Multiplanar multisequence MR imaging of the abdomen was performed both before and after the administration of intravenous contrast. Heavily T2-weighted images of the biliary and pancreatic ducts were obtained. Post-processing was applied at the acquisition scanner with concurrent physician supervision which includes 3D reconstructions, MIPs, volume rendered images and/or shaded surface rendering. CONTRAST:  7mL GADAVIST  GADOBUTROL  1 MMOL/ML IV SOLN COMPARISON:  CT abdomen and pelvis dated 05/16/2024, MRI abdomen dated 04/12/2024 FINDINGS: Lower chest: Trace left pleural effusion. Hepatobiliary: Scattered simple-appearing cysts. Previously noted segment 5 arterially enhancing focus is not seen. Severe intra and extrahepatic bile duct dilation, increased compared to 04/12/2024 with the  common bile duct measuring up to 1.7 cm, previously 1.1 cm. Again, there is tapering at the level of ampulla, slightly more abrupt compared to 04/12/2024. Cholecystectomy. Pancreas: Similar to slightly increased diffuse main pancreatic ductal dilation measuring up to 5 mm throughout, previously 4 mm in the body/tail. No focal pancreatic lesion or inflammatory changes. Spleen:  Within normal limits in size and appearance. Adrenals/Urinary Tract: No adrenal nodules. No suspicious renal masses identified. No evidence of hydronephrosis. Unchanged simple exophytic upper pole renal cyst. Stomach/Bowel: Colonic diverticulosis without acute diverticulitis. Vascular/Lymphatic: No pathologically enlarged lymph nodes identified. No abdominal aortic aneurysm demonstrated. Other:  None. Musculoskeletal: No suspicious bone lesions identified. IMPRESSION: 1. Increased severe intra and extrahepatic bile duct dilation with tapering at the level of ampulla, slightly more abrupt compared to 04/12/2024, at the site of known cholangiocarcinoma. 2. Similar to slightly increased diffuse main pancreatic ductal dilation.  3. Trace left pleural effusion. Electronically Signed   By: Limin  Xu M.D.   On: 05/18/2024 15:03   MR 3D Recon At Scanner Result Date: 05/18/2024 CLINICAL DATA:  History of cholangiocarcinoma with jaundice EXAM: MRI ABDOMEN WITHOUT AND WITH CONTRAST (INCLUDING MRCP) TECHNIQUE: Multiplanar multisequence MR imaging of the abdomen was performed both before and after the administration of intravenous contrast. Heavily T2-weighted images of the biliary and pancreatic ducts were obtained. Post-processing was applied at the acquisition scanner with concurrent physician supervision which includes 3D reconstructions, MIPs, volume rendered images and/or shaded surface rendering. CONTRAST:  7mL GADAVIST  GADOBUTROL  1 MMOL/ML IV SOLN COMPARISON:  CT abdomen and pelvis dated 05/16/2024, MRI abdomen dated 04/12/2024 FINDINGS: Lower  chest: Trace left pleural effusion. Hepatobiliary: Scattered simple-appearing cysts. Previously noted segment 5 arterially enhancing focus is not seen. Severe intra and extrahepatic bile duct dilation, increased compared to 04/12/2024 with the common bile duct measuring up to 1.7 cm, previously 1.1 cm. Again, there is tapering at the level of ampulla, slightly more abrupt compared to 04/12/2024. Cholecystectomy. Pancreas: Similar to slightly increased diffuse main pancreatic ductal dilation measuring up to 5 mm throughout, previously 4 mm in the body/tail. No focal pancreatic lesion or inflammatory changes. Spleen:  Within normal limits in size and appearance. Adrenals/Urinary Tract: No adrenal nodules. No suspicious renal masses identified. No evidence of hydronephrosis. Unchanged simple exophytic upper pole renal cyst. Stomach/Bowel: Colonic diverticulosis without acute diverticulitis. Vascular/Lymphatic: No pathologically enlarged lymph nodes identified. No abdominal aortic aneurysm demonstrated. Other:  None. Musculoskeletal: No suspicious bone lesions identified. IMPRESSION: 1. Increased severe intra and extrahepatic bile duct dilation with tapering at the level of ampulla, slightly more abrupt compared to 04/12/2024, at the site of known cholangiocarcinoma. 2. Similar to slightly increased diffuse main pancreatic ductal dilation. 3. Trace left pleural effusion. Electronically Signed   By: Limin  Xu M.D.   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 Avram tomorrow    LOS: 3 days   Estefana Keas, DO [facility]

## 2024-05-19 NOTE — Progress Notes (Signed)
 PHARMACY - PHYSICIAN COMMUNICATION CRITICAL VALUE ALERT - BLOOD CULTURE IDENTIFICATION (BCID)  Andrew Douglas is an 78 y.o. 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 Kenard   Current antibiotics: Zosyn 3.375gm IV every 8 hours   Changes to prescribed antibiotics recommended:   -Continue current course  Results for orders placed or performed during the hospital encounter of 05/16/24  Blood Culture ID Panel (Reflexed) (Collected: 05/17/2024  9:33 PM)  Result Value Ref Range   Enterococcus faecalis NOT DETECTED NOT DETECTED   Enterococcus Faecium NOT DETECTED NOT DETECTED   Listeria monocytogenes NOT DETECTED NOT DETECTED   Staphylococcus species NOT DETECTED NOT DETECTED   Staphylococcus aureus (BCID) NOT DETECTED NOT DETECTED   Staphylococcus epidermidis NOT DETECTED NOT DETECTED   Staphylococcus lugdunensis NOT DETECTED NOT DETECTED   Streptococcus species NOT DETECTED NOT DETECTED   Streptococcus agalactiae NOT DETECTED NOT DETECTED   Streptococcus pneumoniae NOT DETECTED NOT DETECTED   Streptococcus pyogenes NOT DETECTED NOT DETECTED   A.calcoaceticus-baumannii NOT DETECTED NOT DETECTED   Bacteroides fragilis NOT DETECTED NOT DETECTED   Enterobacterales NOT DETECTED NOT DETECTED   Enterobacter cloacae complex NOT DETECTED NOT DETECTED   Escherichia coli NOT DETECTED NOT DETECTED   Klebsiella aerogenes NOT DETECTED NOT DETECTED   Klebsiella oxytoca NOT DETECTED NOT DETECTED   Klebsiella pneumoniae NOT DETECTED NOT DETECTED   Proteus species NOT DETECTED NOT DETECTED   Salmonella species NOT DETECTED NOT DETECTED   Serratia marcescens NOT DETECTED NOT DETECTED   Haemophilus influenzae NOT DETECTED NOT DETECTED   Neisseria  meningitidis NOT DETECTED NOT DETECTED   Pseudomonas aeruginosa DETECTED (A) NOT DETECTED   Stenotrophomonas maltophilia NOT DETECTED NOT DETECTED   Candida albicans NOT DETECTED NOT DETECTED   Candida auris NOT DETECTED NOT DETECTED   Candida glabrata NOT DETECTED NOT DETECTED   Candida krusei NOT DETECTED NOT DETECTED   Candida parapsilosis NOT DETECTED NOT DETECTED   Candida tropicalis NOT DETECTED NOT DETECTED   Cryptococcus neoformans/gattii NOT DETECTED NOT DETECTED   CTX-M ESBL NOT DETECTED NOT DETECTED   Carbapenem resistance IMP NOT DETECTED NOT DETECTED   Carbapenem resistance KPC NOT DETECTED NOT DETECTED   Carbapenem resistance NDM NOT DETECTED NOT DETECTED   Carbapenem resistance VIM NOT DETECTED NOT DETECTED    Lynwood Poplar, PharmD, BCPS Clinical Pharmacist 05/19/2024 7:04 AM

## 2024-05-19 NOTE — Progress Notes (Signed)
 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  Per the patient's report, he was feeling weak for 4-5 days with overall decreased p.o. intake and poor appetite for several weeks.  He notes that he was at a nail salon getting a pedicure when the technician worked on a very tender area with an ingrown toenail that causes excruciating pain and ended up having a syncopal episode only preceded by visual changes he described asthe things on the wall became animated but denied chest pain, SOB, nausea, dizziness, lightheadedness. Syncope last ~ 30 seconds after which he was able to respond to questions, with no postictal state, tongue biting, or incontinence reported. Per ED documentation, the patient was hypotensive with SBP in the 80s on EMS's arrival. He received a bolus of LR with improvement in BP to 90/50 and subjective improvement in symptoms.   In the ED, he was afebrile with a temp of 97.9 F, RR 10, HR 91, BP 89/59, SpO2 100% on RA.  CBC was significant for leukocytosis to 15.1, Hgb 10.7, PLTs 495.  CMP was remarkable for hypokalemia to 2.8, BUN 64, creatinine 3.7 (baseline 0.7-1), albumin 1.9, AST 68, ALT 59, alk phos 312, T. bili 19.6.  Lipase 18.  BNP 46.  UA showed moderate bilirubin and rare bacteria.  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  CT abdomen pelvis without contrast showed no evidence of acute intra-abdominal or pelvic abnormality, but noted patient is status postcholecystectomy with moderate intra and extrahepatic biliary  dilatation, and CBD measurement of 18 mm appearing slightly increased compared to prior exams.  In the ED, it was felt like his syncope was vasovagal in nature with contribution from dehydration due to decreased p.o. intake that improved significantly after receiving 2 L NS. However, he was admitted due to AKI.  Assessment and Plan:  # Cholangiocarcinoma s/p biliary stent placement then removal and cholecystectomy # Acute cholangitis - Recently diagnosed with cholangiocarcinoma, per chart review, appears to have had a biliary stent place but ultimately removed in 03/2024 by Dr. [name] - Patient was evaluated by general surgery and originally scheduled for Whipple procedure on 05/24/2024 with Dr. Byerly - CT A/P showed CBD dilation to 18 mm appearing slightly increased compared to prior - T.bili improving to 14.8 today - Fevers resolved, leukocytosis improving - Blood cultures positive for Pseudomonas aeruginosa, susceptibilities pending - Continue IV Zosyn  - ID following - Surgery following, Dr. [name] evaluated patient, deferring clinical procedure until after ERCP and reduction in T. bili - GI following, unable to schedule ERCP today, plans for ERCP tomorrow  # AKI  - Cr elevated to 3.70 on admission, elevated from baseline of ~ 0.7-1.0 - Likely secondary to hypotension prior to admission with concomitant dehydration from several days of decreased PO intake  - Improving slowly with IVFs - Cr 2.28 today - Continue IVFs NS 100 mL/hr  - Encourage p.o. 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 No  - Improved with IVFs with no recurrence of episodes - CT head showed mild age-indeterminate small vessel disease changes in the right basal ganglia - Echo (11/6) showed LVEF 60-65%, no RWMA, trivial MR, no AS, AR, or MS - No focal neurologic deficits noted on exam  #Severe hypoalbuminemia - Albumin less than 1.5, prealbumin less than 5 - Concern for malnutrition in setting of cholangiocarcinoma and cholangitis - Dietitian consulted  # Hypertension - Currently low normal blood pressures without medications   DVT prophylaxis: heparin  injection 5,000 Units Start: 05/16/24 2200   Code Status:   Code Status: Full Code  Family Communication: None  Disposition Plan: Home PT Follow up Recs:   Level of care: Telemetry  Consultants:  Gastroenterology, general surgery, infectious disease  Procedures:  None  Antimicrobials: IV Zosyn  Subjective: Patient examined at bedside today.  Patient's wife was also at bedside.  Patient continues to report feeling well without any abdominal pain, nausea, vomiting, presyncope, or syncopal episodes.  Does not have a big appetite and has not been eating much.  Pending ERCP tomorrow.  Objective: Vitals:   05/18/24 2034 05/19/24 0427 05/19/24 0846 05/19/24 1727  BP: 104/70 103/65 114/72 112/70  Pulse: 85 63 63 63  Resp: 20 20 18 18   Temp: 98.3 F (36.8 C) 97.8 F (36.6 C) 97.7 F (36.5 C) 98 F (36.7 C)  TempSrc: Oral Oral    SpO2: 99% 97% 100% 98%  Weight:      Height:        Intake/Output Summary (Last 24 hours) at 05/19/2024 1823 Last data filed at 05/19/2024 1508 Gross per 24 hour  Intake 2654.98 ml  Output 1325 ml  Net 1329.98 ml   Filed Weights   05/16/24 1642 05/17/24 0112  Weight: 74.8 kg 76.2 kg    Examination:  General exam: Appears calm and comfortable Eyes: Bilateral icteric sclera Respiratory system: Clear to auscultation. Respiratory effort normal. Cardiovascular system: S1 & S2  heard, RRR. No JVD, murmurs. No pedal edema. Gastrointestinal system: Abdomen is nondistended, soft and nontender. No organomegaly or masses felt. Normal bowel sounds heard. Central nervous system: Alert and oriented. No focal neurological deficits. Extremities: Symmetric 5 x 5 strength. Skin: Jaundiced.  No rashes, lesions or ulcers Psychiatry: Judgement and insight appear normal. Mood & affect appropriate.     Data Reviewed: I have personally reviewed following labs and imaging studies  CBC: Recent Labs  Lab 05/16/24 1645 05/16/24 1852 05/17/24 0342 05/18/24 0020 05/18/24 0243 05/19/24 0459  WBC 15.1*  --  13.4* 17.8* 19.1* 17.0*  NEUTROABS 12.7*  --   --   --   --   --   HGB 10.7* 9.5* 8.8* 9.3* 9.0* 9.2*  HCT 31.0* 28.0* 24.7* 26.6* 25.6* 25.7*  MCV 81.8  --  79.9* 79.9* 80.3 79.3*  PLT 495*  --  438* 441* 424* 414*   Basic Metabolic Panel: Recent Labs  Lab 05/16/24 1645 05/16/24 1852 05/16/24 2357 05/17/24 0342 05/18/24 0020 05/18/24 0243 05/19/24 0459  NA 135 140  --  137 139 141 145  K 2.8* 2.6*  --  3.1* 3.3* 3.3* 3.1*  CL 97* 101  --  103 105 106 110  CO2 23  --   --  24 23 22 23   GLUCOSE 148* 113*  --  115* 124* 150* 95  BUN 64* 48*  --  56* 47* 43* 34*  CREATININE 3.70* 3.30* 3.19* 3.20* 2.70* 2.61* 2.28*  CALCIUM 8.4*  --   --  7.8* 7.9* 7.9* 8.3*  MG  --   --  1.9  --   --   --  1.7   GFR: Estimated Creatinine Clearance: 28.3 mL/min (A) (by C-G formula based on SCr of 2.28 mg/dL (H)). Liver Function Tests: Recent Labs  Lab 05/16/24 1645 05/17/24 0342 05/18/24 0020 05/18/24 0243 05/19/24 0459  AST 68* 53* 64* 55* 57*  ALT 59* 52* 54* 50* 50*  ALKPHOS 312* 266* 299* 276* 255*  BILITOT 19.6* 17.6* 18.2* 17.1* 14.8*  PROT 6.6 6.1* 6.0* 5.9* 6.1*  ALBUMIN 1.9* <1.5* 1.6* <1.5* <1.5*   Recent Labs  Lab 05/16/24 1645  LIPASE 18   No results for input(s): AMMONIA in the last 168 hours. Coagulation Profile: Recent Labs  Lab 05/18/24 0020   INR 1.3*   Cardiac Enzymes: No results for input(s): CKTOTAL, CKMB, CKMBINDEX, TROPONINI in the last 168 hours. BNP (last 3 results) No results for input(s): PROBNP in the last 8760 hours. HbA1C: No results for input(s): HGBA1C in the last 72 hours. CBG: No results for input(s): GLUCAP in the last 168 hours. Lipid Profile: No results for input(s): CHOL, HDL, LDLCALC, TRIG, CHOLHDL, LDLDIRECT in the last 72 hours. Thyroid Function Tests: No results for input(s): TSH, T4TOTAL, FREET4, T3FREE, THYROIDAB in the last 72 hours. Anemia Panel: Recent Labs    05/18/24 0243 05/18/24 1606  VITAMINB12  --  1,321*  FOLATE >20.0  --   FERRITIN  --  1,035*  TIBC  --  169*  IRON  --  34*  RETICCTPCT 1.3  --    Sepsis Labs: Recent Labs  Lab 05/16/24 1852 05/17/24 0758  PROCALCITON  --  1.88  LATICACIDVEN 0.9 0.8    Recent Results (from the past 240 hours)  Urine Culture (for pregnant, neutropenic or urologic patients or patients with an indwelling urinary catheter)     Status: Abnormal   Collection Time: 05/17/24  1:21 AM   Specimen: Urine, Clean Catch  Result Value Ref Range Status   Specimen Description URINE, CLEAN CATCH  Final   Special Requests   Final    NONE Performed at [facility], [address] [place], [place] [address]    Culture >=100,000 COLONIES/mL ENTEROCOCCUS FAECALIS (A)  Final   Report Status 05/19/2024 FINAL  Final   Organism ID, Bacteria ENTEROCOCCUS FAECALIS (A)  Final      Susceptibility   Enterococcus faecalis - MIC*    AMPICILLIN  <=2 SENSITIVE Sensitive     NITROFURANTOIN <=16 SENSITIVE Sensitive     VANCOMYCIN 1 SENSITIVE Sensitive     * >=100,000 COLONIES/mL ENTEROCOCCUS FAECALIS  Culture, blood (Routine X 2) w Reflex to ID Panel     Status: None (Preliminary result)   Collection Time: 05/17/24  9:33 PM   Specimen: BLOOD  Result Value Ref Range Status   Specimen Description BLOOD SITE NOT SPECIFIED   Final   Special Requests   Final    BOTTLES DRAWN AEROBIC AND ANAEROBIC Blood Culture adequate volume   Culture  Setup Time   Final    GRAM NEGATIVE RODS AEROBIC BOTTLE ONLY RBV [place] PHARMD 05/19/2024 @0647  BY DD Performed at [facility],  [address], [place], [place] [address]    Culture GRAM NEGATIVE RODS  Final   Report Status PENDING  Incomplete  Blood Culture ID Panel (Reflexed)     Status: Abnormal   Collection Time: 05/17/24  9:33 PM  Result Value Ref Range Status   Enterococcus faecalis NOT DETECTED NOT DETECTED Final   Enterococcus Faecium NOT DETECTED NOT DETECTED Final   Listeria monocytogenes NOT DETECTED NOT DETECTED Final   Staphylococcus species NOT DETECTED NOT DETECTED Final   Staphylococcus aureus (BCID) NOT DETECTED NOT DETECTED Final   Staphylococcus epidermidis NOT DETECTED NOT DETECTED Final   Staphylococcus lugdunensis NOT DETECTED NOT DETECTED Final   Streptococcus species NOT DETECTED NOT DETECTED Final   Streptococcus agalactiae NOT DETECTED NOT DETECTED Final   Streptococcus pneumoniae NOT DETECTED NOT DETECTED Final   Streptococcus pyogenes NOT DETECTED NOT DETECTED Final   A.calcoaceticus-baumannii NOT DETECTED NOT DETECTED Final   Bacteroides fragilis NOT DETECTED NOT DETECTED Final   Enterobacterales NOT DETECTED NOT DETECTED Final   Enterobacter cloacae complex NOT DETECTED NOT DETECTED Final   Escherichia coli NOT DETECTED NOT DETECTED Final   Klebsiella aerogenes NOT DETECTED NOT DETECTED Final   Klebsiella oxytoca NOT DETECTED NOT DETECTED Final   Klebsiella pneumoniae NOT DETECTED NOT DETECTED Final   Proteus species NOT DETECTED NOT DETECTED Final   Salmonella species NOT DETECTED NOT DETECTED Final   Serratia marcescens NOT DETECTED NOT DETECTED Final   Haemophilus influenzae NOT DETECTED NOT DETECTED Final   Neisseria meningitidis NOT DETECTED NOT DETECTED Final   Pseudomonas aeruginosa DETECTED (A) NOT DETECTED Final     Comment: CRITICAL RESULT CALLED TO, READ BACK BY AND VERIFIED WITH: RBV [place] PHARMD 05/19/2024 @0647  BY DD    Stenotrophomonas maltophilia NOT DETECTED NOT DETECTED Final   Candida albicans NOT DETECTED NOT DETECTED Final   Candida auris NOT DETECTED NOT DETECTED Final   Candida glabrata NOT DETECTED NOT DETECTED Final   Candida krusei NOT DETECTED NOT DETECTED Final   Candida parapsilosis NOT DETECTED NOT DETECTED Final   Candida tropicalis NOT DETECTED NOT DETECTED Final   Cryptococcus neoformans/gattii NOT DETECTED NOT DETECTED Final   CTX-M ESBL NOT DETECTED NOT DETECTED Final   Carbapenem resistance IMP NOT DETECTED NOT DETECTED Final   Carbapenem resistance KPC NOT DETECTED NOT DETECTED Final   Carbapenem resistance NDM NOT DETECTED NOT DETECTED Final   Carbapenem resistance VIM NOT DETECTED NOT DETECTED Final    Comment: Performed at [facility], [address] [place], [place] [address]  Culture, blood (Routine X 2) w Reflex to ID Panel     Status: None (Preliminary result)   Collection Time: 05/17/24  9:38 PM   Specimen: BLOOD  Result Value Ref Range Status   Specimen Description BLOOD SITE NOT SPECIFIED  Final   Special Requests   Final    BOTTLES DRAWN AEROBIC AND ANAEROBIC Blood Culture adequate volume   Culture  Setup Time   Final    GRAM NEGATIVE RODS AEROBIC BOTTLE ONLY CRITICAL VALUE NOTED.  VALUE IS CONSISTENT WITH PREVIOUSLY REPORTED AND CALLED VALUE.    Culture   Final    NO GROWTH 2 DAYS Performed at [facility], [address], [place], [place] [address]    Report Status PENDING  Incomplete     Radiology Studies: MR ABDOMEN MRCP W WO CONTAST Result Date: 05/18/2024 CLINICAL DATA:  History of cholangiocarcinoma with jaundice EXAM: MRI ABDOMEN WITHOUT AND WITH CONTRAST (INCLUDING MRCP) TECHNIQUE:  Multiplanar multisequence MR imaging of the abdomen was performed both before and after the administration of intravenous contrast. Heavily  T2-weighted images of the biliary and pancreatic ducts were obtained. Post-processing was applied at the acquisition scanner with concurrent physician supervision which includes 3D reconstructions, MIPs, volume rendered images and/or shaded surface rendering. CONTRAST:  7mL GADAVIST  GADOBUTROL  1 MMOL/ML IV SOLN COMPARISON:  CT abdomen and pelvis dated 05/16/2024, MRI abdomen dated 04/12/2024 FINDINGS: Lower chest: Trace left pleural effusion. Hepatobiliary: Scattered simple-appearing cysts. Previously noted segment 5 arterially enhancing focus is not seen. Severe intra and extrahepatic bile duct dilation, increased compared to 04/12/2024 with the common bile duct measuring up to 1.7 cm, previously 1.1 cm. Again, there is tapering at the level of ampulla, slightly more abrupt compared to 04/12/2024. Cholecystectomy. Pancreas: Similar to slightly increased diffuse main pancreatic ductal dilation measuring up to 5 mm throughout, previously 4 mm in the body/tail. No focal pancreatic lesion or inflammatory changes. Spleen:  Within normal limits in size and appearance. Adrenals/Urinary Tract: No adrenal nodules. No suspicious renal masses identified. No evidence of hydronephrosis. Unchanged simple exophytic upper pole renal cyst. Stomach/Bowel: Colonic diverticulosis without acute diverticulitis. Vascular/Lymphatic: No pathologically enlarged lymph nodes identified. No abdominal aortic aneurysm demonstrated. Other:  None. Musculoskeletal: No suspicious bone lesions identified. IMPRESSION: 1. Increased severe intra and extrahepatic bile duct dilation with tapering at the level of ampulla, slightly more abrupt compared to 04/12/2024, at the site of known cholangiocarcinoma. 2. Similar to slightly increased diffuse main pancreatic ductal dilation. 3. Trace left pleural effusion. Electronically Signed   By: Limin  Xu M.D.   On: 05/18/2024 15:03   MR 3D Recon At Scanner Result Date: 05/18/2024 CLINICAL DATA:  History of  cholangiocarcinoma with jaundice EXAM: MRI ABDOMEN WITHOUT AND WITH CONTRAST (INCLUDING MRCP) TECHNIQUE: Multiplanar multisequence MR imaging of the abdomen was performed both before and after the administration of intravenous contrast. Heavily T2-weighted images of the biliary and pancreatic ducts were obtained. Post-processing was applied at the acquisition scanner with concurrent physician supervision which includes 3D reconstructions, MIPs, volume rendered images and/or shaded surface rendering. CONTRAST:  7mL GADAVIST  GADOBUTROL  1 MMOL/ML IV SOLN COMPARISON:  CT abdomen and pelvis dated 05/16/2024, MRI abdomen dated 04/12/2024 FINDINGS: Lower chest: Trace left pleural effusion. Hepatobiliary: Scattered simple-appearing cysts. Previously noted segment 5 arterially enhancing focus is not seen. Severe intra and extrahepatic bile duct dilation, increased compared to 04/12/2024 with the common bile duct measuring up to 1.7 cm, previously 1.1 cm. Again, there is tapering at the level of ampulla, slightly more abrupt compared to 04/12/2024. Cholecystectomy. Pancreas: Similar to slightly increased diffuse main pancreatic ductal dilation measuring up to 5 mm throughout, previously 4 mm in the body/tail. No focal pancreatic lesion or inflammatory changes. Spleen:  Within normal limits in size and appearance. Adrenals/Urinary Tract: No adrenal nodules. No suspicious renal masses identified. No evidence of hydronephrosis. Unchanged simple exophytic upper pole renal cyst. Stomach/Bowel: Colonic diverticulosis without acute diverticulitis. Vascular/Lymphatic: No pathologically enlarged lymph nodes identified. No abdominal aortic aneurysm demonstrated. Other:  None. Musculoskeletal: No suspicious bone lesions identified. IMPRESSION: 1. Increased severe intra and extrahepatic bile duct dilation with tapering at the level of ampulla, slightly more abrupt compared to 04/12/2024, at the site of known cholangiocarcinoma. 2.  Similar to slightly increased diffuse main pancreatic ductal dilation. 3. Trace left pleural effusion. Electronically Signed   By: Limin  Xu M.D.   On: 05/18/2024 15:03    Scheduled Meds:  dorzolamide -timolol   1 drop Both Eyes BID  heparin   5,000 Units Subcutaneous Q8H   latanoprost   1 drop Both Eyes QHS   melatonin  3 mg Oral QHS   Continuous Infusions:  sodium chloride  100 mL/hr at 05/19/24 0926   piperacillin-tazobactam (ZOSYN)  IV 3.375 g (05/19/24 1353)     LOS:  LOS: 3 days   Time Spent: 35 minutes  Unresulted Labs (From admission, onward)     Start     Ordered   05/18/24 0500  CBC  Daily,   R      05/17/24 2233   05/18/24 0500  Comprehensive metabolic panel with GFR  Daily,   R      05/17/24 2233             Imara Standiford Al-Sultani, MD [facility]  If 7PM-7AM, please contact night-coverage  05/19/2024, 6:23 PM

## 2024-05-19 NOTE — Progress Notes (Signed)
 [facility] Progress Note  SUBJECTIVE:   Interval history: Andrew Douglas was seen and evaluated today at bedside. Resting comfortably in bed with spouse at bedside. Tolerating clear liquid diet. Denied further fevers/chills since admission/IV antibiotics. No abdominal pain, shortness of breath, chest pain. Understanding of plans for ERCP tomorrow.  Past Medical History:  Diagnosis Date   Hepatitis C    Hypertension    Past Surgical History:  Procedure Laterality Date   BACK SURGERY     a little back problem 20 or 25 years ago   BILIARY DILATION  10/04/2023   Procedure: DILATION, STRICTURE, BILE DUCT;  Surgeon: [name] Kitchens, MD;  Location: [facility];  Service: Gastroenterology;;   CHOLECYSTECTOMY N/A 10/03/2023   Procedure: LAPAROSCOPIC CHOLECYSTECTOMY WITH ICG DYE AND  INTRAOPERATIVE CHOLANGIOGRAM;  Surgeon: Vanderbilt Ned, MD;  Location: [facility];  Service: General;  Laterality: N/A;  ICG   ERCP N/A 10/04/2023   Procedure: ERCP, WITH INTERVENTION IF INDICATED;  Surgeon: [name] Kitchens, MD;  Location: [facility];  Service: Gastroenterology;  Laterality: N/A;   ERCP N/A 11/01/2023   Procedure: ERCP, WITH INTERVENTION IF INDICATED;  Surgeon: [name] Kitchens, MD;  Location: [facility];  Service: Gastroenterology;  Laterality: N/A;   ERCP N/A 04/03/2024   Procedure: ERCP, WITH INTERVENTION IF INDICATED;  Surgeon: [name] Kitchens, MD;  Location: [facility];  Service: Gastroenterology;  Laterality: N/A;  stent removal   ESOPHAGOGASTRODUODENOSCOPY N/A 10/02/2023   Procedure: EGD (ESOPHAGOGASTRODUODENOSCOPY);  Surgeon: Avram Lupita BRAVO, MD;  Location: [facility];  Service: Gastroenterology;  Laterality: N/A;   SPHINCTEROTOMY  10/04/2023   Procedure: SPHINCTEROTOMY, BILIARY;  Surgeon: [name] Kitchens, MD;  Location: [facility];  Service: Gastroenterology;;   Current Facility-Administered Medications  Medication Dose Route Frequency Provider Last Rate Last Admin   0.9 %  sodium chloride   infusion   Intravenous Continuous Al-Sultani, Anmar, MD 100 mL/hr at 05/19/24 0926 New Bag at 05/19/24 0926   acetaminophen  (TYLENOL ) tablet 650 mg  650 mg Oral Q6H PRN Al-Sultani, Anmar, MD       dorzolamide -timolol  (COSOPT ) 2-0.5 % ophthalmic solution 1 drop  1 drop Both Eyes BID Al-Sultani, Anmar, MD   1 drop at 05/19/24 0916   heparin  injection 5,000 Units  5,000 Units Subcutaneous Q8H Ned Hitch A, MD   5,000 Units at 05/19/24 1353   latanoprost  (XALATAN ) 0.005 % ophthalmic solution 1 drop  1 drop Both Eyes QHS Ned Hitch A, MD   1 drop at 05/18/24 2147   melatonin tablet 3 mg  3 mg Oral QHS Thomas, Sara-Maiz A, MD   3 mg at 05/18/24 2144   piperacillin-tazobactam (ZOSYN) IVPB 3.375 g  3.375 g Intravenous Q8H Al-Sultani, Anmar, MD 12.5 mL/hr at 05/19/24 1353 3.375 g at 05/19/24 1353   polyethylene glycol (MIRALAX  / GLYCOLAX ) packet 17 g  17 g Oral Daily PRN Al-Sultani, Anmar, MD       potassium chloride  SA (KLOR-CON  M) CR tablet 40 mEq  40 mEq Oral Q4H Al-Sultani, Anmar, MD   40 mEq at 05/19/24 1138   Allergies as of 05/16/2024 - Review Complete 05/16/2024  Allergen Reaction Noted   Oxycontin  [oxycodone ]  10/31/2023   Review of Systems:  Review of Systems  Constitutional:  Negative for chills and fever.  Respiratory:  Negative for shortness of breath.   Cardiovascular:  Negative for chest pain.  Gastrointestinal:  Negative for abdominal pain, nausea and vomiting.    OBJECTIVE:   Temp:  [97.7 F (36.5 C)-98.3 F (36.8 C)] 97.7 F (36.5  C) (11/08 0846) Pulse Rate:  [63-85] 63 (11/08 0846) Resp:  [18-20] 18 (11/08 0846) BP: (103-122)/(65-72) 114/72 (11/08 0846) SpO2:  [97 %-100 %] 100 % (11/08 0846) Last BM Date : 05/18/24 Physical Exam HENT:     Mouth/Throat:     Comments: Sublingual icterus  Cardiovascular:     Rate and Rhythm: Normal rate and regular rhythm.  Pulmonary:     Effort: No respiratory distress.     Breath sounds: Normal breath sounds.   Abdominal:     General: Bowel sounds are normal. There is no distension.     Palpations: Abdomen is soft.     Tenderness: There is no abdominal tenderness. There is no guarding.     Labs: Recent Labs    05/18/24 0020 05/18/24 0243 05/19/24 0459  WBC 17.8* 19.1* 17.0*  HGB 9.3* 9.0* 9.2*  HCT 26.6* 25.6* 25.7*  PLT 441* 424* 414*   BMET Recent Labs    05/18/24 0020 05/18/24 0243 05/19/24 0459  NA 139 141 145  K 3.3* 3.3* 3.1*  CL 105 106 110  CO2 23 22 23   GLUCOSE 124* 150* 95  BUN 47* 43* 34*  CREATININE 2.70* 2.61* 2.28*  CALCIUM 7.9* 7.9* 8.3*   LFT Recent Labs    05/18/24 0020 05/18/24 0243 05/19/24 0459  PROT 6.0*   < > 6.1*  ALBUMIN 1.6*   < > <1.5*  AST 64*   < > 57*  ALT 54*   < > 50*  ALKPHOS 299*   < > 255*  BILITOT 18.2*   < > 14.8*  BILIDIR 11.0*  --   --    < > = values in this interval not displayed.   PT/INR Recent Labs    05/18/24 0020  LABPROT 17.4*  INR 1.3*   Diagnostic imaging: MR ABDOMEN MRCP W WO CONTAST Result Date: 05/18/2024 CLINICAL DATA:  History of cholangiocarcinoma with jaundice EXAM: MRI ABDOMEN WITHOUT AND WITH CONTRAST (INCLUDING MRCP) TECHNIQUE: Multiplanar multisequence MR imaging of the abdomen was performed both before and after the administration of intravenous contrast. Heavily T2-weighted images of the biliary and pancreatic ducts were obtained. Post-processing was applied at the acquisition scanner with concurrent physician supervision which includes 3D reconstructions, MIPs, volume rendered images and/or shaded surface rendering. CONTRAST:  7mL GADAVIST  GADOBUTROL  1 MMOL/ML IV SOLN COMPARISON:  CT abdomen and pelvis dated 05/16/2024, MRI abdomen dated 04/12/2024 FINDINGS: Lower chest: Trace left pleural effusion. Hepatobiliary: Scattered simple-appearing cysts. Previously noted segment 5 arterially enhancing focus is not seen. Severe intra and extrahepatic bile duct dilation, increased compared to 04/12/2024 with the  common bile duct measuring up to 1.7 cm, previously 1.1 cm. Again, there is tapering at the level of ampulla, slightly more abrupt compared to 04/12/2024. Cholecystectomy. Pancreas: Similar to slightly increased diffuse main pancreatic ductal dilation measuring up to 5 mm throughout, previously 4 mm in the body/tail. No focal pancreatic lesion or inflammatory changes. Spleen:  Within normal limits in size and appearance. Adrenals/Urinary Tract: No adrenal nodules. No suspicious renal masses identified. No evidence of hydronephrosis. Unchanged simple exophytic upper pole renal cyst. Stomach/Bowel: Colonic diverticulosis without acute diverticulitis. Vascular/Lymphatic: No pathologically enlarged lymph nodes identified. No abdominal aortic aneurysm demonstrated. Other:  None. Musculoskeletal: No suspicious bone lesions identified. IMPRESSION: 1. Increased severe intra and extrahepatic bile duct dilation with tapering at the level of ampulla, slightly more abrupt compared to 04/12/2024, at the site of known cholangiocarcinoma. 2. Similar to slightly increased diffuse main pancreatic ductal dilation.  3. Trace left pleural effusion. Electronically Signed   By: Limin  Xu M.D.   On: 05/18/2024 15:03   MR 3D Recon At Scanner Result Date: 05/18/2024 CLINICAL DATA:  History of cholangiocarcinoma with jaundice EXAM: MRI ABDOMEN WITHOUT AND WITH CONTRAST (INCLUDING MRCP) TECHNIQUE: Multiplanar multisequence MR imaging of the abdomen was performed both before and after the administration of intravenous contrast. Heavily T2-weighted images of the biliary and pancreatic ducts were obtained. Post-processing was applied at the acquisition scanner with concurrent physician supervision which includes 3D reconstructions, MIPs, volume rendered images and/or shaded surface rendering. CONTRAST:  7mL GADAVIST  GADOBUTROL  1 MMOL/ML IV SOLN COMPARISON:  CT abdomen and pelvis dated 05/16/2024, MRI abdomen dated 04/12/2024 FINDINGS: Lower  chest: Trace left pleural effusion. Hepatobiliary: Scattered simple-appearing cysts. Previously noted segment 5 arterially enhancing focus is not seen. Severe intra and extrahepatic bile duct dilation, increased compared to 04/12/2024 with the common bile duct measuring up to 1.7 cm, previously 1.1 cm. Again, there is tapering at the level of ampulla, slightly more abrupt compared to 04/12/2024. Cholecystectomy. Pancreas: Similar to slightly increased diffuse main pancreatic ductal dilation measuring up to 5 mm throughout, previously 4 mm in the body/tail. No focal pancreatic lesion or inflammatory changes. Spleen:  Within normal limits in size and appearance. Adrenals/Urinary Tract: No adrenal nodules. No suspicious renal masses identified. No evidence of hydronephrosis. Unchanged simple exophytic upper pole renal cyst. Stomach/Bowel: Colonic diverticulosis without acute diverticulitis. Vascular/Lymphatic: No pathologically enlarged lymph nodes identified. No abdominal aortic aneurysm demonstrated. Other:  None. Musculoskeletal: No suspicious bone lesions identified. IMPRESSION: 1. Increased severe intra and extrahepatic bile duct dilation with tapering at the level of ampulla, slightly more abrupt compared to 04/12/2024, at the site of known cholangiocarcinoma. 2. Similar to slightly increased diffuse main pancreatic ductal dilation. 3. Trace left pleural effusion. Electronically Signed   By: Limin  Xu M.D.   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 Avram tomorrow    LOS: 3 days   Estefana Keas, DO [facility]

## 2024-05-19 NOTE — Consult Note (Signed)
 [facility]  Total days of antibiotics        Reason for Consult: pseudomonal bacteremia   Referring Physician: al-sultani  Principal Problem:   Syncope and collapse    HPI: Andrew Douglas is a 78 y.o. 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 He reports his appetite is slightly better but he has lost 20 lb in the past year. He was admitted on 11/5 for syncopal episode, while getting pedicure. He was found to have hypotension with sBp 80s. His labs were noteable for wbc of 15K, cr of 3.7 (Baseline of cr 1). AST 68 ALT 59, Tbili of 19.  Clinical picture was concerning for cholangitis, and was started on IVfluids and IV abtx, piptazo. His infectious work up revealed blood cx + pseudomonas by BCID. His urine cx showed efaecalis but patient denies any urinary symptoms. He is slated for ERCP and possible biliary stent placement tomorrow due to increased severe intra-extra hepatic bile duct dilation on mra abd. He is afebrile. Tolerating liquid diet.      Past Medical History:  Diagnosis Date   Hepatitis C    Hypertension     Allergies:  Allergies  Allergen Reactions   Oxycontin  [Oxycodone ]     Crazy dreams    Current antibiotics:   MEDICATIONS:  dorzolamide -timolol   1 drop Both Eyes BID   heparin   5,000 Units Subcutaneous Q8H   latanoprost   1 drop Both Eyes QHS   melatonin  3 mg Oral QHS    Social History   Tobacco Use   Smoking status: Former    Current packs/day: 1.00    Types: Cigarettes    Passive exposure: Past   Smokeless tobacco: Never   Tobacco comments:    Smoked 20years-1/2-1pk daily  Vaping Use   Vaping status: Never Used  Substance Use Topics   Alcohol use: Not Currently   Drug use:  Not Currently    History reviewed. No pertinent family history.  Review of Systems -  +weakness, fatigue, poor appetite. 12 point ros is otherwise negative  OBJECTIVE: Temp:  [97.7 F (36.5 C)-98.3 F (36.8 C)] 97.7 F (36.5 C) (11/08 0846) Pulse Rate:  [63-85] 63 (11/08 0846) Resp:  [18-20] 18 (11/08 0846) BP: (103-122)/(65-72) 114/72 (11/08 0846) SpO2:  [97 %-100 %] 100 % (11/08 0846) Physical Exam  Constitutional: He is oriented to person, place, and time. He appears well-developed and well-nourished. No distress.  HENT: jaundice and scleral icterus Mouth/Throat: Oropharynx is clear and moist. No oropharyngeal exudate.  Cardiovascular: Normal rate, regular rhythm and normal heart sounds. Exam reveals no gallop and no friction rub.  No murmur heard.  Pulmonary/Chest: Effort normal and breath sounds normal. No respiratory distress. He has no wheezes.  Abdominal: Soft. Bowel sounds are normal. He exhibits no distension. There is no tenderness.  Lymphadenopathy:  He has no cervical adenopathy.  Neurological: He is alert and oriented to person, place, and time.  Skin: Skin is warm and dry. No rash noted. No erythema.  Psychiatric: He has a normal mood and affect. His behavior is normal.   LABS: Results for orders placed or performed during the hospital encounter of 05/16/24 (from the past 48 hours)  Culture, blood (Routine X 2) w Reflex to ID  Panel     Status: None (Preliminary result)   Collection Time: 05/17/24  9:33 PM   Specimen: BLOOD  Result Value Ref Range   Specimen Description BLOOD SITE NOT SPECIFIED    Special Requests      BOTTLES DRAWN AEROBIC AND ANAEROBIC Blood Culture adequate volume   Culture  Setup Time      GRAM NEGATIVE RODS AEROBIC BOTTLE ONLY RBV [place] PHARMD 05/19/2024 @0647  BY DD Performed at [facility] Lab, [address] [place], [place] [address]    Culture GRAM NEGATIVE RODS    Report Status PENDING   Blood Culture ID Panel (Reflexed)      Status: Abnormal   Collection Time: 05/17/24  9:33 PM  Result Value Ref Range   Enterococcus faecalis NOT DETECTED NOT DETECTED   Enterococcus Faecium NOT DETECTED NOT DETECTED   Listeria monocytogenes NOT DETECTED NOT DETECTED   Staphylococcus species NOT DETECTED NOT DETECTED   Staphylococcus aureus (BCID) NOT DETECTED NOT DETECTED   Staphylococcus epidermidis NOT DETECTED NOT DETECTED   Staphylococcus lugdunensis NOT DETECTED NOT DETECTED   Streptococcus species NOT DETECTED NOT DETECTED   Streptococcus agalactiae NOT DETECTED NOT DETECTED   Streptococcus pneumoniae NOT DETECTED NOT DETECTED   Streptococcus pyogenes NOT DETECTED NOT DETECTED   A.calcoaceticus-baumannii NOT DETECTED NOT DETECTED   Bacteroides fragilis NOT DETECTED NOT DETECTED   Enterobacterales NOT DETECTED NOT DETECTED   Enterobacter cloacae complex NOT DETECTED NOT DETECTED   Escherichia coli NOT DETECTED NOT DETECTED   Klebsiella aerogenes NOT DETECTED NOT DETECTED   Klebsiella oxytoca NOT DETECTED NOT DETECTED   Klebsiella pneumoniae NOT DETECTED NOT DETECTED   Proteus species NOT DETECTED NOT DETECTED   Salmonella species NOT DETECTED NOT DETECTED   Serratia marcescens NOT DETECTED NOT DETECTED   Haemophilus influenzae NOT DETECTED NOT DETECTED   Neisseria meningitidis NOT DETECTED NOT DETECTED   Pseudomonas aeruginosa DETECTED (A) NOT DETECTED    Comment: CRITICAL RESULT CALLED TO, READ BACK BY AND VERIFIED WITH: RBV W[place] PHARMD 05/19/2024 @0647  BY DD    Stenotrophomonas maltophilia NOT DETECTED NOT DETECTED   Candida albicans NOT DETECTED NOT DETECTED   Candida auris NOT DETECTED NOT DETECTED   Candida glabrata NOT DETECTED NOT DETECTED   Candida krusei NOT DETECTED NOT DETECTED   Candida parapsilosis NOT DETECTED NOT DETECTED   Candida tropicalis NOT DETECTED NOT DETECTED   Cryptococcus neoformans/gattii NOT DETECTED NOT DETECTED   CTX-M ESBL NOT DETECTED NOT DETECTED   Carbapenem resistance IMP  NOT DETECTED NOT DETECTED   Carbapenem resistance KPC NOT DETECTED NOT DETECTED   Carbapenem resistance NDM NOT DETECTED NOT DETECTED   Carbapenem resistance VIM NOT DETECTED NOT DETECTED    Comment: Performed at [facility], [address] [place], [place] [address]  Culture, blood (Routine X 2) w Reflex to ID Panel     Status: None (Preliminary result)   Collection Time: 05/17/24  9:38 PM   Specimen: BLOOD  Result Value Ref Range   Specimen Description BLOOD SITE NOT SPECIFIED    Special Requests      BOTTLES DRAWN AEROBIC AND ANAEROBIC Blood Culture adequate volume   Culture  Setup Time      GRAM NEGATIVE RODS AEROBIC BOTTLE ONLY CRITICAL VALUE NOTED.  VALUE IS CONSISTENT WITH PREVIOUSLY REPORTED AND CALLED VALUE.    Culture      NO GROWTH 2 DAYS Performed at [facility], [address], [place], [place] [address]  Report Status PENDING   CBC     Status: Abnormal   Collection Time: 05/18/24 12:20 AM  Result Value Ref Range   WBC 17.8 (H) 4.0 - 10.5 K/uL   RBC 3.33 (L) 4.22 - 5.81 MIL/uL   Hemoglobin 9.3 (L) 13.0 - 17.0 g/dL   HCT 73.3 (L) 60.9 - 47.9 %   MCV 79.9 (L) 80.0 - 100.0 fL   MCH 27.9 26.0 - 34.0 pg   MCHC 35.0 30.0 - 36.0 g/dL   RDW 84.9 88.4 - 84.4 %   Platelets 441 (H) 150 - 400 K/uL   nRBC 0.0 0.0 - 0.2 %    Comment: Performed at [facility] Lab, [address] [place], [place] [address]  Comprehensive metabolic panel     Status: Abnormal   Collection Time: 05/18/24 12:20 AM  Result Value Ref Range   Sodium 139 135 - 145 mmol/L   Potassium 3.3 (L) 3.5 - 5.1 mmol/L   Chloride 105 98 - 111 mmol/L   CO2 23 22 - 32 mmol/L   Glucose, Bld 124 (H) 70 - 99 mg/dL    Comment: Glucose reference range applies only to samples taken after fasting for at least 8 hours.   BUN 47 (H) 8 - 23 mg/dL   Creatinine, Ser 7.29 (H) 0.61 - 1.24 mg/dL   Calcium 7.9 (L) 8.9 - 10.3 mg/dL   Total Protein 6.0 (L) 6.5 - 8.1 g/dL   Albumin 1.6 (L) 3.5 - 5.0 g/dL    AST 64 (H) 15 - 41 U/L   ALT 54 (H) 0 - 44 U/L   Alkaline Phosphatase 299 (H) 38 - 126 U/L   Total Bilirubin 18.2 (HH) 0.0 - 1.2 mg/dL    Comment: CRITICAL RESULT CALLED TO, READ BACK BY AND VERIFIED WITH RN, CHERYL MARSHALL 05/18/24 0208 MAULES   GFR, Estimated 23 (L) >60 mL/min    Comment: (NOTE) Calculated using the CKD-EPI Creatinine Equation (2021)    Anion gap 11 5 - 15    Comment: Performed at [facility] Lab, [address], [place], [place] [address]  Bilirubin, direct     Status: Abnormal   Collection Time: 05/18/24 12:20 AM  Result Value Ref Range   Bilirubin, Direct 11.0 (H) 0.0 - 0.2 mg/dL    Comment: RESULT CONFIRMED BY MANUAL DILUTION Performed at [facility] Lab, [address], [place], [place] [address]   Protime-INR     Status: Abnormal   Collection Time: 05/18/24 12:20 AM  Result Value Ref Range   Prothrombin Time 17.4 (H) 11.4 - 15.2 seconds   INR 1.3 (H) 0.8 - 1.2    Comment: (NOTE) INR goal varies based on device and disease states. Performed at [facility], [address] [place], [place] [address]   CBC     Status: Abnormal   Collection Time: 05/18/24  2:43 AM  Result Value Ref Range   WBC 19.1 (H) 4.0 - 10.5 K/uL   RBC 3.19 (L) 4.22 - 5.81 MIL/uL   Hemoglobin 9.0 (L) 13.0 - 17.0 g/dL   HCT 74.3 (L) 60.9 - 47.9 %   MCV 80.3 80.0 - 100.0 fL   MCH 28.2 26.0 - 34.0 pg   MCHC 35.2 30.0 - 36.0 g/dL   RDW 85.0 88.4 - 84.4 %   Platelets 424 (H) 150 - 400 K/uL   nRBC 0.0 0.0 - 0.2 %    Comment: Performed at [facility] Lab, [address] [place], [place] [address]  Comprehensive metabolic panel with GFR     Status: Abnormal   Collection Time: 05/18/24  2:43 AM  Result Value Ref Range   Sodium 141 135 - 145 mmol/L   Potassium 3.3 (L) 3.5 - 5.1 mmol/L   Chloride 106 98 - 111 mmol/L   CO2 22 22 - 32 mmol/L   Glucose, Bld 150 (H) 70 - 99 mg/dL    Comment: Glucose reference range applies only to samples taken after fasting for at least 8  hours.   BUN 43 (H) 8 - 23 mg/dL   Creatinine, Ser 7.38 (H) 0.61 - 1.24 mg/dL   Calcium 7.9 (L) 8.9 - 10.3 mg/dL   Total Protein 5.9 (L) 6.5 - 8.1 g/dL   Albumin <8.4 (L) 3.5 - 5.0 g/dL   AST 55 (H) 15 - 41 U/L   ALT 50 (H) 0 - 44 U/L   Alkaline Phosphatase 276 (H) 38 - 126 U/L   Total Bilirubin 17.1 (H) 0.0 - 1.2 mg/dL   GFR, Estimated 24 (L) >60 mL/min    Comment: (NOTE) Calculated using the CKD-EPI Creatinine Equation (2021)    Anion gap 13 5 - 15    Comment: Performed at [facility] Lab, [address] [place], [place] [address]  Folate     Status: None   Collection Time: 05/18/24  2:43 AM  Result Value Ref Range   Folate >20.0 >5.9 ng/mL    Comment: ICTERUS AT THIS LEVEL MAY AFFECT RESULT Performed at [facility] Lab, [address], [place], [place] [address]   Reticulocytes     Status: Abnormal   Collection Time: 05/18/24  2:43 AM  Result Value Ref Range   Retic Ct Pct 1.3 0.4 - 3.1 %   RBC. 3.24 (L) 4.22 - 5.81 MIL/uL   Retic Count, Absolute 41.5 19.0 - 186.0 K/uL   Immature Retic Fract 13.7 2.3 - 15.9 %    Comment: Performed at [facility] Lab, [address], [place], [place] [address]  Vitamin B12     Status: Abnormal   Collection Time: 05/18/24  4:06 PM  Result Value Ref Range   Vitamin B-12 1,321 (H) 180 - 914 pg/mL    Comment: ICTERUS AT THIS LEVEL MAY AFFECT RESULT (NOTE) This assay is not validated for testing neonatal or myeloproliferative syndrome specimens for Vitamin B12 levels. Performed at [facility] Lab, [address] [place], [place] [address]   Iron and TIBC     Status: Abnormal   Collection Time: 05/18/24  4:06 PM  Result Value Ref Range   Iron 34 (L) 45 - 182 ug/dL   TIBC 830 (L) 749 - 549 ug/dL   Saturation Ratios 20 17.9 - 39.5 %   UIBC 135 ug/dL    Comment: Performed at [facility] Lab, [address] [place], [place] [address]  Ferritin     Status: Abnormal   Collection Time: 05/18/24  4:06 PM  Result Value Ref  Range   Ferritin 1,035 (H) 24 - 336 ng/mL    Comment: ICTERUS AT THIS LEVEL MAY AFFECT RESULT Performed at [facility] Lab, [address] [place], [place] [address]   CBC     Status: Abnormal   Collection Time: 05/19/24  4:59 AM  Result Value Ref Range   WBC 17.0 (H) 4.0 - 10.5 K/uL   RBC 3.24 (L) 4.22 - 5.81 MIL/uL   Hemoglobin 9.2 (L)  13.0 - 17.0 g/dL   HCT 74.2 (L) 60.9 - 47.9 %   MCV 79.3 (L) 80.0 - 100.0 fL   MCH 28.4 26.0 - 34.0 pg   MCHC 35.8 30.0 - 36.0 g/dL   RDW 84.6 88.4 - 84.4 %   Platelets 414 (H) 150 - 400 K/uL   nRBC 0.0 0.0 - 0.2 %    Comment: Performed at [facility] Lab, [address], [place], [place] [address]  Comprehensive metabolic panel with GFR     Status: Abnormal   Collection Time: 05/19/24  4:59 AM  Result Value Ref Range   Sodium 145 135 - 145 mmol/L   Potassium 3.1 (L) 3.5 - 5.1 mmol/L   Chloride 110 98 - 111 mmol/L   CO2 23 22 - 32 mmol/L   Glucose, Bld 95 70 - 99 mg/dL    Comment: Glucose reference range applies only to samples taken after fasting for at least 8 hours.   BUN 34 (H) 8 - 23 mg/dL   Creatinine, Ser 7.71 (H) 0.61 - 1.24 mg/dL   Calcium 8.3 (L) 8.9 - 10.3 mg/dL   Total Protein 6.1 (L) 6.5 - 8.1 g/dL   Albumin <8.4 (L) 3.5 - 5.0 g/dL   AST 57 (H) 15 - 41 U/L   ALT 50 (H) 0 - 44 U/L   Alkaline Phosphatase 255 (H) 38 - 126 U/L   Total Bilirubin 14.8 (H) 0.0 - 1.2 mg/dL   GFR, Estimated 28 (L) >60 mL/min    Comment: (NOTE) Calculated using the CKD-EPI Creatinine Equation (2021)    Anion gap 12 5 - 15    Comment: Performed at [facility] Lab, [address], [place], [place] [address]  Prealbumin     Status: Abnormal   Collection Time: 05/19/24  4:59 AM  Result Value Ref Range   Prealbumin <5 (L) 18 - 38 mg/dL    Comment: Performed at [facility] Lab, [address] [place], [place] [address]    MICRO:  IMAGING: MR ABDOMEN MRCP W WO CONTAST Result Date: 05/18/2024 CLINICAL DATA:  History of cholangiocarcinoma  with jaundice EXAM: MRI ABDOMEN WITHOUT AND WITH CONTRAST (INCLUDING MRCP) TECHNIQUE: Multiplanar multisequence MR imaging of the abdomen was performed both before and after the administration of intravenous contrast. Heavily T2-weighted images of the biliary and pancreatic ducts were obtained. Post-processing was applied at the acquisition scanner with concurrent physician supervision which includes 3D reconstructions, MIPs, volume rendered images and/or shaded surface rendering. CONTRAST:  7mL GADAVIST  GADOBUTROL  1 MMOL/ML IV SOLN COMPARISON:  CT abdomen and pelvis dated 05/16/2024, MRI abdomen dated 04/12/2024 FINDINGS: Lower chest: Trace left pleural effusion. Hepatobiliary: Scattered simple-appearing cysts. Previously noted segment 5 arterially enhancing focus is not seen. Severe intra and extrahepatic bile duct dilation, increased compared to 04/12/2024 with the common bile duct measuring up to 1.7 cm, previously 1.1 cm. Again, there is tapering at the level of ampulla, slightly more abrupt compared to 04/12/2024. Cholecystectomy. Pancreas: Similar to slightly increased diffuse main pancreatic ductal dilation measuring up to 5 mm throughout, previously 4 mm in the body/tail. No focal pancreatic lesion or inflammatory changes. Spleen:  Within normal limits in size and appearance. Adrenals/Urinary Tract: No adrenal nodules. No suspicious renal masses identified. No evidence of hydronephrosis. Unchanged simple exophytic upper pole renal cyst. Stomach/Bowel: Colonic diverticulosis without acute diverticulitis. Vascular/Lymphatic: No pathologically enlarged lymph nodes identified. No abdominal aortic aneurysm demonstrated. Other:  None. Musculoskeletal: No suspicious bone lesions identified. IMPRESSION: 1.  Increased severe intra and extrahepatic bile duct dilation with tapering at the level of ampulla, slightly more abrupt compared to 04/12/2024, at the site of known cholangiocarcinoma. 2. Similar to slightly  increased diffuse main pancreatic ductal dilation. 3. Trace left pleural effusion. Electronically Signed   By: Limin  Xu M.D.   On: 05/18/2024 15:03   MR 3D Recon At Scanner Result Date: 05/18/2024 CLINICAL DATA:  History of cholangiocarcinoma with jaundice EXAM: MRI ABDOMEN WITHOUT AND WITH CONTRAST (INCLUDING MRCP) TECHNIQUE: Multiplanar multisequence MR imaging of the abdomen was performed both before and after the administration of intravenous contrast. Heavily T2-weighted images of the biliary and pancreatic ducts were obtained. Post-processing was applied at the acquisition scanner with concurrent physician supervision which includes 3D reconstructions, MIPs, volume rendered images and/or shaded surface rendering. CONTRAST:  7mL GADAVIST  GADOBUTROL  1 MMOL/ML IV SOLN COMPARISON:  CT abdomen and pelvis dated 05/16/2024, MRI abdomen dated 04/12/2024 FINDINGS: Lower chest: Trace left pleural effusion. Hepatobiliary: Scattered simple-appearing cysts. Previously noted segment 5 arterially enhancing focus is not seen. Severe intra and extrahepatic bile duct dilation, increased compared to 04/12/2024 with the common bile duct measuring up to 1.7 cm, previously 1.1 cm. Again, there is tapering at the level of ampulla, slightly more abrupt compared to 04/12/2024. Cholecystectomy. Pancreas: Similar to slightly increased diffuse main pancreatic ductal dilation measuring up to 5 mm throughout, previously 4 mm in the body/tail. No focal pancreatic lesion or inflammatory changes. Spleen:  Within normal limits in size and appearance. Adrenals/Urinary Tract: No adrenal nodules. No suspicious renal masses identified. No evidence of hydronephrosis. Unchanged simple exophytic upper pole renal cyst. Stomach/Bowel: Colonic diverticulosis without acute diverticulitis. Vascular/Lymphatic: No pathologically enlarged lymph nodes identified. No abdominal aortic aneurysm demonstrated. Other:  None. Musculoskeletal: No suspicious bone  lesions identified. IMPRESSION: 1. Increased severe intra and extrahepatic bile duct dilation with tapering at the level of ampulla, slightly more abrupt compared to 04/12/2024, at the site of known cholangiocarcinoma. 2. Similar to slightly increased diffuse main pancreatic ductal dilation. 3. Trace left pleural effusion. Electronically Signed   By: Limin  Xu M.D.   On: 05/18/2024 15:03    HISTORICAL MICRO/IMAGING  Assessment/Plan:  78yo M with history of cholangiocarcinoma, and biliary obstruction,admitted for cholangitis and secondary psuedomonal bacteremia  Cholangitis - continue on piptazo for the timebeing - will found blood cx results and sensitivities - leukocytosis = slightly improved. Anticipate to improve after ERCP - help relieve obstruction. Await findings for tomorrow's procedure  Efaecalis asymptomatic bactiuria =also is treated by piptazo.  Fever = appears resolved  Cholangiocarcinoma = has upcoming whipples would recommend to treat cholangitis/bacteremia prior to his surgery, may need to post pone by 5-7 days  Moderate protein-calorie malnutrition = defer to gi to when to restart regular diet.  evaluation of this patient requires complex antimicrobial therapy evaluation and counseling and isolation needs for disease transmission risk assessment and mitigation.   Montie FURY Luiz MD MPH [facility] [phone]

## 2024-05-19 NOTE — Anesthesia Preprocedure Evaluation (Signed)
 Anesthesia Evaluation  Patient identified by MRN, date of birth, ID band Patient awake    Reviewed: Allergy & Precautions, NPO status , Patient's Chart, lab work & pertinent test results  Airway Mallampati: II  TM Distance: >3 FB Neck ROM: Full    Dental  (+) Dental Advisory Given, Missing, Caps   Pulmonary former smoker   Pulmonary exam normal breath sounds clear to auscultation       Cardiovascular hypertension, Pt. on medications  Rhythm:Regular Rate:Normal  Echo 05/2024  1. Left ventricular ejection fraction, by estimation, is 60 to 65%. The  left ventricle has normal function. The left ventricle has no regional  wall motion abnormalities. There is mild concentric left ventricular  hypertrophy. Left ventricular diastolic parameters are consistent with  Grade I diastolic dysfunction (impaired relaxation).   2. Right ventricular systolic function is normal. The right ventricular  size is normal.   3. The mitral valve is normal in structure. Trivial mitral valve  regurgitation. No evidence of mitral stenosis.   4. The aortic valve is normal in structure. Aortic valve regurgitation is  not visualized. No aortic stenosis is present.   5. The inferior vena cava is normal in size with greater than 50%  respiratory variability, suggesting right atrial pressure of 3 mmHg.     Neuro/Psych negative neurological ROS  negative psych ROS   GI/Hepatic ,GERD  Medicated,,(+) Hepatitis -, CBile duct stent    Endo/Other  negative endocrine ROS    Renal/GU negative Renal ROS     Musculoskeletal negative musculoskeletal ROS (+)    Abdominal   Peds  Hematology negative hematology ROS (+)   Anesthesia Other Findings Day of surgery medications reviewed with the patient.  Reproductive/Obstetrics                              Anesthesia Physical Anesthesia Plan  ASA: 3  Anesthesia Plan: General    Post-op Pain Management: Minimal or no pain anticipated   Induction: Intravenous  PONV Risk Score and Plan: 2 and Dexamethasone  and Ondansetron   Airway Management Planned: Oral ETT  Additional Equipment:   Intra-op Plan:   Post-operative Plan: Extubation in OR  Informed Consent: I have reviewed the patients History and Physical, chart, labs and discussed the procedure including the risks, benefits and alternatives for the proposed anesthesia with the patient or authorized representative who has indicated his/her understanding and acceptance.     Dental advisory given  Plan Discussed with: CRNA  Anesthesia Plan Comments:          Anesthesia Quick Evaluation

## 2024-05-20 ENCOUNTER — Inpatient Hospital Stay (HOSPITAL_COMMUNITY): Payer: Self-pay | Admitting: Anesthesiology

## 2024-05-20 ENCOUNTER — Encounter (HOSPITAL_COMMUNITY): Admission: EM | Disposition: A | Discharge status: Home / Self Care | Payer: Self-pay | Source: Home / Self Care

## 2024-05-20 ENCOUNTER — Encounter (HOSPITAL_COMMUNITY): Payer: Self-pay | Admitting: Internal Medicine

## 2024-05-20 ENCOUNTER — Inpatient Hospital Stay (HOSPITAL_COMMUNITY)

## 2024-05-20 DIAGNOSIS — C221 Intrahepatic bile duct carcinoma: Secondary | ICD-10-CM

## 2024-05-20 DIAGNOSIS — K831 Obstruction of bile duct: Secondary | ICD-10-CM

## 2024-05-20 DIAGNOSIS — K8309 Other cholangitis: Secondary | ICD-10-CM

## 2024-05-20 DIAGNOSIS — K838 Other specified diseases of biliary tract: Secondary | ICD-10-CM

## 2024-05-20 DIAGNOSIS — K5710 Diverticulosis of small intestine without perforation or abscess without bleeding: Secondary | ICD-10-CM

## 2024-05-20 DIAGNOSIS — R55 Syncope and collapse: Secondary | ICD-10-CM | POA: Diagnosis not present

## 2024-05-20 HISTORY — PX: ERCP: SHX5425

## 2024-05-20 LAB — CBC
HCT: 26.4 % — ABNORMAL LOW (ref 39.0–52.0)
Hemoglobin: 9.2 g/dL — ABNORMAL LOW (ref 13.0–17.0)
MCH: 27.8 pg (ref 26.0–34.0)
MCHC: 34.8 g/dL (ref 30.0–36.0)
MCV: 79.8 fL — ABNORMAL LOW (ref 80.0–100.0)
Platelets: 446 K/uL — ABNORMAL HIGH (ref 150–400)
RBC: 3.31 MIL/uL — ABNORMAL LOW (ref 4.22–5.81)
RDW: 16 % — ABNORMAL HIGH (ref 11.5–15.5)
WBC: 14.1 K/uL — ABNORMAL HIGH (ref 4.0–10.5)
nRBC: 0 % (ref 0.0–0.2)

## 2024-05-20 LAB — COMPREHENSIVE METABOLIC PANEL WITH GFR
ALT: 45 U/L — ABNORMAL HIGH (ref 0–44)
AST: 60 U/L — ABNORMAL HIGH (ref 15–41)
Albumin: <1.5 g/dL — ABNORMAL LOW (ref 3.5–5.0)
Alkaline Phosphatase: 233 U/L — ABNORMAL HIGH (ref 38–126)
Anion gap: 13 (ref 5–15)
BUN: 30 mg/dL — ABNORMAL HIGH (ref 8–23)
CO2: 21 mmol/L — ABNORMAL LOW (ref 22–32)
Calcium: 8.3 mg/dL — ABNORMAL LOW (ref 8.9–10.3)
Chloride: 113 mmol/L — ABNORMAL HIGH (ref 98–111)
Creatinine, Ser: 1.95 mg/dL — ABNORMAL HIGH (ref 0.61–1.24)
GFR, Estimated: 34 mL/min — ABNORMAL LOW (ref >60)
Glucose, Bld: 100 mg/dL — ABNORMAL HIGH (ref 70–99)
Potassium: 3.9 mmol/L (ref 3.5–5.1)
Sodium: 147 mmol/L — ABNORMAL HIGH (ref 135–145)
Total Bilirubin: 13.5 mg/dL — ABNORMAL HIGH (ref 0.0–1.2)
Total Protein: 5.9 g/dL — ABNORMAL LOW (ref 6.5–8.1)

## 2024-05-20 MED ORDER — GLUCAGON HCL RDNA (DIAGNOSTIC) 1 MG IJ SOLR
INTRAMUSCULAR | Status: DC | PRN
  Administered 2024-05-20: .5 mg via INTRAVENOUS

## 2024-05-20 MED ORDER — SODIUM CHLORIDE 0.9 % IV SOLN
INTRAVENOUS | Status: DC | PRN
  Administered 2024-05-20: 30 mL

## 2024-05-20 MED ORDER — FENTANYL CITRATE (PF) 100 MCG/2ML IJ SOLN
INTRAMUSCULAR | Status: AC
  Filled 2024-05-20: qty 2

## 2024-05-20 MED ORDER — EPHEDRINE SULFATE-NACL 50-0.9 MG/10ML-% IV SOSY
PREFILLED_SYRINGE | INTRAVENOUS | Status: DC | PRN
  Administered 2024-05-20 (×2): 7.5 mg via INTRAVENOUS
  Administered 2024-05-20: 5 mg via INTRAVENOUS

## 2024-05-20 MED ORDER — LACTATED RINGERS IV SOLN: INTRAVENOUS | Status: DC | PRN

## 2024-05-20 MED ORDER — GLUCAGON HCL RDNA (DIAGNOSTIC) 1 MG IJ SOLR
INTRAMUSCULAR | Status: AC
  Filled 2024-05-20: qty 1

## 2024-05-20 MED ORDER — LIDOCAINE 2% (20 MG/ML) 5 ML SYRINGE
INTRAMUSCULAR | Status: DC | PRN
  Administered 2024-05-20: 60 mg via INTRAVENOUS

## 2024-05-20 MED ORDER — SODIUM CHLORIDE 0.9 % IV SOLN: INTRAVENOUS | Status: AC

## 2024-05-20 MED ORDER — ONDANSETRON HCL 4 MG/2ML IJ SOLN
INTRAMUSCULAR | Status: DC | PRN
Start: 2024-05-20 — End: 2024-05-20
  Administered 2024-05-20: 4 mg via INTRAVENOUS

## 2024-05-20 MED ORDER — SUGAMMADEX SODIUM 200 MG/2ML IV SOLN
INTRAVENOUS | Status: DC | PRN
  Administered 2024-05-20: 200 mg via INTRAVENOUS

## 2024-05-20 MED ORDER — FENTANYL CITRATE (PF) 250 MCG/5ML IJ SOLN
INTRAMUSCULAR | Status: DC | PRN
  Administered 2024-05-20 (×2): 50 ug via INTRAVENOUS

## 2024-05-20 MED ORDER — PROPOFOL 10 MG/ML IV BOLUS
INTRAVENOUS | Status: DC | PRN
  Administered 2024-05-20: 80 mg via INTRAVENOUS
  Administered 2024-05-20: 20 mg via INTRAVENOUS

## 2024-05-20 MED ORDER — PHENYLEPHRINE 80 MCG/ML (10ML) SYRINGE FOR IV PUSH (FOR BLOOD PRESSURE SUPPORT)
PREFILLED_SYRINGE | INTRAVENOUS | Status: DC | PRN
  Administered 2024-05-20 (×2): 160 ug via INTRAVENOUS

## 2024-05-20 MED ORDER — CIPROFLOXACIN IN D5W 400 MG/200ML IV SOLN
INTRAVENOUS | Status: AC
  Filled 2024-05-20: qty 200

## 2024-05-20 MED ORDER — ROCURONIUM BROMIDE 10 MG/ML (PF) SYRINGE
PREFILLED_SYRINGE | INTRAVENOUS | Status: DC | PRN
  Administered 2024-05-20: 40 mg via INTRAVENOUS

## 2024-05-20 MED ORDER — PHENYLEPHRINE HCL-NACL 20-0.9 MG/250ML-% IV SOLN
INTRAVENOUS | Status: DC | PRN
  Administered 2024-05-20: 50 ug/min via INTRAVENOUS

## 2024-05-20 MED ORDER — DICLOFENAC SUPPOSITORY 100 MG
RECTAL | Status: AC
  Filled 2024-05-20: qty 1

## 2024-05-20 MED ORDER — EPINEPHRINE 1 MG/10ML IV SOSY
PREFILLED_SYRINGE | INTRAVENOUS | Status: AC
  Filled 2024-05-20: qty 10

## 2024-05-20 MED ORDER — DICLOFENAC SUPPOSITORY 100 MG
RECTAL | Status: DC | PRN
  Administered 2024-05-20: 100 mg via RECTAL

## 2024-05-20 NOTE — OR PostOp (Incomplete)
 PACU TO INPATIENT HANDOFF REPORT  Name/Age/Gender Andrew Douglas 78 y.o. male  Code Status    Code Status Orders  (From admission, onward)           Start     Ordered   05/16/24 2101  Full code  Continuous       Question:  By:  Answer:  Default: patient does not have capacity for decision making, no surrogate or prior directive available   05/16/24 2105           Code Status History     Date Active Date Inactive Code Status Order ID Comments User Context   11/01/2023 0839 11/02/2023 1827 Full Code [id number]  Perri DELENA Meliton Mickey., MD ED   11/01/2023 0039 11/01/2023 0839 Full Code [id number]  Shona Terry SAILOR, DO ED   09/30/2023 1058 10/05/2023 1847 Full Code [id number]  Zella Katha HERO, MD ED       Home/SNF/Other {Discharge Destination:18313::Home}  Chief Complaint Syncope and collapse [R55] Dehydration [E86.0]  Level of Care/Admitting Diagnosis ED Disposition     ED Disposition  Admit   Condition  --   Comment  Hospital Area: [facility] [[id number]]  Level of Care: Telemetry [5]  May admit patient to [facility] or [facility] if equivalent level of care is available:: No  Diagnosis: Syncope and collapse [780.2.ICD-9-CM]  Admitting Physician: DEBBY CAMILA DELENA [[id number]]  Attending Physician: DEBBY CAMILA DELENA [[id number]]  Certification:: I certify this patient will need inpatient services for at least 2 midnights  Expected Medical Readiness: 05/18/2024          Medical History Past Medical History:  Diagnosis Date   Hepatitis C    Hypertension     Allergies Allergies  Allergen Reactions   Oxycontin  [Oxycodone ]     Crazy dreams    IV Location/Drains/Wounds Patient Lines/Drains/Airways Status     Active Line/Drains/Airways     Name Placement date Placement time Site Days   Peripheral IV 05/16/24 20 G 1.16 Left Antecubital 05/16/24  1626  Antecubital  4   GI Stent 11/01/23  1359  --  201   GI Stent 05/20/24  1040  --   less than 1            Labs/Imaging Results for orders placed or performed during the hospital encounter of 05/16/24 (from the past 48 hours)  Vitamin B12     Status: Abnormal   Collection Time: 05/18/24  4:06 PM  Result Value Ref Range   Vitamin B-12 1,321 (H) 180 - 914 pg/mL    Comment: ICTERUS AT THIS LEVEL MAY AFFECT RESULT (NOTE) This assay is not validated for testing neonatal or myeloproliferative syndrome specimens for Vitamin B12 levels. Performed at [facility], [address], [place], [place] [address]   Iron and TIBC     Status: Abnormal   Collection Time: 05/18/24  4:06 PM  Result Value Ref Range   Iron 34 (L) 45 - 182 ug/dL   TIBC 830 (L) 749 - 549 ug/dL   Saturation Ratios 20 17.9 - 39.5 %   UIBC 135 ug/dL    Comment: Performed at [facility], [address], [place], [place] [address]  Ferritin     Status: Abnormal   Collection Time: 05/18/24  4:06 PM  Result Value Ref Range   Ferritin 1,035 (H) 24 - 336 ng/mL    Comment: ICTERUS AT THIS LEVEL MAY AFFECT RESULT Performed at [facility]  Lab, [address], [place], [place] [address]   CBC     Status: Abnormal   Collection Time: 05/19/24  4:59 AM  Result Value Ref Range   WBC 17.0 (H) 4.0 - 10.5 K/uL   RBC 3.24 (L) 4.22 - 5.81 MIL/uL   Hemoglobin 9.2 (L) 13.0 - 17.0 g/dL   HCT 74.2 (L) 60.9 - 47.9 %   MCV 79.3 (L) 80.0 - 100.0 fL   MCH 28.4 26.0 - 34.0 pg   MCHC 35.8 30.0 - 36.0 g/dL   RDW 84.6 88.4 - 84.4 %   Platelets 414 (H) 150 - 400 K/uL   nRBC 0.0 0.0 - 0.2 %    Comment: Performed at [facility], [address], [place], [place] [address]  Comprehensive metabolic panel with GFR     Status: Abnormal   Collection Time: 05/19/24  4:59 AM  Result Value Ref Range   Sodium 145 135 - 145 mmol/L   Potassium 3.1 (L) 3.5 - 5.1 mmol/L   Chloride 110 98 - 111 mmol/L   CO2 23 22 - 32 mmol/L   Glucose, Bld 95 70 - 99 mg/dL    Comment: Glucose reference range applies only to  samples taken after fasting for at least 8 hours.   BUN 34 (H) 8 - 23 mg/dL   Creatinine, Ser 7.71 (H) 0.61 - 1.24 mg/dL   Calcium 8.3 (L) 8.9 - 10.3 mg/dL   Total Protein 6.1 (L) 6.5 - 8.1 g/dL   Albumin <8.4 (L) 3.5 - 5.0 g/dL   AST 57 (H) 15 - 41 U/L   ALT 50 (H) 0 - 44 U/L   Alkaline Phosphatase 255 (H) 38 - 126 U/L   Total Bilirubin 14.8 (H) 0.0 - 1.2 mg/dL   GFR, Estimated 28 (L) >60 mL/min    Comment: (NOTE) Calculated using the CKD-EPI Creatinine Equation (2021)    Anion gap 12 5 - 15    Comment: Performed at [facility], [address], [place], [place] [address]  Prealbumin     Status: Abnormal   Collection Time: 05/19/24  4:59 AM  Result Value Ref Range   Prealbumin <5 (L) 18 - 38 mg/dL    Comment: Performed at [facility], [address] [place], [place] [address]  Magnesium     Status: None   Collection Time: 05/19/24  4:59 AM  Result Value Ref Range   Magnesium 1.7 1.7 - 2.4 mg/dL    Comment: Performed at [facility], [address] [place], [place] [address]  CBC     Status: Abnormal   Collection Time: 05/20/24  3:27 AM  Result Value Ref Range   WBC 14.1 (H) 4.0 - 10.5 K/uL   RBC 3.31 (L) 4.22 - 5.81 MIL/uL   Hemoglobin 9.2 (L) 13.0 - 17.0 g/dL   HCT 73.5 (L) 60.9 - 47.9 %   MCV 79.8 (L) 80.0 - 100.0 fL   MCH 27.8 26.0 - 34.0 pg   MCHC 34.8 30.0 - 36.0 g/dL   RDW 83.9 (H) 88.4 - 84.4 %   Platelets 446 (H) 150 - 400 K/uL   nRBC 0.0 0.0 - 0.2 %    Comment: Performed at [facility], [address] [place], [place] [address]  Comprehensive metabolic panel with GFR     Status: Abnormal   Collection Time: 05/20/24  3:27 AM  Result Value Ref Range   Sodium 147 (H) 135 -  145 mmol/L   Potassium 3.9 3.5 - 5.1 mmol/L    Comment: DELTA CHECK NOTED   Chloride 113 (H) 98 - 111 mmol/L   CO2 21 (L) 22 - 32 mmol/L   Glucose, Bld 100 (H) 70 - 99 mg/dL    Comment: Glucose reference range applies only to samples taken after fasting for at  least 8 hours.   BUN 30 (H) 8 - 23 mg/dL   Creatinine, Ser 8.04 (H) 0.61 - 1.24 mg/dL   Calcium 8.3 (L) 8.9 - 10.3 mg/dL   Total Protein 5.9 (L) 6.5 - 8.1 g/dL   Albumin <8.4 (L) 3.5 - 5.0 g/dL   AST 60 (H) 15 - 41 U/L   ALT 45 (H) 0 - 44 U/L   Alkaline Phosphatase 233 (H) 38 - 126 U/L   Total Bilirubin 13.5 (H) 0.0 - 1.2 mg/dL   GFR, Estimated 34 (L) >60 mL/min    Comment: (NOTE) Calculated using the CKD-EPI Creatinine Equation (2021)    Anion gap 13 5 - 15    Comment: Performed at [facility], [address], [place], [place] [address]   No results found.  Pending Labs   Vitals/Pain Today's Vitals   05/20/24 9046 05/20/24 1120 05/20/24 1130 05/20/24 1144  BP: 122/79 96/67 104/69 108/77  Pulse: (!) 57 73 68 64  Resp: 16 20 15 15   Temp: 97.8 F (36.6 C) (!) 97.4 F (36.3 C)    TempSrc: Temporal     SpO2: 97% 95% 95% 96%  Weight:      Height:      PainSc: 0-No pain 0-No pain  0-No pain    Isolation Precautions @ISOLATION @  Administered Medications Periop Administered Meds from 05/20/2024 0950 to 05/20/2024 1145       Date/Time Order Dose Route Action Action by Comments    05/20/2024 0950 EST acetaminophen  (TYLENOL ) tablet 650 mg -- Oral MAR Hold Transfer Provider, Automatic --    05/20/2024 1030 EST diclofenac  suppository 100 mg Rectal Given Jock Knack, Collene BROCKS, RN --    06/03/2024 2200 EST dorzolamide -timolol  (COSOPT ) 2-0.5 % ophthalmic solution 1 drop -- Both Eyes Automatically Held Transfer Provider, Automatic --    06/03/2024 1000 EST dorzolamide -timolol  (COSOPT ) 2-0.5 % ophthalmic solution 1 drop -- Both Eyes Automatically Held Transfer Provider, Automatic --    06/02/2024 2200 EST dorzolamide -timolol  (COSOPT ) 2-0.5 % ophthalmic solution 1 drop -- Both Eyes Automatically Held Transfer Provider, Automatic --    06/02/2024 1000 EST dorzolamide -timolol  (COSOPT ) 2-0.5 % ophthalmic solution 1 drop -- Both Eyes Automatically Held Transfer Provider, Automatic  --    06/01/2024 2200 EST dorzolamide -timolol  (COSOPT ) 2-0.5 % ophthalmic solution 1 drop -- Both Eyes Automatically Held Transfer Provider, Automatic --    06/01/2024 1000 EST dorzolamide -timolol  (COSOPT ) 2-0.5 % ophthalmic solution 1 drop -- Both Eyes Automatically Held Transfer Provider, Automatic --    05/31/2024 2200 EST dorzolamide -timolol  (COSOPT ) 2-0.5 % ophthalmic solution 1 drop -- Both Eyes Automatically Held Transfer Provider, Automatic --    05/31/2024 1000 EST dorzolamide -timolol  (COSOPT ) 2-0.5 % ophthalmic solution 1 drop -- Both Eyes Automatically Held Transfer Provider, Automatic --    05/30/2024 2200 EST dorzolamide -timolol  (COSOPT ) 2-0.5 % ophthalmic solution 1 drop -- Both Eyes Automatically Held Transfer Provider, Automatic --    05/30/2024 1000 EST dorzolamide -timolol  (COSOPT ) 2-0.5 % ophthalmic solution 1 drop -- Both Eyes Automatically Held Transfer Provider, Automatic --    05/29/2024 2200 EST dorzolamide -timolol  (COSOPT ) 2-0.5 % ophthalmic solution 1 drop -- Both  Eyes Automatically Held Location Manager, Automatic --    05/29/2024 1000 EST dorzolamide -timolol  (COSOPT ) 2-0.5 % ophthalmic solution 1 drop -- Both Eyes Automatically Held Transfer Provider, Automatic --    05/28/2024 2200 EST dorzolamide -timolol  (COSOPT ) 2-0.5 % ophthalmic solution 1 drop -- Both Eyes Automatically Held Transfer Provider, Automatic --    05/28/2024 1000 EST dorzolamide -timolol  (COSOPT ) 2-0.5 % ophthalmic solution 1 drop -- Both Eyes Automatically Held Transfer Provider, Automatic --    05/27/2024 2200 EST dorzolamide -timolol  (COSOPT ) 2-0.5 % ophthalmic solution 1 drop -- Both Eyes Automatically Held Transfer Provider, Automatic --    05/27/2024 1000 EST dorzolamide -timolol  (COSOPT ) 2-0.5 % ophthalmic solution 1 drop -- Both Eyes Automatically Held Transfer Provider, Automatic --    05/26/2024 2200 EST dorzolamide -timolol  (COSOPT ) 2-0.5 % ophthalmic solution 1 drop -- Both Eyes Automatically Held  Transfer Provider, Automatic --    05/26/2024 1000 EST dorzolamide -timolol  (COSOPT ) 2-0.5 % ophthalmic solution 1 drop -- Both Eyes Automatically Held Transfer Provider, Automatic --    05/25/2024 2200 EST dorzolamide -timolol  (COSOPT ) 2-0.5 % ophthalmic solution 1 drop -- Both Eyes Automatically Held Transfer Provider, Automatic --    05/25/2024 1000 EST dorzolamide -timolol  (COSOPT ) 2-0.5 % ophthalmic solution 1 drop -- Both Eyes Automatically Held Transfer Provider, Automatic --    05/24/2024 2200 EST dorzolamide -timolol  (COSOPT ) 2-0.5 % ophthalmic solution 1 drop -- Both Eyes Automatically Held Transfer Provider, Automatic --    05/24/2024 1000 EST dorzolamide -timolol  (COSOPT ) 2-0.5 % ophthalmic solution 1 drop -- Both Eyes Automatically Held Transfer Provider, Automatic --    05/23/2024 2200 EST dorzolamide -timolol  (COSOPT ) 2-0.5 % ophthalmic solution 1 drop -- Both Eyes Automatically Held Transfer Provider, Automatic --    05/23/2024 1000 EST dorzolamide -timolol  (COSOPT ) 2-0.5 % ophthalmic solution 1 drop -- Both Eyes Automatically Held Transfer Provider, Automatic --    05/22/2024 2200 EST dorzolamide -timolol  (COSOPT ) 2-0.5 % ophthalmic solution 1 drop -- Both Eyes Automatically Held Transfer Provider, Automatic --    05/22/2024 1000 EST dorzolamide -timolol  (COSOPT ) 2-0.5 % ophthalmic solution 1 drop -- Both Eyes Automatically Held Transfer Provider, Automatic --    05/21/2024 2200 EST dorzolamide -timolol  (COSOPT ) 2-0.5 % ophthalmic solution 1 drop -- Both Eyes Automatically Held Transfer Provider, Automatic --    05/21/2024 1000 EST dorzolamide -timolol  (COSOPT ) 2-0.5 % ophthalmic solution 1 drop -- Both Eyes Automatically Held Transfer Provider, Automatic --    05/20/2024 2200 EST dorzolamide -timolol  (COSOPT ) 2-0.5 % ophthalmic solution 1 drop -- Both Eyes Automatically Held Transfer Provider, Automatic --    05/20/2024 0950 EST dorzolamide -timolol  (COSOPT ) 2-0.5 % ophthalmic solution 1 drop --  Both Eyes MAR Hold Transfer Provider, Automatic --    05/20/2024 1047 EST ePHEDrine  sulfate (PF) 5mg /mL syringe 5 mg Intravenous Given Virgil Ee, CRNA --    05/20/2024 1031 EST ePHEDrine  sulfate (PF) 5mg /mL syringe 7.5 mg Intravenous Given Virgil Ee, CRNA --    05/20/2024 1021 EST ePHEDrine  sulfate (PF) 5mg /mL syringe 7.5 mg Intravenous Given Virgil Ee, CRNA --    05/28/2024 1400 EST feeding supplement (ENSURE PLUS HIGH PROTEIN) liquid 237 mL -- Oral Automatically Held Transfer Provider, Automatic --    05/28/2024 1000 EST feeding supplement (ENSURE PLUS HIGH PROTEIN) liquid 237 mL -- Oral Automatically Held Transfer Provider, Automatic --    05/27/2024 1400 EST feeding supplement (ENSURE PLUS HIGH PROTEIN) liquid 237 mL -- Oral Automatically Held Transfer Provider, Automatic --    05/27/2024 1000 EST feeding supplement (ENSURE PLUS HIGH PROTEIN) liquid 237 mL -- Oral Automatically Held Transfer Provider, Automatic --    05/26/2024 1400  EST feeding supplement (ENSURE PLUS HIGH PROTEIN) liquid 237 mL -- Oral Automatically Held Transfer Provider, Automatic --    05/26/2024 1000 EST feeding supplement (ENSURE PLUS HIGH PROTEIN) liquid 237 mL -- Oral Automatically Held Transfer Provider, Automatic --    05/25/2024 1400 EST feeding supplement (ENSURE PLUS HIGH PROTEIN) liquid 237 mL -- Oral Automatically Held Transfer Provider, Automatic --    05/25/2024 1000 EST feeding supplement (ENSURE PLUS HIGH PROTEIN) liquid 237 mL -- Oral Automatically Held Transfer Provider, Automatic --    05/24/2024 1400 EST feeding supplement (ENSURE PLUS HIGH PROTEIN) liquid 237 mL -- Oral Automatically Held Transfer Provider, Automatic --    05/24/2024 1000 EST feeding supplement (ENSURE PLUS HIGH PROTEIN) liquid 237 mL -- Oral Automatically Held Transfer Provider, Automatic --    05/23/2024 1400 EST feeding supplement (ENSURE PLUS HIGH PROTEIN) liquid 237 mL -- Oral Automatically Held Transfer Provider, Automatic --     05/23/2024 1000 EST feeding supplement (ENSURE PLUS HIGH PROTEIN) liquid 237 mL -- Oral Automatically Held Transfer Provider, Automatic --    05/22/2024 1400 EST feeding supplement (ENSURE PLUS HIGH PROTEIN) liquid 237 mL -- Oral Automatically Held Transfer Provider, Automatic --    05/22/2024 1000 EST feeding supplement (ENSURE PLUS HIGH PROTEIN) liquid 237 mL -- Oral Automatically Held Transfer Provider, Automatic --    05/21/2024 1400 EST feeding supplement (ENSURE PLUS HIGH PROTEIN) liquid 237 mL -- Oral Automatically Held Transfer Provider, Automatic --    05/21/2024 1000 EST feeding supplement (ENSURE PLUS HIGH PROTEIN) liquid 237 mL -- Oral Automatically Held Transfer Provider, Automatic --    05/20/2024 1400 EST feeding supplement (ENSURE PLUS HIGH PROTEIN) liquid 237 mL -- Oral Automatically Held Transfer Provider, Automatic --    05/20/2024 0950 EST feeding supplement (ENSURE PLUS HIGH PROTEIN) liquid 237 mL -- Oral MAR Hold Transfer Provider, Automatic --    05/20/2024 1103 EST fentaNYL  citrate (PF) (SUBLIMAZE ) injection 50 mcg Intravenous Given Virgil Ee, CRNA --    05/20/2024 1042 EST fentaNYL  citrate (PF) (SUBLIMAZE ) injection 50 mcg Intravenous Given Virgil Ee, CRNA --    05/20/2024 1047 EST glucagon  (human recombinant) (GLUCAGEN ) injection 0.5 mg Intravenous Given Virgil Ee, CRNA --    06/01/2024 2200 EST heparin  injection 5,000 Units -- Subcutaneous Automatically Held Transfer Provider, Automatic --    06/01/2024 1400 EST heparin  injection 5,000 Units -- Subcutaneous Automatically Held Transfer Provider, Automatic --    06/01/2024 0600 EST heparin  injection 5,000 Units -- Subcutaneous Automatically Held Transfer Provider, Automatic --    05/31/2024 2200 EST heparin  injection 5,000 Units -- Subcutaneous Automatically Held Transfer Provider, Automatic --    05/31/2024 1400 EST heparin  injection 5,000 Units -- Subcutaneous Automatically Held Transfer Provider, Automatic --     05/31/2024 0600 EST heparin  injection 5,000 Units -- Subcutaneous Automatically Held Transfer Provider, Automatic --    05/30/2024 2200 EST heparin  injection 5,000 Units -- Subcutaneous Automatically Held Transfer Provider, Automatic --    05/30/2024 1400 EST heparin  injection 5,000 Units -- Subcutaneous Automatically Held Transfer Provider, Automatic --    05/30/2024 0600 EST heparin  injection 5,000 Units -- Subcutaneous Automatically Held Transfer Provider, Automatic --    05/29/2024 2200 EST heparin  injection 5,000 Units -- Subcutaneous Automatically Held Transfer Provider, Automatic --    05/29/2024 1400 EST heparin  injection 5,000 Units -- Subcutaneous Automatically Held Transfer Provider, Automatic --    05/29/2024 0600 EST heparin  injection 5,000 Units -- Subcutaneous Automatically Held Transfer Provider, Automatic --    05/28/2024 2200 EST heparin  injection  5,000 Units -- Subcutaneous Automatically Held Location Manager, Automatic --    05/28/2024 1400 EST heparin  injection 5,000 Units -- Subcutaneous Automatically Held Transfer Provider, Automatic --    05/28/2024 0600 EST heparin  injection 5,000 Units -- Subcutaneous Automatically Held Transfer Provider, Automatic --    05/27/2024 2200 EST heparin  injection 5,000 Units -- Subcutaneous Automatically Held Transfer Provider, Automatic --    05/27/2024 1400 EST heparin  injection 5,000 Units -- Subcutaneous Automatically Held Transfer Provider, Automatic --    05/27/2024 0600 EST heparin  injection 5,000 Units -- Subcutaneous Automatically Held Transfer Provider, Automatic --    05/26/2024 2200 EST heparin  injection 5,000 Units -- Subcutaneous Automatically Held Transfer Provider, Automatic --    05/26/2024 1400 EST heparin  injection 5,000 Units -- Subcutaneous Automatically Held Transfer Provider, Automatic --    05/26/2024 0600 EST heparin  injection 5,000 Units -- Subcutaneous Automatically Held Transfer Provider, Automatic --    05/25/2024  2200 EST heparin  injection 5,000 Units -- Subcutaneous Automatically Held Transfer Provider, Automatic --    05/25/2024 1400 EST heparin  injection 5,000 Units -- Subcutaneous Automatically Held Transfer Provider, Automatic --    05/25/2024 0600 EST heparin  injection 5,000 Units -- Subcutaneous Automatically Held Transfer Provider, Automatic --    05/24/2024 2200 EST heparin  injection 5,000 Units -- Subcutaneous Automatically Held Transfer Provider, Automatic --    05/24/2024 1400 EST heparin  injection 5,000 Units -- Subcutaneous Automatically Held Transfer Provider, Automatic --    05/24/2024 0600 EST heparin  injection 5,000 Units -- Subcutaneous Automatically Held Transfer Provider, Automatic --    05/23/2024 2200 EST heparin  injection 5,000 Units -- Subcutaneous Automatically Held Transfer Provider, Automatic --    05/23/2024 1400 EST heparin  injection 5,000 Units -- Subcutaneous Automatically Held Transfer Provider, Automatic --    05/23/2024 0600 EST heparin  injection 5,000 Units -- Subcutaneous Automatically Held Transfer Provider, Automatic --    05/22/2024 2200 EST heparin  injection 5,000 Units -- Subcutaneous Automatically Held Transfer Provider, Automatic --    05/22/2024 1400 EST heparin  injection 5,000 Units -- Subcutaneous Automatically Held Transfer Provider, Automatic --    05/22/2024 0600 EST heparin  injection 5,000 Units -- Subcutaneous Automatically Held Transfer Provider, Automatic --    05/21/2024 2200 EST heparin  injection 5,000 Units -- Subcutaneous Automatically Held Transfer Provider, Automatic --    05/21/2024 1400 EST heparin  injection 5,000 Units -- Subcutaneous Automatically Held Transfer Provider, Automatic --    05/21/2024 0600 EST heparin  injection 5,000 Units -- Subcutaneous Automatically Held Transfer Provider, Automatic --    05/20/2024 2200 EST heparin  injection 5,000 Units -- Subcutaneous Automatically Held Transfer Provider, Automatic --    05/20/2024 1400 EST  heparin  injection 5,000 Units -- Subcutaneous Automatically Held Transfer Provider, Automatic --    05/20/2024 0950 EST heparin  injection 5,000 Units -- Subcutaneous MAR Hold Transfer Provider, Automatic --    05/20/2024 1123 EST lactated ringers  infusion -- Intravenous Anesthesia Volume Adjustment Virgil Ee, CRNA --    05/20/2024 1007 EST lactated ringers  infusion -- Intravenous New Bag/Given Virgil Ee, CRNA --    06/01/2024 2200 EST latanoprost  (XALATAN ) 0.005 % ophthalmic solution 1 drop -- Both Eyes Automatically Held Transfer Provider, Automatic --    05/31/2024 2200 EST latanoprost  (XALATAN ) 0.005 % ophthalmic solution 1 drop -- Both Eyes Automatically Held Transfer Provider, Automatic --    05/30/2024 2200 EST latanoprost  (XALATAN ) 0.005 % ophthalmic solution 1 drop -- Both Eyes Automatically Held Transfer Provider, Automatic --    05/29/2024 2200 EST latanoprost  (XALATAN ) 0.005 % ophthalmic solution 1 drop -- Both  Eyes Automatically Held Location Manager, Automatic --    05/28/2024 2200 EST latanoprost  (XALATAN ) 0.005 % ophthalmic solution 1 drop -- Both Eyes Automatically Held Transfer Provider, Automatic --    05/27/2024 2200 EST latanoprost  (XALATAN ) 0.005 % ophthalmic solution 1 drop -- Both Eyes Automatically Held Transfer Provider, Automatic --    05/26/2024 2200 EST latanoprost  (XALATAN ) 0.005 % ophthalmic solution 1 drop -- Both Eyes Automatically Held Transfer Provider, Automatic --    05/25/2024 2200 EST latanoprost  (XALATAN ) 0.005 % ophthalmic solution 1 drop -- Both Eyes Automatically Held Transfer Provider, Automatic --    05/24/2024 2200 EST latanoprost  (XALATAN ) 0.005 % ophthalmic solution 1 drop -- Both Eyes Automatically Held Transfer Provider, Automatic --    05/23/2024 2200 EST latanoprost  (XALATAN ) 0.005 % ophthalmic solution 1 drop -- Both Eyes Automatically Held Transfer Provider, Automatic --    05/22/2024 2200 EST latanoprost  (XALATAN ) 0.005 % ophthalmic solution  1 drop -- Both Eyes Automatically Held Transfer Provider, Automatic --    05/21/2024 2200 EST latanoprost  (XALATAN ) 0.005 % ophthalmic solution 1 drop -- Both Eyes Automatically Held Transfer Provider, Automatic --    05/20/2024 2200 EST latanoprost  (XALATAN ) 0.005 % ophthalmic solution 1 drop -- Both Eyes Automatically Held Transfer Provider, Automatic --    05/20/2024 0950 EST latanoprost  (XALATAN ) 0.005 % ophthalmic solution 1 drop -- Both Eyes MAR Hold Transfer Provider, Automatic --    05/20/2024 1012 EST lidocaine  2% (20 mg/mL) 5 mL syringe 60 mg Intravenous Given Darlyn Rush, MD --    06/01/2024 2200 EST melatonin tablet 3 mg -- Oral Automatically Held Transfer Provider, Automatic --    05/31/2024 2200 EST melatonin tablet 3 mg -- Oral Automatically Held Transfer Provider, Automatic --    05/30/2024 2200 EST melatonin tablet 3 mg -- Oral Automatically Held Transfer Provider, Automatic --    05/29/2024 2200 EST melatonin tablet 3 mg -- Oral Automatically Held Transfer Provider, Automatic --    05/28/2024 2200 EST melatonin tablet 3 mg -- Oral Automatically Held Transfer Provider, Automatic --    05/27/2024 2200 EST melatonin tablet 3 mg -- Oral Automatically Held Transfer Provider, Automatic --    05/26/2024 2200 EST melatonin tablet 3 mg -- Oral Automatically Held Transfer Provider, Automatic --    05/25/2024 2200 EST melatonin tablet 3 mg -- Oral Automatically Held Transfer Provider, Automatic --    05/24/2024 2200 EST melatonin tablet 3 mg -- Oral Automatically Held Transfer Provider, Automatic --    05/23/2024 2200 EST melatonin tablet 3 mg -- Oral Automatically Held Transfer Provider, Automatic --    05/22/2024 2200 EST melatonin tablet 3 mg -- Oral Automatically Held Transfer Provider, Automatic --    05/21/2024 2200 EST melatonin tablet 3 mg -- Oral Automatically Held Transfer Provider, Automatic --    05/20/2024 2200 EST melatonin tablet 3 mg -- Oral Automatically Held Transfer  Provider, Automatic --    05/20/2024 0950 EST melatonin tablet 3 mg -- Oral MAR Hold Transfer Provider, Automatic --    05/20/2024 1030 EST Omnipaque  350 mg/mL (50 mL) in 0.9% normal saline (50 mL) 30 mL Other Given [employer], Collene BROCKS, RN --    05/20/2024 1034 EST ondansetron  (ZOFRAN ) injection 4 mg Intravenous Given Virgil Ee, CRNA --    05/20/2024 1106 EST phenylephrine  (NEO-SYNEPHRINE) 20mg /NS premix infusion 0 mcg/min Intravenous Stopped Virgil Ee, CRNA --    05/20/2024 1051 EST phenylephrine  (NEO-SYNEPHRINE) 20mg /NS premix infusion 60 mcg/min Intravenous Rate/Dose Change Virgil Ee, CRNA --  05/20/2024 1046 EST phenylephrine  (NEO-SYNEPHRINE) 20mg /NS premix infusion 50 mcg/min Intravenous Restarted Virgil Ee, CRNA --    05/20/2024 1041 EST phenylephrine  (NEO-SYNEPHRINE) 20mg /NS premix infusion 0 mcg/min Intravenous Stopped [place], Rose, CRNA --    05/20/2024 1035 EST phenylephrine  (NEO-SYNEPHRINE) 20mg /NS premix infusion 30 mcg/min Intravenous Rate/Dose Change Virgil Ee, CRNA --    05/20/2024 1029 EST phenylephrine  (NEO-SYNEPHRINE) 20mg /NS premix infusion 50 mcg/min Intravenous New Bag/Given Virgil Ee, CRNA --    05/20/2024 1051 EST PHENYLephrine  80 mcg/ml in normal saline Adult IV Push Syringe (For Blood Pressure Support) 160 mcg Intravenous Given Virgil Ee, CRNA --    05/20/2024 1022 EST PHENYLephrine  80 mcg/ml in normal saline Adult IV Push Syringe (For Blood Pressure Support) 160 mcg Intravenous Given Virgil Ee, CRNA --    06/03/2024 2200 EST piperacillin-tazobactam (ZOSYN) IVPB 3.375 g -- Intravenous Automatically Held Transfer Provider, Automatic --    06/03/2024 1400 EST piperacillin-tazobactam (ZOSYN) IVPB 3.375 g -- Intravenous Automatically Held Transfer Provider, Automatic --    06/03/2024 0600 EST piperacillin-tazobactam (ZOSYN) IVPB 3.375 g -- Intravenous Automatically Held Location Manager, Automatic --    06/02/2024  2200 EST piperacillin-tazobactam (ZOSYN) IVPB 3.375 g -- Intravenous Automatically Held Location Manager, Automatic --    06/02/2024 1400 EST piperacillin-tazobactam (ZOSYN) IVPB 3.375 g -- Intravenous Automatically Held Location Manager, Automatic --    06/02/2024 0600 EST piperacillin-tazobactam (ZOSYN) IVPB 3.375 g -- Intravenous Automatically Held Transfer Provider, Automatic --    06/01/2024 2200 EST piperacillin-tazobactam (ZOSYN) IVPB 3.375 g -- Intravenous Automatically Held Location Manager, Automatic --    06/01/2024 1400 EST piperacillin-tazobactam (ZOSYN) IVPB 3.375 g -- Intravenous Automatically Held Location Manager, Automatic --    06/01/2024 0600 EST piperacillin-tazobactam (ZOSYN) IVPB 3.375 g -- Intravenous Automatically Held Transfer Provider, Automatic --    05/31/2024 2200 EST piperacillin-tazobactam (ZOSYN) IVPB 3.375 g -- Intravenous Automatically Held Location Manager, Automatic --    05/31/2024 1400 EST piperacillin-tazobactam (ZOSYN) IVPB 3.375 g -- Intravenous Automatically Held Location Manager, Automatic --    05/31/2024 0600 EST piperacillin-tazobactam (ZOSYN) IVPB 3.375 g -- Intravenous Automatically Held Transfer Provider, Automatic --    05/30/2024 2200 EST piperacillin-tazobactam (ZOSYN) IVPB 3.375 g -- Intravenous Automatically Held Location Manager, Automatic --    05/30/2024 1400 EST piperacillin-tazobactam (ZOSYN) IVPB 3.375 g -- Intravenous Automatically Held Location Manager, Automatic --    05/30/2024 0600 EST piperacillin-tazobactam (ZOSYN) IVPB 3.375 g -- Intravenous Automatically Held Transfer Provider, Automatic --    05/29/2024 2200 EST piperacillin-tazobactam (ZOSYN) IVPB 3.375 g -- Intravenous Automatically Held Location Manager, Automatic --    05/29/2024 1400 EST piperacillin-tazobactam (ZOSYN) IVPB 3.375 g -- Intravenous Automatically Held Transfer Provider, Automatic --    05/29/2024 0600 EST piperacillin-tazobactam (ZOSYN) IVPB 3.375 g --  Intravenous Automatically Held Transfer Provider, Automatic --    05/28/2024 2200 EST piperacillin-tazobactam (ZOSYN) IVPB 3.375 g -- Intravenous Automatically Held Location Manager, Automatic --    05/28/2024 1400 EST piperacillin-tazobactam (ZOSYN) IVPB 3.375 g -- Intravenous Automatically Held Transfer Provider, Automatic --    05/28/2024 0600 EST piperacillin-tazobactam (ZOSYN) IVPB 3.375 g -- Intravenous Automatically Held Transfer Provider, Automatic --    05/27/2024 2200 EST piperacillin-tazobactam (ZOSYN) IVPB 3.375 g -- Intravenous Automatically Held Transfer Provider, Automatic --    05/27/2024 1400 EST piperacillin-tazobactam (ZOSYN) IVPB 3.375 g -- Intravenous Automatically Held Transfer Provider, Automatic --    05/27/2024 0600 EST piperacillin-tazobactam (ZOSYN) IVPB 3.375 g -- Intravenous Automatically Held Transfer Provider, Automatic --    05/26/2024 2200 EST piperacillin-tazobactam (  ZOSYN) IVPB 3.375 g -- Intravenous Automatically Held Location Manager, Automatic --    05/26/2024 1400 EST piperacillin-tazobactam (ZOSYN) IVPB 3.375 g -- Intravenous Automatically Held Location Manager, Automatic --    05/26/2024 0600 EST piperacillin-tazobactam (ZOSYN) IVPB 3.375 g -- Intravenous Automatically Held Location Manager, Automatic --    05/25/2024 2200 EST piperacillin-tazobactam (ZOSYN) IVPB 3.375 g -- Intravenous Automatically Held Location Manager, Automatic --    05/25/2024 1400 EST piperacillin-tazobactam (ZOSYN) IVPB 3.375 g -- Intravenous Automatically Held Location Manager, Automatic --    05/25/2024 0600 EST piperacillin-tazobactam (ZOSYN) IVPB 3.375 g -- Intravenous Automatically Held Location Manager, Automatic --    05/24/2024 2200 EST piperacillin-tazobactam (ZOSYN) IVPB 3.375 g -- Intravenous Automatically Held Location Manager, Automatic --    05/24/2024 1400 EST piperacillin-tazobactam (ZOSYN) IVPB 3.375 g -- Intravenous Automatically Held Location Manager, Automatic  --    05/24/2024 0600 EST piperacillin-tazobactam (ZOSYN) IVPB 3.375 g -- Intravenous Automatically Held Location Manager, Automatic --    05/23/2024 2200 EST piperacillin-tazobactam (ZOSYN) IVPB 3.375 g -- Intravenous Automatically Held Location Manager, Automatic --    05/23/2024 1400 EST piperacillin-tazobactam (ZOSYN) IVPB 3.375 g -- Intravenous Automatically Held Location Manager, Automatic --    05/23/2024 0600 EST piperacillin-tazobactam (ZOSYN) IVPB 3.375 g -- Intravenous Automatically Held Location Manager, Automatic --    05/22/2024 2200 EST piperacillin-tazobactam (ZOSYN) IVPB 3.375 g -- Intravenous Automatically Held Location Manager, Automatic --    05/22/2024 1400 EST piperacillin-tazobactam (ZOSYN) IVPB 3.375 g -- Intravenous Automatically Held Location Manager, Automatic --    05/22/2024 0600 EST piperacillin-tazobactam (ZOSYN) IVPB 3.375 g -- Intravenous Automatically Held Location Manager, Automatic --    05/21/2024 2200 EST piperacillin-tazobactam (ZOSYN) IVPB 3.375 g -- Intravenous Automatically Held Location Manager, Automatic --    05/21/2024 1400 EST piperacillin-tazobactam (ZOSYN) IVPB 3.375 g -- Intravenous Automatically Held Location Manager, Automatic --    05/21/2024 0600 EST piperacillin-tazobactam (ZOSYN) IVPB 3.375 g -- Intravenous Automatically Held Transfer Provider, Automatic --    05/20/2024 2200 EST piperacillin-tazobactam (ZOSYN) IVPB 3.375 g -- Intravenous Automatically Held Transfer Provider, Automatic --    05/20/2024 1400 EST piperacillin-tazobactam (ZOSYN) IVPB 3.375 g -- Intravenous Automatically Held Transfer Provider, Automatic --    05/20/2024 0950 EST piperacillin-tazobactam (ZOSYN) IVPB 3.375 g -- Intravenous MAR Hold Transfer Provider, Automatic --    05/20/2024 0950 EST polyethylene glycol (MIRALAX  / GLYCOLAX ) packet 17 g -- Oral MAR Hold Transfer Provider, Automatic --    05/20/2024 1016 EST propofol  (DIPRIVAN ) 10 mg/mL bolus/IV push 20 mg  Intravenous Given Darlyn Rush, MD --    05/20/2024 1013 EST propofol  (DIPRIVAN ) 10 mg/mL bolus/IV push 80 mg Intravenous Given Darlyn Rush, MD --    05/20/2024 1014 EST rocuronium  (ZEMURON ) injection 40 mg Intravenous Given Darlyn Rush, MD --    05/20/2024 1110 EST sugammadex  sodium (BRIDION ) injection 200 mg Intravenous Given Virgil Ee, CRNA --       Mobility {Mobility:[id number]}

## 2024-05-20 NOTE — Plan of Care (Signed)
  Problem: Health Behavior/Discharge Planning: Goal: Ability to manage health-related needs will improve Outcome: Progressing   Problem: Clinical Measurements: Goal: Ability to maintain clinical measurements within normal limits will improve Outcome: Progressing Goal: Will remain free from infection Outcome: Progressing Goal: Diagnostic test results will improve Outcome: Progressing Goal: Respiratory complications will improve Outcome: Progressing Goal: Cardiovascular complication will be avoided Outcome: Progressing   Problem: Activity: Goal: Risk for activity intolerance will decrease Outcome: Progressing   Problem: Nutrition: Goal: Adequate nutrition will be maintained Outcome: Progressing   Problem: Coping: Goal: Level of anxiety will decrease Outcome: Progressing   Problem: Elimination: Goal: Will not experience complications related to bowel motility Outcome: Progressing Goal: Will not experience complications related to urinary retention Outcome: Progressing   Problem: Pain Managment: Goal: General experience of comfort will improve and/or be controlled Outcome: Progressing   Problem: Safety: Goal: Ability to remain free from injury will improve Outcome: Progressing   Problem: Skin Integrity: Goal: Risk for impaired skin integrity will decrease Outcome: Progressing   Problem: Education: Goal: Knowledge of General Education information will improve Description: Including pain rating scale, medication(s)/side effects and non-pharmacologic comfort measures Outcome: Progressing   Problem: Health Behavior/Discharge Planning: Goal: Ability to manage health-related needs will improve Outcome: Progressing   Problem: Clinical Measurements: Goal: Ability to maintain clinical measurements within normal limits will improve Outcome: Progressing Goal: Will remain free from infection Outcome: Progressing Goal: Diagnostic test results will improve Outcome:  Progressing Goal: Respiratory complications will improve Outcome: Progressing Goal: Cardiovascular complication will be avoided Outcome: Progressing   Problem: Activity: Goal: Risk for activity intolerance will decrease Outcome: Progressing   Problem: Nutrition: Goal: Adequate nutrition will be maintained Outcome: Progressing   Problem: Coping: Goal: Level of anxiety will decrease Outcome: Progressing   Problem: Elimination: Goal: Will not experience complications related to bowel motility Outcome: Progressing Goal: Will not experience complications related to urinary retention Outcome: Progressing   Problem: Pain Managment: Goal: General experience of comfort will improve and/or be controlled Outcome: Progressing   Problem: Safety: Goal: Ability to remain free from injury will improve Outcome: Progressing   Problem: Skin Integrity: Goal: Risk for impaired skin integrity will decrease Outcome: Progressing

## 2024-05-20 NOTE — Progress Notes (Signed)
 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  Patient reports about 4 to 5 days of weakness prior to that, he was at a nail salon getting a pedicure, experienced excruciating pain with an ingrown toenail and he passed out.  He was brought to the hospital for syncope, but was found to have significant elevation in the white count as well as significant LFT elevation.  General surgery, GI, ID consulted  Subjective / 24h Interval events: He is doing well this morning.  He denies any abdominal pain, no nausea or vomiting.  He has no chest discomfort or shortness of breath.  He is able to eat without problems.  His wife is at bedside.  Assesement and Plan: Principal problem Cholangiocarcinoma, prior biliary stent placement and removal, history of cholecystectomy, elevated LFTs-most recent stent was removed September 2025 by Dr. [name].  He has a planned Whipple procedure on 11/13 with Dr. [name].  Imaging during this admission showed increase severe intra and extrahepatic bile duct dilatation with tapering at the level of the ampulla.  GI engaged, plan for ERCP today -Surgery consulted as well, Whipple deferred now until ERCP is done and T. bili is improving. - Continue to monitor  Active problems Gram-negative bacteremia, concern for cholangitis -blood cultures 11/8 with gram-negative's, Pseudomonas on BC ID.  ID consulted, currently he is on pip-tazo.  Sensitivities pending  Acute kidney injury -felt to be related to hypoperfusion/hypotension on admission from several days of decreased p.o. intake and weakness.  Creatinine continues to improve, it was as high as 3.7 on  admission, now down to 1.9 this morning. - Continue fluids as he is n.p.o. this morning  Essential hypertension-due to low normal BPs and acute kidney injury, continue to hold home Hyzaar  Hypokalemia-monitor potassium and continue to replete as indicated  Anemia of chronic disease, underlying malignancy-hemoglobin 9.2, stable, no bleeding  Syncopal episode-on admission, multifactorial in the setting of dehydration, vasovagal component given he passed out right after an episode of severe pain.  No further issues  Hypoalbuminemia -May complicate postoperative recovery, encourage p.o. intake  Scheduled Meds:  dorzolamide -timolol   1 drop Both Eyes BID   feeding supplement  237 mL Oral BID BM   heparin   5,000 Units Subcutaneous Q8H   latanoprost   1 drop Both Eyes QHS   melatonin  3 mg Oral QHS   Continuous Infusions:  piperacillin-tazobactam (ZOSYN)  IV 3.375 g (05/20/24 0531)   PRN Meds:.acetaminophen , polyethylene glycol  Current Outpatient Medications  Medication Instructions   ascorbic acid (VITAMIN C) 500 mg, Daily   aspirin EC 81 mg, Daily   benzonatate  (TESSALON ) 200 mg, 2 times daily PRN   bimatoprost (LUMIGAN) 0.03 % ophthalmic solution 1 drop, Every evening   dorzolamide -timolol  (COSOPT ) 2-0.5 % ophthalmic solution 1 drop, 2 times daily   losartan-hydrochlorothiazide (HYZAAR) 100-12.5 MG tablet 1 tablet, Daily   Multiple Vitamin (MULTIVITAMIN WITH MINERALS) TABS tablet 1 tablet, Daily   omeprazole  (PRILOSEC OTC) 20 mg, Daily PRN   ondansetron  (ZOFRAN ) 4 mg, Every 8 hours PRN   polyethylene glycol powder (GLYCOLAX /MIRALAX ) 17 g, Oral, Daily PRN, Mix in 4-8 ounces of liquid as directed.   Red Yeast Rice Extract (RED YEAST RICE PO)  1 capsule, Weekly    Diet Orders (From admission, onward)     Start     Ordered   05/20/24 0001  Diet NPO time specified Except for: Sips with Meds  Diet effective midnight       Question:  Except for  Answer:  Sips with Meds   05/19/24 1217             DVT prophylaxis: heparin  injection 5,000 Units Start: 05/16/24 2200   Lab Results  Component Value Date   PLT 446 (H) 05/20/2024      Code Status: Full Code  Family Communication: Wife at bedside  Status is: Inpatient Remains inpatient appropriate because: Severity of illness   Level of care: Telemetry  Consultants:  Gastroenterology General Surgery ID  Objective: Vitals:   05/19/24 1727 05/19/24 2046 05/20/24 0409 05/20/24 0827  BP: 112/70 100/66 109/68 117/74  Pulse: 63 69 (!) 53 (!) 55  Resp: 18 18 18 18   Temp: 98 F (36.7 C) 98.2 F (36.8 C) (!) 97.4 F (36.3 C) 97.6 F (36.4 C)  TempSrc:  Oral Oral   SpO2: 98% 98% (!) 89% 97%  Weight:      Height:        Intake/Output Summary (Last 24 hours) at 05/20/2024 0845 Last data filed at 05/20/2024 0416 Gross per 24 hour  Intake 1449.67 ml  Output 1400 ml  Net 49.67 ml   Wt Readings from Last 3 Encounters:  05/17/24 76.2 kg  04/20/24 80.9 kg  04/03/24 84.8 kg    Examination:  Constitutional: NAD Eyes: + scleral icterus ENMT: Mucous membranes are moist.  Neck: normal, supple Respiratory: clear to auscultation bilaterally, no wheezing, no crackles. Normal respiratory effort. Cardiovascular: Regular rate and rhythm, no murmurs / rubs / gallops. No LE edema.  Abdomen: non distended, no tenderness. Bowel sounds positive.  Musculoskeletal: no clubbing / cyanosis.    Data Reviewed: I have independently reviewed following labs and imaging studies   CBC Recent Labs  Lab 05/16/24 1645 05/16/24 1852 05/17/24 0342 05/18/24 0020 05/18/24 0243 05/19/24 0459 05/20/24 0327  WBC 15.1*  --  13.4* 17.8* 19.1* 17.0* 14.1*  HGB 10.7*   < > 8.8* 9.3* 9.0* 9.2* 9.2*  HCT 31.0*   < > 24.7* 26.6* 25.6* 25.7* 26.4*  PLT 495*  --  438* 441* 424* 414* 446*  MCV 81.8  --  79.9* 79.9* 80.3 79.3* 79.8*  MCH 28.2  --  28.5 27.9 28.2 28.4 27.8  MCHC 34.5  --  35.6 35.0 35.2 35.8 34.8  RDW 15.2  --  14.9  15.0 14.9 15.3 16.0*  LYMPHSABS 1.2  --   --   --   --   --   --   MONOABS 1.1*  --   --   --   --   --   --   EOSABS 0.0  --   --   --   --   --   --   BASOSABS 0.0  --   --   --   --   --   --    < > = values in this interval not displayed.    Recent Labs  Lab 05/16/24 1645 05/16/24 1852 05/16/24 2357 05/17/24 0342 05/17/24 0758 05/18/24 0020 05/18/24 0243 05/19/24 0459 05/20/24 0327  NA 135 140  --  137  --  139 141 145 147*  K 2.8* 2.6*  --  3.1*  --  3.3* 3.3* 3.1* 3.9  CL 97* 101  --  103  --  105 106 110 113*  CO2 23  --   --  24  --  23 22 23  21*  GLUCOSE 148* 113*  --  115*  --  124* 150* 95 100*  BUN 64* 48*  --  56*  --  47* 43* 34* 30*  CREATININE 3.70* 3.30* 3.19* 3.20*  --  2.70* 2.61* 2.28* 1.95*  CALCIUM 8.4*  --   --  7.8*  --  7.9* 7.9* 8.3* 8.3*  AST 68*  --   --  53*  --  64* 55* 57* 60*  ALT 59*  --   --  52*  --  54* 50* 50* 45*  ALKPHOS 312*  --   --  266*  --  299* 276* 255* 233*  BILITOT 19.6*  --   --  17.6*  --  18.2* 17.1* 14.8* 13.5*  ALBUMIN 1.9*  --   --  <1.5*  --  1.6* <1.5* <1.5* <1.5*  MG  --   --  1.9  --   --   --   --  1.7  --   CRP  --   --   --   --  17.8*  --   --   --   --   PROCALCITON  --   --   --   --  1.88  --   --   --   --   LATICACIDVEN  --  0.9  --   --  0.8  --   --   --   --   INR  --   --   --   --   --  1.3*  --   --   --   BNP 46.3  --   --   --   --   --   --   --   --     ------------------------------------------------------------------------------------------------------------------ No results for input(s): CHOL, HDL, LDLCALC, TRIG, CHOLHDL, LDLDIRECT in the last 72 hours.  No results found for: HGBA1C ------------------------------------------------------------------------------------------------------------------ No results for input(s): TSH, T4TOTAL, T3FREE, THYROIDAB in the last 72 hours.  Invalid input(s): FREET3  Cardiac Enzymes No results for input(s): CKMB, TROPONINI,  MYOGLOBIN in the last 168 hours.  Invalid input(s): CK ------------------------------------------------------------------------------------------------------------------    Component Value Date/Time   BNP 46.3 05/16/2024 1645    CBG: No results for input(s): GLUCAP in the last 168 hours.  Recent Results (from the past 240 hours)  Urine Culture (for pregnant, neutropenic or urologic patients or patients with an indwelling urinary catheter)     Status: Abnormal   Collection Time: 05/17/24  1:21 AM   Specimen: Urine, Clean Catch  Result Value Ref Range Status   Specimen Description URINE, CLEAN CATCH  Final   Special Requests   Final    NONE Performed at [facility], [address], [place], [place] [address]    Culture >=100,000 COLONIES/mL ENTEROCOCCUS FAECALIS (A)  Final   Report Status 05/19/2024 FINAL  Final   Organism ID, Bacteria ENTEROCOCCUS FAECALIS (A)  Final      Susceptibility   Enterococcus faecalis - MIC*    AMPICILLIN  <=2 SENSITIVE Sensitive     NITROFURANTOIN <=16 SENSITIVE Sensitive     VANCOMYCIN 1 SENSITIVE Sensitive     * >=100,000 COLONIES/mL ENTEROCOCCUS FAECALIS  Culture, blood (Routine X 2) w Reflex to ID Panel     Status: None (Preliminary result)  Collection Time: 05/17/24  9:33 PM   Specimen: BLOOD  Result Value Ref Range Status   Specimen Description BLOOD SITE NOT SPECIFIED  Final   Special Requests   Final    BOTTLES DRAWN AEROBIC AND ANAEROBIC Blood Culture adequate volume   Culture  Setup Time   Final    GRAM NEGATIVE RODS AEROBIC BOTTLE ONLY RBV [place] PHARMD 05/19/2024 @0647  BY DD Performed at [facility], [address], [place], [place] [address]    Culture GRAM NEGATIVE RODS  Final   Report Status PENDING  Incomplete  Blood Culture ID Panel (Reflexed)     Status: Abnormal   Collection Time: 05/17/24  9:33 PM  Result Value Ref Range Status   Enterococcus faecalis NOT DETECTED NOT DETECTED Final   Enterococcus  Faecium NOT DETECTED NOT DETECTED Final   Listeria monocytogenes NOT DETECTED NOT DETECTED Final   Staphylococcus species NOT DETECTED NOT DETECTED Final   Staphylococcus aureus (BCID) NOT DETECTED NOT DETECTED Final   Staphylococcus epidermidis NOT DETECTED NOT DETECTED Final   Staphylococcus lugdunensis NOT DETECTED NOT DETECTED Final   Streptococcus species NOT DETECTED NOT DETECTED Final   Streptococcus agalactiae NOT DETECTED NOT DETECTED Final   Streptococcus pneumoniae NOT DETECTED NOT DETECTED Final   Streptococcus pyogenes NOT DETECTED NOT DETECTED Final   A.calcoaceticus-baumannii NOT DETECTED NOT DETECTED Final   Bacteroides fragilis NOT DETECTED NOT DETECTED Final   Enterobacterales NOT DETECTED NOT DETECTED Final   Enterobacter cloacae complex NOT DETECTED NOT DETECTED Final   Escherichia coli NOT DETECTED NOT DETECTED Final   Klebsiella aerogenes NOT DETECTED NOT DETECTED Final   Klebsiella oxytoca NOT DETECTED NOT DETECTED Final   Klebsiella pneumoniae NOT DETECTED NOT DETECTED Final   Proteus species NOT DETECTED NOT DETECTED Final   Salmonella species NOT DETECTED NOT DETECTED Final   Serratia marcescens NOT DETECTED NOT DETECTED Final   Haemophilus influenzae NOT DETECTED NOT DETECTED Final   Neisseria meningitidis NOT DETECTED NOT DETECTED Final   Pseudomonas aeruginosa DETECTED (A) NOT DETECTED Final    Comment: CRITICAL RESULT CALLED TO, READ BACK BY AND VERIFIED WITH: RBV [place] PHARMD 05/19/2024 @0647  BY DD    Stenotrophomonas maltophilia NOT DETECTED NOT DETECTED Final   Candida albicans NOT DETECTED NOT DETECTED Final   Candida auris NOT DETECTED NOT DETECTED Final   Candida glabrata NOT DETECTED NOT DETECTED Final   Candida krusei NOT DETECTED NOT DETECTED Final   Candida parapsilosis NOT DETECTED NOT DETECTED Final   Candida tropicalis NOT DETECTED NOT DETECTED Final   Cryptococcus neoformans/gattii NOT DETECTED NOT DETECTED Final   CTX-M ESBL NOT DETECTED  NOT DETECTED Final   Carbapenem resistance IMP NOT DETECTED NOT DETECTED Final   Carbapenem resistance KPC NOT DETECTED NOT DETECTED Final   Carbapenem resistance NDM NOT DETECTED NOT DETECTED Final   Carbapenem resistance VIM NOT DETECTED NOT DETECTED Final    Comment: Performed at [facility], [address] [place], [place] [address]  Culture, blood (Routine X 2) w Reflex to ID Panel     Status: None (Preliminary result)   Collection Time: 05/17/24  9:38 PM   Specimen: BLOOD  Result Value Ref Range Status   Specimen Description BLOOD SITE NOT SPECIFIED  Final   Special Requests   Final    BOTTLES DRAWN AEROBIC AND ANAEROBIC Blood Culture adequate volume   Culture  Setup Time   Final    GRAM NEGATIVE RODS AEROBIC BOTTLE ONLY CRITICAL VALUE NOTED.  VALUE IS CONSISTENT WITH PREVIOUSLY REPORTED AND CALLED VALUE.    Culture   Final    NO GROWTH 3 DAYS Performed at [facility], [address], [place], [place] [address]    Report Status PENDING  Incomplete     Radiology Studies: No results found.   Nilda Fendt, MD, PhD [facility]  Between 7 am - 7 pm I am available, please contact me via Amion (for emergencies) or Securechat (non urgent messages)  Between 7 pm - 7 am I am not available, please contact night coverage MD/APP via Amion

## 2024-05-20 NOTE — Transfer of Care (Signed)
 Immediate Anesthesia Transfer of Care Note  Patient: Andrew Douglas  Procedure(s) Performed: ERCP, WITH INTERVENTION IF INDICATED  Patient Location: PACU  Anesthesia Type:General  Level of Consciousness: awake  Airway & Oxygen Therapy: Patient Spontanous Breathing  Post-op Assessment: Report given to RN and Post -op Vital signs reviewed and stable  Post vital signs: Reviewed and stable  Last Vitals:  Vitals Value Taken Time  BP 96/67 05/20/24 11:21  Temp    Pulse 64 05/20/24 11:23  Resp 17 05/20/24 11:23  SpO2 95 % 05/20/24 11:23  Vitals shown include unfiled device data.  Last Pain:  Vitals:   05/20/24 0953  TempSrc: Temporal  PainSc: 0-No pain         Complications: No notable events documented.

## 2024-05-20 NOTE — Interval H&P Note (Signed)
 History and Physical Interval Note:  05/20/2024 9:55 AM  Andrew Douglas  has presented today for surgery, with the diagnosis of cholangiocarcinoma, cholangitis, bile duct obstruction.  The various methods of treatment have been discussed with the patient and family. After consideration of risks, benefits and other options for treatment, the patient has consented to  Procedure(s): ERCP, WITH INTERVENTION IF INDICATED (N/A) as a surgical intervention.  The patient's history has been reviewed, patient examined, no change in status, stable for surgery.  I have reviewed the patient's chart and labs.  Questions were answered to the patient's satisfaction.     [name]

## 2024-05-20 NOTE — Op Note (Addendum)
 [facility] Patient Name: Andrew Douglas Procedure Date : 05/20/2024 MRN: [id number] Attending MD: Lupita FORBES Commander , MD, [id number] Date of Birth: [birth year] CSN: [id number] Age: 78 Admit Type: Inpatient Procedure:                ERCP Indications:              Suspected ascending cholangitis, For therapy of                            ascending cholangitis, Jaundice, Cholangiocarcinoma Providers:                Lupita CHARLENA Commander, MD, Collene Edu, RN, [facility]                            [place], Technician Referring MD:              Medicines:                General Anesthesia Complications:            No immediate complications. Estimated Blood Loss:     Estimated blood loss: none. Procedure:                Pre-Anesthesia Assessment:                           - Prior to the procedure, a History and Physical                            was performed, and patient medications and                            allergies were reviewed. The patient's tolerance of                            previous anesthesia was also reviewed. The risks                            and benefits of the procedure and the sedation                            options and risks were discussed with the patient.                            All questions were answered, and informed consent                            was obtained. Prior Anticoagulants: The patient                            last took heparin  on the day of the procedure. ASA                            Grade Assessment: III - A patient with severe  systemic disease. After reviewing the risks and                            benefits, the patient was deemed in satisfactory                            condition to undergo the procedure.                           After obtaining informed consent, the scope was                            passed under direct vision. Throughout the                            procedure, the  patient's blood pressure, pulse, and                            oxygen saturations were monitored continuously. The                            [employer] single use                            duodenoscope was introduced through the mouth, and                            used to inject contrast into and used to inject                            contrast into the bile duct. The ERCP was                            accomplished without difficulty. The patient                            tolerated the procedure well. The patient was on                            continuous intermittent Zosyn, and diclofenac  100                            mg per rectum was administered. Scope In: Scope Out: Findings:      The esophagus was not seen well due to the side-viewing nature of the       scope. The stomach was grossly normal, the duodenum contained a proximal       second portion of the duodenum diverticulum, the papilla was distal to       this, there was evidence of prior biliary sphincterotomy that was not       wide open. There was some stenosis in the papillary orifice was somewhat       friable with contact bleeding. The papillary orifice was stenosed. Deep       cannulation of the bile duct was obtained using  a wire-guided approach       with an Rx 44 sphincterotome and a 0.035 Hydra Jagwire. Contrast was       injected the bile duct was very enlarged. I switched over to a 12-15       stone extraction balloon. Occlusion cholangiogram revealed markedly       dilated biliary tree with a maximum diameter 16 to 18 mm. There was a       very distal biliary stricture. Status postcholecystectomy. Copious       purulent drainage was observed with biliary sweeps as well as sludge and       fragments of bile duct stones. Balloon was removed and I placed a 10 x       40 mm covered self-expanding metal stent. Further copious drainage of       purulent fluid/bile after that. Impression:                - A single localized biliary stricture secondary to                            known cholangiocarcinoma was found in the lower                            third of the main bile duct. This caused diffused                            biliary tree dilation maximum 18 mm.                           - This stricture was treated with stent placement.                            10 x 40 mm covered self-expanding metal stent.                           - Ascending cholangitis was found.                           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                           Continue Zosyn while hospitalized - would aim for                            5-7 days antibiotics total (can be po at dc)  [facility] to assume care and follow-up                            as I was covering on-call.                           Surgical plans per Dr. [name]                           I updated wife and brother by phone Procedure Code(s):        --- Professional ---                           [phone], Endoscopic retrograde                            cholangiopancreatography (ERCP); with placement of                            endoscopic stent into biliary or pancreatic duct,                            including pre- and post-dilation and guide wire                            passage, when performed, including sphincterotomy,                            when performed, each stent                           43264, Endoscopic retrograde                            cholangiopancreatography (ERCP); with removal of                            calculi/debris from biliary/pancreatic duct(s)                           25671, Endoscopic catheterization of the biliary                             ductal system, radiological supervision and                            interpretation Diagnosis Code(s):        --- Professional ---                           K83.1, Obstruction of bile duct                           K83.09, Other cholangitis                           R17, Unspecified jaundice  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 All rights reserved. The codes documented in this report are preliminary and upon coder review may  be revised to meet current compliance requirements. Lupita FORBES Commander, MD 05/20/2024 11:30:21 AM This report has been signed electronically. Number of Addenda: 0

## 2024-05-20 NOTE — Anesthesia Postprocedure Evaluation (Signed)
 Anesthesia Post Note  Patient: Andrew Douglas  Procedure(s) Performed: ERCP, WITH INTERVENTION IF INDICATED     Patient location during evaluation: PACU Anesthesia Type: General Level of consciousness: sedated and patient cooperative Pain management: pain level controlled Vital Signs Assessment: post-procedure vital signs reviewed and stable Respiratory status: spontaneous breathing Cardiovascular status: stable Anesthetic complications: no   No notable events documented.  Last Vitals:  Vitals:   05/20/24 1144 05/20/24 1214  BP: 108/77 95/61  Pulse: 64 62  Resp: 15 18  Temp:  (!) 36.4 C  SpO2: 96% 97%    Last Pain:  Vitals:   05/20/24 1144  TempSrc:   PainSc: 0-No pain                 [name]

## 2024-05-20 NOTE — Anesthesia Procedure Notes (Signed)
 Procedure Name: Intubation Date/Time: 05/20/2024 10:16 AM  Performed by: Virgil Ee, CRNAPre-anesthesia Checklist: Patient identified, Patient being monitored, Timeout performed, Emergency Drugs available and Suction available Patient Re-evaluated:Patient Re-evaluated prior to induction Oxygen Delivery Method: Circle system utilized Preoxygenation: Pre-oxygenation with 100% oxygen Induction Type: IV induction Ventilation: Mask ventilation without difficulty Laryngoscope Size: Mac and 4 Grade View: Grade I Tube type: Oral Tube size: 7.0 mm Number of attempts: 1 Airway Equipment and Method: Stylet Placement Confirmation: ETT inserted through vocal cords under direct vision, positive ETCO2 and breath sounds checked- equal and bilateral Secured at: 24 cm Tube secured with: Tape Dental Injury: Teeth and Oropharynx as per pre-operative assessment

## 2024-05-21 ENCOUNTER — Encounter (HOSPITAL_COMMUNITY): Payer: Self-pay | Admitting: Internal Medicine

## 2024-05-21 DIAGNOSIS — R55 Syncope and collapse: Secondary | ICD-10-CM | POA: Diagnosis not present

## 2024-05-21 DIAGNOSIS — B965 Pseudomonas (aeruginosa) (mallei) (pseudomallei) as the cause of diseases classified elsewhere: Secondary | ICD-10-CM

## 2024-05-21 DIAGNOSIS — R7881 Bacteremia: Secondary | ICD-10-CM | POA: Insufficient documentation

## 2024-05-21 DIAGNOSIS — C249 Malignant neoplasm of biliary tract, unspecified: Secondary | ICD-10-CM | POA: Diagnosis not present

## 2024-05-21 LAB — CBC
HCT: 27.9 % — ABNORMAL LOW (ref 39.0–52.0)
Hemoglobin: 9.7 g/dL — ABNORMAL LOW (ref 13.0–17.0)
MCH: 28 pg (ref 26.0–34.0)
MCHC: 34.8 g/dL (ref 30.0–36.0)
MCV: 80.6 fL (ref 80.0–100.0)
Platelets: 420 K/uL — ABNORMAL HIGH (ref 150–400)
RBC: 3.46 MIL/uL — ABNORMAL LOW (ref 4.22–5.81)
RDW: 16.4 % — ABNORMAL HIGH (ref 11.5–15.5)
WBC: 9.9 K/uL (ref 4.0–10.5)
nRBC: 0 % (ref 0.0–0.2)

## 2024-05-21 LAB — CULTURE, BLOOD (ROUTINE X 2)
Culture: NO GROWTH — AB
Special Requests: ADEQUATE
Special Requests: ADEQUATE

## 2024-05-21 LAB — COMPREHENSIVE METABOLIC PANEL WITH GFR
ALT: 45 U/L — ABNORMAL HIGH (ref 0–44)
AST: 60 U/L — ABNORMAL HIGH (ref 15–41)
Albumin: <1.5 g/dL — ABNORMAL LOW (ref 3.5–5.0)
Alkaline Phosphatase: 217 U/L — ABNORMAL HIGH (ref 38–126)
Anion gap: 11 (ref 5–15)
BUN: 30 mg/dL — ABNORMAL HIGH (ref 8–23)
CO2: 22 mmol/L (ref 22–32)
Calcium: 8.1 mg/dL — ABNORMAL LOW (ref 8.9–10.3)
Chloride: 111 mmol/L (ref 98–111)
Creatinine, Ser: 1.82 mg/dL — ABNORMAL HIGH (ref 0.61–1.24)
GFR, Estimated: 37 mL/min — ABNORMAL LOW (ref >60)
Glucose, Bld: 80 mg/dL (ref 70–99)
Potassium: 3.3 mmol/L — ABNORMAL LOW (ref 3.5–5.1)
Sodium: 144 mmol/L (ref 135–145)
Total Bilirubin: 11.4 mg/dL — ABNORMAL HIGH (ref 0.0–1.2)
Total Protein: 5.6 g/dL — ABNORMAL LOW (ref 6.5–8.1)

## 2024-05-21 MED ORDER — DOCUSATE SODIUM 100 MG PO CAPS
100.0000 mg | ORAL_CAPSULE | Freq: Two times a day (BID) | ORAL | Status: DC
  Administered 2024-05-21 – 2024-05-22 (×3): 100 mg via ORAL
  Filled 2024-05-21 (×3): qty 1

## 2024-05-21 MED ORDER — CIPROFLOXACIN HCL 500 MG PO TABS
500.0000 mg | ORAL_TABLET | Freq: Two times a day (BID) | ORAL | Status: DC
  Administered 2024-05-21 – 2024-05-22 (×3): 500 mg via ORAL
  Filled 2024-05-21 (×3): qty 1

## 2024-05-21 MED ORDER — POTASSIUM CHLORIDE 20 MEQ PO PACK
40.0000 meq | PACK | Freq: Once | ORAL | Status: AC
  Administered 2024-05-21: 40 meq via ORAL
  Filled 2024-05-21: qty 2

## 2024-05-21 MED ORDER — SODIUM CHLORIDE 0.9 % IV SOLN: INTRAVENOUS | Status: AC

## 2024-05-21 NOTE — Plan of Care (Signed)
  Problem: Health Behavior/Discharge Planning: Goal: Ability to manage health-related needs will improve Outcome: Progressing   Problem: Clinical Measurements: Goal: Ability to maintain clinical measurements within normal limits will improve Outcome: Progressing Goal: Will remain free from infection Outcome: Progressing Goal: Diagnostic test results will improve Outcome: Progressing Goal: Respiratory complications will improve Outcome: Progressing Goal: Cardiovascular complication will be avoided Outcome: Progressing   Problem: Activity: Goal: Risk for activity intolerance will decrease Outcome: Progressing   Problem: Nutrition: Goal: Adequate nutrition will be maintained Outcome: Progressing   Problem: Coping: Goal: Level of anxiety will decrease Outcome: Progressing   Problem: Elimination: Goal: Will not experience complications related to bowel motility Outcome: Progressing Goal: Will not experience complications related to urinary retention Outcome: Progressing   Problem: Pain Managment: Goal: General experience of comfort will improve and/or be controlled Outcome: Progressing   Problem: Safety: Goal: Ability to remain free from injury will improve Outcome: Progressing   Problem: Skin Integrity: Goal: Risk for impaired skin integrity will decrease Outcome: Progressing   Problem: Education: Goal: Knowledge of General Education information will improve Description: Including pain rating scale, medication(s)/side effects and non-pharmacologic comfort measures Outcome: Progressing   Problem: Health Behavior/Discharge Planning: Goal: Ability to manage health-related needs will improve Outcome: Progressing   Problem: Clinical Measurements: Goal: Ability to maintain clinical measurements within normal limits will improve Outcome: Progressing Goal: Will remain free from infection Outcome: Progressing Goal: Diagnostic test results will improve Outcome:  Progressing Goal: Respiratory complications will improve Outcome: Progressing Goal: Cardiovascular complication will be avoided Outcome: Progressing   Problem: Activity: Goal: Risk for activity intolerance will decrease Outcome: Progressing   Problem: Nutrition: Goal: Adequate nutrition will be maintained Outcome: Progressing   Problem: Coping: Goal: Level of anxiety will decrease Outcome: Progressing   Problem: Elimination: Goal: Will not experience complications related to bowel motility Outcome: Progressing Goal: Will not experience complications related to urinary retention Outcome: Progressing   Problem: Pain Managment: Goal: General experience of comfort will improve and/or be controlled Outcome: Progressing   Problem: Safety: Goal: Ability to remain free from injury will improve Outcome: Progressing   Problem: Skin Integrity: Goal: Risk for impaired skin integrity will decrease Outcome: Progressing

## 2024-05-21 NOTE — Evaluation (Signed)
 Physical Therapy Evaluation Patient Details Name: Andrew Douglas MRN: [id number] DOB: 10-03-1945 Today's Date: 05/21/2024  History of Present Illness  78 year old male with cholangiocarcinoma with prior obstruction and stent placement followed by removal, cholecystectomy;  comes into the hospital with a syncopal episode.  Patient reports about 4 to 5 days of weakness prior to that, he was at a nail salon getting a pedicure, experienced excruciating pain with an ingrown toenail and he passed out.  He was brought to the hospital for syncope, but was found to have significant elevation in the white count as well as significant LFT elevation; successful ERCP on 11/9  Clinical Impression   Pt admitted with above diagnosis. 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 Pt will benefit from skilled PT to increase their independence and safety with mobility to allow discharge to the venue listed below.           If plan is discharge home, recommend the following: A little help with walking and/or transfers;Assistance with cooking/housework;Assist for transportation   Can travel by private vehicle        Equipment Recommendations Cane  Recommendations for Other Services       Functional Status Assessment Patient has had a recent decline in their functional status and demonstrates the ability to make significant improvements in function in a reasonable and predictable  amount of time.     Precautions / Restrictions Precautions Precautions: Fall Recall of Precautions/Restrictions: Intact Restrictions Weight Bearing Restrictions Per Provider Order: No      Mobility  Bed Mobility Overal bed mobility: Needs Assistance Bed Mobility: Supine to Sit     Supine to sit: Supervision     General bed mobility comments: incr time and used bed features    Transfers Overall transfer level: Needs assistance Equipment used: None Transfers: Sit to/from Stand Sit to Stand: Supervision           General transfer comment: mild unsteadiness at initial stand; noting tendency to reach out for UE support    Ambulation/Gait Ambulation/Gait assistance: Contact guard assist (with and without physical contact) Gait Distance (Feet): 120 Feet Assistive device: None, IV Pole Gait Pattern/deviations: Step-through pattern       General Gait Details: Mildly unsteady but with no overt loss of balance; seems to be more comfortable with at least unilateral UE support, holding IV pole  Stairs            Wheelchair Mobility     Tilt Bed    Modified Rankin (Stroke Patients Only)       Balance Overall balance assessment: Mild deficits observed, not formally tested (at this point, seems more comfortable with)                                           Pertinent Vitals/Pain Pain Assessment Pain Assessment: Faces Faces Pain Scale: Hurts a little bit Pain Location: generalized and some abdominal discomfort Pain  Descriptors / Indicators: Grimacing Pain Intervention(s): Monitored during session    Home Living Family/patient expects to be discharged to:: Private residence Living Arrangements: Spouse/significant other Available Help at Discharge: Family Type of Home: House Home Access: Stairs to enter Entrance Stairs-Rails: None Secretary/administrator of Steps: 2   Home Layout: One level Home Equipment: None Additional Comments:  Owns a painting business    Prior Function Prior Level of Function : Independent/Modified Independent             Mobility Comments: no assistive device ADLs Comments: independent, drives     Extremity/Trunk Assessment   Upper Extremity Assessment Upper Extremity Assessment: Overall WFL for tasks assessed (for simple takss)    Lower Extremity Assessment Lower Extremity Assessment: Generalized weakness (pt attributes to spending a few days in bed)    Cervical / Trunk Assessment Cervical / Trunk Assessment: Normal  Communication   Communication Communication: No apparent difficulties    Cognition Arousal: Alert Behavior During Therapy: WFL for tasks assessed/performed   PT - Cognitive impairments: No apparent impairments                         Following commands: Intact       Cueing Cueing Techniques: Verbal cues     General Comments General comments (skin integrity, edema, etc.): Obtained orthostatic vitals, noting drop in SBP over 20 mmHg from supine to standing; better with standing at 3 minutes (including marching in place); Ultimately, after walking in hallway, SBP in stading was down 27 mmHg compared to BPsupine; asymptomatic throughout;   05/21/24 1124 05/21/24 1138  Vital Signs  Patient Position (if appropriate) Orthostatic Vitals  --   Orthostatic Lying   BP- Lying 127/78  --   Pulse- Lying 62  --   Orthostatic Sitting  BP- Sitting 107/82  --   Pulse- Sitting 66  --   Orthostatic Standing at 0 minutes  BP- Standing at 0 minutes 91/72  --   Pulse- Standing at 0 minutes 80  --   Orthostatic Standing at 3 minutes  BP- Standing at 3 minutes 116/86 100/74  Pulse- Standing at 3 minutes 84 82 (after at least 10 minutes of upright activity iincluding hallway amb)       Exercises     Assessment/Plan    PT Assessment Patient needs continued PT services  PT Problem List Decreased activity tolerance;Decreased balance;Decreased knowledge of  use of DME       PT Treatment Interventions DME instruction;Gait training;Stair training;Functional mobility training;Therapeutic activities;Therapeutic exercise;Balance training;Patient/family education;Manual techniques    PT Goals (Current goals can be found in the Care Plan section)  Acute Rehab PT Goals Patient Stated Goal: get home and get stronger and back to independence PT Goal Formulation: With patient Time For Goal Achievement: 06/04/24 Potential to Achieve Goals: Good    Frequency Min 2X/week     Co-evaluation               AM-PAC PT 6 Clicks Mobility  Outcome Measure Help needed turning from your back to your side while in a flat bed without using bedrails?: None Help needed moving from lying on your back to sitting on the side of a flat bed without using bedrails?: None Help needed moving to and from a bed to a chair (including a wheelchair)?: A Little Help needed standing up from a chair using your arms (e.g., wheelchair or bedside chair)?: A Little Help needed to walk in hospital  room?: A Little Help needed climbing 3-5 steps with a railing? : A Little 6 Click Score: 20    End of Session Equipment Utilized During Treatment: Gait belt Activity Tolerance: Patient tolerated treatment well Patient left: in chair;with call bell/phone within reach;with chair alarm set Nurse Communication: Mobility status PT Visit Diagnosis: Unsteadiness on feet (R26.81)    Time: 8884-8854 PT Time Calculation (min) (ACUTE ONLY): 30 min   Charges:   PT Evaluation $PT Eval Low Complexity: 1 Low PT Treatments $Gait Training: 8-22 mins PT General Charges $$ ACUTE PT VISIT: 1 Visit         Silvano Currier, PT  Acute Rehabilitation Services Office [phone] Secure Chat welcomed   Silvano VEAR Currier 05/21/2024, 12:44 PM

## 2024-05-21 NOTE — Progress Notes (Signed)
 Subjective: Denies abdominal pain, states he feels well after ERCP with stent placement.  Tolerating clear liquids for breakfast.   Objective: Vital signs in last 24 hours: Temp:  [97.4 F (36.3 C)-97.9 F (36.6 C)] 97.4 F (36.3 C) (11/10 0422) Pulse Rate:  [54-73] 54 (11/10 0422) Resp:  [15-20] 18 (11/10 0422) BP: (95-122)/(61-79) 97/67 (11/10 0422) SpO2:  [95 %-100 %] 98 % (11/10 0422) Weight change:  Last BM Date : 05/18/24  PE: Icteric GENERAL: Mild pallor  ABDOMEN: Nondistended, nontender, normoactive bowel sounds EXTREMITIES: No deformity  Lab Results: Results for orders placed or performed during the hospital encounter of 05/16/24 (from the past 48 hours)  CBC     Status: Abnormal   Collection Time: 05/20/24  3:27 AM  Result Value Ref Range   WBC 14.1 (H) 4.0 - 10.5 K/uL   RBC 3.31 (L) 4.22 - 5.81 MIL/uL   Hemoglobin 9.2 (L) 13.0 - 17.0 g/dL   HCT 73.5 (L) 60.9 - 47.9 %   MCV 79.8 (L) 80.0 - 100.0 fL   MCH 27.8 26.0 - 34.0 pg   MCHC 34.8 30.0 - 36.0 g/dL   RDW 83.9 (H) 88.4 - 84.4 %   Platelets 446 (H) 150 - 400 K/uL   nRBC 0.0 0.0 - 0.2 %    Comment: Performed at [facility], [address] [place], [place] [address]  Comprehensive metabolic panel with GFR     Status: Abnormal   Collection Time: 05/20/24  3:27 AM  Result Value Ref Range   Sodium 147 (H) 135 - 145 mmol/L   Potassium 3.9 3.5 - 5.1 mmol/L    Comment: DELTA CHECK NOTED   Chloride 113 (H) 98 - 111 mmol/L   CO2 21 (L) 22 - 32 mmol/L   Glucose, Bld 100 (H) 70 - 99 mg/dL    Comment: Glucose reference range applies only to samples taken after fasting for at least 8 hours.   BUN 30 (H) 8 - 23 mg/dL   Creatinine, Ser 8.04 (H) 0.61 - 1.24 mg/dL   Calcium 8.3 (L) 8.9 - 10.3 mg/dL   Total Protein 5.9 (L) 6.5 - 8.1 g/dL   Albumin <8.4 (L) 3.5 - 5.0 g/dL   AST 60 (H) 15 - 41 U/L   ALT 45 (H) 0 - 44 U/L   Alkaline Phosphatase 233 (H) 38 - 126 U/L   Total Bilirubin 13.5 (H) 0.0 - 1.2 mg/dL    GFR, Estimated 34 (L) >60 mL/min    Comment: (NOTE) Calculated using the CKD-EPI Creatinine Equation (2021)    Anion gap 13 5 - 15    Comment: Performed at [facility], [address] [place], [place] [address]  CBC     Status: Abnormal   Collection Time: 05/21/24  4:55 AM  Result Value Ref Range   WBC 9.9 4.0 - 10.5 K/uL   RBC 3.46 (L) 4.22 - 5.81 MIL/uL   Hemoglobin 9.7 (L) 13.0 - 17.0 g/dL   HCT 72.0 (L) 60.9 - 47.9 %   MCV 80.6 80.0 - 100.0 fL   MCH 28.0 26.0 - 34.0 pg   MCHC 34.8 30.0 - 36.0 g/dL   RDW 83.5 (H) 88.4 - 84.4 %   Platelets 420 (H) 150 - 400 K/uL   nRBC 0.0 0.0 - 0.2 %    Comment: Performed at [facility], [address], [place], [place] [address]  Comprehensive metabolic panel with GFR  Status: Abnormal   Collection Time: 05/21/24  4:55 AM  Result Value Ref Range   Sodium 144 135 - 145 mmol/L   Potassium 3.3 (L) 3.5 - 5.1 mmol/L   Chloride 111 98 - 111 mmol/L   CO2 22 22 - 32 mmol/L   Glucose, Bld 80 70 - 99 mg/dL    Comment: Glucose reference range applies only to samples taken after fasting for at least 8 hours.   BUN 30 (H) 8 - 23 mg/dL   Creatinine, Ser 8.17 (H) 0.61 - 1.24 mg/dL   Calcium 8.1 (L) 8.9 - 10.3 mg/dL   Total Protein 5.6 (L) 6.5 - 8.1 g/dL   Albumin <8.4 (L) 3.5 - 5.0 g/dL   AST 60 (H) 15 - 41 U/L   ALT 45 (H) 0 - 44 U/L   Alkaline Phosphatase 217 (H) 38 - 126 U/L   Total Bilirubin 11.4 (H) 0.0 - 1.2 mg/dL   GFR, Estimated 37 (L) >60 mL/min    Comment: (NOTE) Calculated using the CKD-EPI Creatinine Equation (2021)    Anion gap 11 5 - 15    Comment: Performed at [facility], [address], [place], [place] [address]    Studies/Results: DG ERCP Result Date: 05/20/2024 CLINICAL DATA:  Jaundice and history of cholangiocarcinoma with suspicion for ascending cholangitis. EXAM: ERCP 5 intra procedural fluoroscopic images and 25 cine loops TECHNIQUE: Multiple spot images obtained with the fluoroscopic device and  submitted for interpretation post-procedure. FLUOROSCOPY: Radiation Exposure Index (as provided by the fluoroscopic device): 63.77 mGy Kerma COMPARISON:  None Available. FINDINGS: Fluoroscopic images demonstrate surgical clips in the right upper quadrant consistent with previous cholecystectomy. Flexible endoscopy device with guidewire extending into the common bile duct. Multiple 7A loops demonstrate difficulty accessing the ampulla and common bile duct. Contrast injection into the common bile duct demonstrates the proximal common bile duct and hepatic ducts to be markedly dilated. Dilatation likely from distal/ampullary occlusion. Procedural placement of metallic stent at the ampulla no contrast extravasation identified. IMPRESSION: ERCP with placement of metallic stent in the common bile duct. These images were submitted for radiologic interpretation only. Please see the procedural report for the amount of contrast and the fluoroscopy time utilized. Electronically Signed   By: Cordella Banner   On: 05/20/2024 18:47    Medications: I have reviewed the patient's current medications.  Assessment: Distal cholangiocarcinoma with ascending cholangitis  Status post ERCP with Dr. [name] on 05/20/2024 with placement of 10 x 40 mm covered metal stent with copious amount of purulent drainage noted with biliary sweep along with sludge and fragments of bile duct stone removal  Interval elevated LFTs T. bili elevated 11.4/AST 60/ALT 25/ALT 217 WBC has normalized to 9.9 Normocytic anemia, hemoglobin 9.7, MCV 80.6, reactive thrombocytosis platelets 420   Plan: I have advanced his diet to regular.  Continue IV Zosyn while patient remains inpatient, thereafter recommend oral antibiotics for 7 additional days.  Patient eventually will need plan for Whipple's procedure by Dr. [name].  At this point, GI will sign, please recall if needed.   Estelita Manas, MD 05/21/2024, 7:47 AM

## 2024-05-21 NOTE — Progress Notes (Signed)
 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  Patient reports about 4 to 5 days of weakness prior to that, he was at a nail salon getting a pedicure, experienced excruciating pain with an ingrown toenail and he passed out.  He was brought to the hospital for syncope, but was found to have significant elevation in the white count as well as significant LFT elevation.  General surgery, GI, ID consulted  Subjective / 24h Interval events: He is feeling well, no nausea / vomiting.   Assesement and Plan: Principal problem Cholangiocarcinoma, prior biliary stent placement and removal, history of cholecystectomy, elevated LFTs-most recent stent was removed September 2025 by Dr. [name].  He has a planned Whipple procedure on 11/13 with Dr. [name].  Imaging during this admission showed increase severe intra and extrahepatic bile duct dilatation with tapering at the level of the ampulla.  Status post successful ERCP 11/9 -Surgery consulted as well, his Whipple surgery is now deferred.  Discussed with Dr. [name] - Continue to monitor LFTs, if continue to improve plan for him to go home tomorrow  Active problems Gram-negative bacteremia, concern for cholangitis -blood cultures 11/8 with gram-negative's, Pseudomonas on BC ID.  ID consulted, currently he is on pip-tazo.  Pseudomonas is pansensitive, ID to weigh in today about length of treatment  Acute kidney injury -felt to be related to hypoperfusion/hypotension on admission from several days of decreased p.o. intake and weakness.  Creatinine continues to improve, it was as high as 3.7 on admission, now down to 1.8 this morning. -  Keep on fluids for another day  Essential hypertension-due to low normal BPs and acute kidney injury, continue to hold home Hyzaar  Hypokalemia-replace again potassium  Anemia of chronic disease, underlying malignancy-hemoglobin 9.2, stable, no bleeding  Syncopal episode-on admission, multifactorial in the setting of dehydration, vasovagal component given he passed out right after an episode of severe pain.  No further issues  Hypoalbuminemia -May complicate postoperative recovery, encourage p.o. intake  Scheduled Meds:  docusate sodium  100 mg Oral BID   dorzolamide -timolol   1 drop Both Eyes BID   feeding supplement  237 mL Oral BID BM   heparin   5,000 Units Subcutaneous Q8H   latanoprost   1 drop Both Eyes QHS   melatonin  3 mg Oral QHS   Continuous Infusions:  sodium chloride      piperacillin-tazobactam (ZOSYN)  IV 3.375 g (05/21/24 0550)   PRN Meds:.acetaminophen , polyethylene glycol  Current Outpatient Medications  Medication Instructions   ascorbic acid (VITAMIN C) 500 mg, Daily   aspirin EC 81 mg, Daily   benzonatate  (TESSALON ) 200 mg, 2 times daily PRN   bimatoprost (LUMIGAN) 0.03 % ophthalmic solution 1 drop, Every evening   dorzolamide -timolol  (COSOPT ) 2-0.5 % ophthalmic solution 1 drop, 2 times daily   losartan-hydrochlorothiazide (HYZAAR) 100-12.5 MG tablet 1 tablet, Daily   Multiple Vitamin (MULTIVITAMIN WITH MINERALS) TABS tablet 1 tablet, Daily   omeprazole  (PRILOSEC OTC) 20 mg, Daily PRN   ondansetron  (ZOFRAN ) 4 mg, Every 8 hours PRN   polyethylene glycol powder (GLYCOLAX /MIRALAX ) 17 g, Oral, Daily PRN, Mix in 4-8 ounces of liquid as directed.   Red Yeast Rice Extract (RED YEAST RICE PO) 1 capsule, Weekly  Diet Orders (From admission, onward)     Start     Ordered   05/21/24 0747  Diet regular Room service appropriate? Yes; Fluid consistency: Thin  Diet effective now       Question Answer Comment  Room service appropriate? Yes   Fluid consistency: Thin       05/21/24 0746            DVT prophylaxis: heparin  injection 5,000 Units Start: 05/16/24 2200   Lab Results  Component Value Date   PLT 420 (H) 05/21/2024      Code Status: Full Code  Family Communication: Wife at bedside  Status is: Inpatient Remains inpatient appropriate because: Severity of illness   Level of care: Telemetry  Consultants:  Gastroenterology General Surgery ID  Objective: Vitals:   05/20/24 1648 05/20/24 1929 05/21/24 0422 05/21/24 0836  BP: 95/68 102/69 97/67 116/76  Pulse: (!) 59 (!) 54 (!) 54 69  Resp: 18 18 18 18   Temp: 97.9 F (36.6 C) (!) 97.4 F (36.3 C) (!) 97.4 F (36.3 C) 98 F (36.7 C)  TempSrc:      SpO2: 98% 100% 98% 97%  Weight:      Height:        Intake/Output Summary (Last 24 hours) at 05/21/2024 1007 Last data filed at 05/21/2024 0900 Gross per 24 hour  Intake 1878.17 ml  Output 700 ml  Net 1178.17 ml   Wt Readings from Last 3 Encounters:  05/17/24 76.2 kg  04/20/24 80.9 kg  04/03/24 84.8 kg    Examination:  Constitutional: NAD Eyes: lids and conjunctivae normal, no scleral icterus ENMT: mmm Neck: normal, supple Respiratory: clear to auscultation bilaterally, no wheezing, no crackles. Normal respiratory effort.  Cardiovascular: Regular rate and rhythm, no murmurs / rubs / gallops. No LE edema. Abdomen: soft, no distention, no tenderness. Bowel sounds positive.   Data Reviewed: I have independently reviewed following labs and imaging studies   CBC Recent Labs  Lab 05/16/24 1645 05/16/24 1852 05/18/24 0020 05/18/24 0243 05/19/24 0459 05/20/24 0327 05/21/24 0455  WBC 15.1*   < > 17.8* 19.1* 17.0* 14.1* 9.9  HGB 10.7*   < > 9.3* 9.0* 9.2* 9.2* 9.7*  HCT 31.0*   < > 26.6* 25.6* 25.7* 26.4* 27.9*  PLT 495*   < > 441* 424* 414* 446* 420*  MCV 81.8   < > 79.9* 80.3 79.3* 79.8* 80.6  MCH 28.2   < > 27.9 28.2 28.4 27.8 28.0  MCHC 34.5   < > 35.0 35.2 35.8 34.8 34.8  RDW 15.2   < > 15.0 14.9 15.3  16.0* 16.4*  LYMPHSABS 1.2  --   --   --   --   --   --   MONOABS 1.1*  --   --   --   --   --   --   EOSABS 0.0  --   --   --   --   --   --   BASOSABS 0.0  --   --   --   --   --   --    < > = values in this interval not displayed.    Recent Labs  Lab 05/16/24 1645 05/16/24 1852 05/16/24 2357 05/17/24 0342 05/17/24 0758 05/18/24 0020 05/18/24 0243 05/19/24 0459 05/20/24 0327 05/21/24 0455  NA 135 140  --    < >  --  139 141 145 147* 144  K 2.8* 2.6*  --    < >  --  3.3* 3.3* 3.1* 3.9 3.3*  CL 97* 101  --    < >  --  105 106 110 113* 111  CO2 23  --   --    < >  --  23 22 23  21* 22  GLUCOSE 148* 113*  --    < >  --  124* 150* 95 100* 80  BUN 64* 48*  --    < >  --  47* 43* 34* 30* 30*  CREATININE 3.70* 3.30* 3.19*   < >  --  2.70* 2.61* 2.28* 1.95* 1.82*  CALCIUM 8.4*  --   --    < >  --  7.9* 7.9* 8.3* 8.3* 8.1*  AST 68*  --   --    < >  --  64* 55* 57* 60* 60*  ALT 59*  --   --    < >  --  54* 50* 50* 45* 45*  ALKPHOS 312*  --   --    < >  --  299* 276* 255* 233* 217*  BILITOT 19.6*  --   --    < >  --  18.2* 17.1* 14.8* 13.5* 11.4*  ALBUMIN 1.9*  --   --    < >  --  1.6* <1.5* <1.5* <1.5* <1.5*  MG  --   --  1.9  --   --   --   --  1.7  --   --   CRP  --   --   --   --  17.8*  --   --   --   --   --   PROCALCITON  --   --   --   --  1.88  --   --   --   --   --   LATICACIDVEN  --  0.9  --   --  0.8  --   --   --   --   --   INR  --   --   --   --   --  1.3*  --   --   --   --   BNP 46.3  --   --   --   --   --   --   --   --   --    < > = values in this interval not displayed.    ------------------------------------------------------------------------------------------------------------------ No results for input(s): CHOL, HDL, LDLCALC, TRIG, CHOLHDL, LDLDIRECT in the last 72 hours.  No results found for: HGBA1C ------------------------------------------------------------------------------------------------------------------ No results for input(s):  TSH, T4TOTAL, T3FREE, THYROIDAB in the last 72 hours.  Invalid input(s): FREET3  Cardiac Enzymes No results for input(s): CKMB, TROPONINI, MYOGLOBIN in the last 168 hours.  Invalid input(s): CK ------------------------------------------------------------------------------------------------------------------    Component Value Date/Time   BNP 46.3 05/16/2024 1645    CBG: No results for input(s): GLUCAP in the last 168 hours.  Recent Results (from the past 240 hours)  Urine Culture (for pregnant, neutropenic or urologic patients or patients with an indwelling urinary catheter)     Status: Abnormal   Collection Time: 05/17/24  1:21 AM   Specimen: Urine, Clean Catch  Result Value Ref Range Status   Specimen Description URINE, CLEAN CATCH  Final   Special Requests   Final    NONE Performed at [facility], [address] [place], [place] [address]    Culture >=100,000 COLONIES/mL ENTEROCOCCUS FAECALIS (A)  Final   Report  Status 05/19/2024 FINAL  Final   Organism ID, Bacteria ENTEROCOCCUS FAECALIS (A)  Final      Susceptibility   Enterococcus faecalis - MIC*    AMPICILLIN  <=2 SENSITIVE Sensitive     NITROFURANTOIN <=16 SENSITIVE Sensitive     VANCOMYCIN 1 SENSITIVE Sensitive     * >=100,000 COLONIES/mL ENTEROCOCCUS FAECALIS  Culture, blood (Routine X 2) w Reflex to ID Panel     Status: Abnormal   Collection Time: 05/17/24  9:33 PM   Specimen: BLOOD  Result Value Ref Range Status   Specimen Description BLOOD SITE NOT SPECIFIED  Final   Special Requests   Final    BOTTLES DRAWN AEROBIC AND ANAEROBIC Blood Culture adequate volume   Culture  Setup Time   Final    GRAM NEGATIVE RODS AEROBIC BOTTLE ONLY RBV [place] PHARMD 05/19/2024 @0647  BY DD Performed at [facility], [address], [place], [place] [address]    Culture PSEUDOMONAS AERUGINOSA (A)  Final   Report Status 05/21/2024 FINAL  Final   Organism ID, Bacteria PSEUDOMONAS AERUGINOSA   Final      Susceptibility   Pseudomonas aeruginosa - MIC*    MEROPENEM 1 SENSITIVE Sensitive     CIPROFLOXACIN  0.5 SENSITIVE Sensitive     IMIPENEM 2 SENSITIVE Sensitive     PIP/TAZO Value in next row Sensitive      8 SENSITIVEThis is a modified FDA-approved test that has been validated and its performance characteristics determined by the reporting laboratory.  This laboratory is certified under the Clinical Laboratory Improvement Amendments CLIA as qualified to perform high complexity clinical laboratory testing.    CEFEPIME Value in next row Sensitive      8 SENSITIVEThis is a modified FDA-approved test that has been validated and its performance characteristics determined by the reporting laboratory.  This laboratory is certified under the Clinical Laboratory Improvement Amendments CLIA as qualified to perform high complexity clinical laboratory testing.    CEFTAZIDIME/AVIBACTAM Value in next row Sensitive      8 SENSITIVEThis is a modified FDA-approved test that has been validated and its performance characteristics determined by the reporting laboratory.  This laboratory is certified under the Clinical Laboratory Improvement Amendments CLIA as qualified to perform high complexity clinical laboratory testing.    CEFTOLOZANE/TAZOBACTAM Value in next row Sensitive      8 SENSITIVEThis is a modified FDA-approved test that has been validated and its performance characteristics determined by the reporting laboratory.  This laboratory is certified under the Clinical Laboratory Improvement Amendments CLIA as qualified to perform high complexity clinical laboratory testing.    TOBRAMYCIN Value in next row Sensitive      8 SENSITIVEThis is a modified FDA-approved test that has been validated and its performance characteristics determined by the reporting laboratory.  This laboratory is certified under the Clinical Laboratory Improvement Amendments CLIA as qualified to perform high complexity clinical  laboratory testing.    CEFTAZIDIME Value in next row Sensitive      8 SENSITIVEThis is a modified FDA-approved test that has been validated and its performance characteristics determined by the reporting laboratory.  This laboratory is certified under the Clinical Laboratory Improvement Amendments CLIA as qualified to perform high complexity clinical laboratory testing.    * PSEUDOMONAS AERUGINOSA  Blood Culture ID Panel (Reflexed)     Status: Abnormal   Collection Time: 05/17/24  9:33 PM  Result Value Ref Range Status   Enterococcus faecalis NOT DETECTED NOT DETECTED Final   Enterococcus Faecium  NOT DETECTED NOT DETECTED Final   Listeria monocytogenes NOT DETECTED NOT DETECTED Final   Staphylococcus species NOT DETECTED NOT DETECTED Final   Staphylococcus aureus (BCID) NOT DETECTED NOT DETECTED Final   Staphylococcus epidermidis NOT DETECTED NOT DETECTED Final   Staphylococcus lugdunensis NOT DETECTED NOT DETECTED Final   Streptococcus species NOT DETECTED NOT DETECTED Final   Streptococcus agalactiae NOT DETECTED NOT DETECTED Final   Streptococcus pneumoniae NOT DETECTED NOT DETECTED Final   Streptococcus pyogenes NOT DETECTED NOT DETECTED Final   A.calcoaceticus-baumannii NOT DETECTED NOT DETECTED Final   Bacteroides fragilis NOT DETECTED NOT DETECTED Final   Enterobacterales NOT DETECTED NOT DETECTED Final   Enterobacter cloacae complex NOT DETECTED NOT DETECTED Final   Escherichia coli NOT DETECTED NOT DETECTED Final   Klebsiella aerogenes NOT DETECTED NOT DETECTED Final   Klebsiella oxytoca NOT DETECTED NOT DETECTED Final   Klebsiella pneumoniae NOT DETECTED NOT DETECTED Final   Proteus species NOT DETECTED NOT DETECTED Final   Salmonella species NOT DETECTED NOT DETECTED Final   Serratia marcescens NOT DETECTED NOT DETECTED Final   Haemophilus influenzae NOT DETECTED NOT DETECTED Final   Neisseria meningitidis NOT DETECTED NOT DETECTED Final   Pseudomonas aeruginosa DETECTED (A)  NOT DETECTED Final    Comment: CRITICAL RESULT CALLED TO, READ BACK BY AND VERIFIED WITH: RBV WYLAND PHARMD 05/19/2024 @0647  BY DD    Stenotrophomonas maltophilia NOT DETECTED NOT DETECTED Final   Candida albicans NOT DETECTED NOT DETECTED Final   Candida auris NOT DETECTED NOT DETECTED Final   Candida glabrata NOT DETECTED NOT DETECTED Final   Candida krusei NOT DETECTED NOT DETECTED Final   Candida parapsilosis NOT DETECTED NOT DETECTED Final   Candida tropicalis NOT DETECTED NOT DETECTED Final   Cryptococcus neoformans/gattii NOT DETECTED NOT DETECTED Final   CTX-M ESBL NOT DETECTED NOT DETECTED Final   Carbapenem resistance IMP NOT DETECTED NOT DETECTED Final   Carbapenem resistance KPC NOT DETECTED NOT DETECTED Final   Carbapenem resistance NDM NOT DETECTED NOT DETECTED Final   Carbapenem resistance VIM NOT DETECTED NOT DETECTED Final    Comment: Performed at [facility], [address], [place], [place] [address]  Culture, blood (Routine X 2) w Reflex to ID Panel     Status: Abnormal   Collection Time: 05/17/24  9:38 PM   Specimen: BLOOD  Result Value Ref Range Status   Specimen Description BLOOD SITE NOT SPECIFIED  Final   Special Requests   Final    BOTTLES DRAWN AEROBIC AND ANAEROBIC Blood Culture adequate volume   Culture  Setup Time   Final    GRAM NEGATIVE RODS AEROBIC BOTTLE ONLY CRITICAL VALUE NOTED.  VALUE IS CONSISTENT WITH PREVIOUSLY REPORTED AND CALLED VALUE.    Culture (A)  Final    PSEUDOMONAS AERUGINOSA SUSCEPTIBILITIES PERFORMED ON PREVIOUS CULTURE WITHIN THE LAST 5 DAYS. Performed at [facility], [address] [place], [place] [address]    Report Status 05/21/2024 FINAL  Final     Radiology Studies: DG ERCP Result Date: 05/20/2024 CLINICAL DATA:  Jaundice and history of cholangiocarcinoma with suspicion for ascending cholangitis. EXAM: ERCP 5 intra procedural fluoroscopic images and 25 cine loops TECHNIQUE: Multiple spot images obtained  with the fluoroscopic device and submitted for interpretation post-procedure. FLUOROSCOPY: Radiation Exposure Index (as provided by the fluoroscopic device): 63.77 mGy Kerma COMPARISON:  None Available. FINDINGS: Fluoroscopic images demonstrate surgical clips in the right upper quadrant consistent with previous cholecystectomy. Flexible endoscopy device with guidewire extending  into the common bile duct. Multiple 7A loops demonstrate difficulty accessing the ampulla and common bile duct. Contrast injection into the common bile duct demonstrates the proximal common bile duct and hepatic ducts to be markedly dilated. Dilatation likely from distal/ampullary occlusion. Procedural placement of metallic stent at the ampulla no contrast extravasation identified. IMPRESSION: ERCP with placement of metallic stent in the common bile duct. These images were submitted for radiologic interpretation only. Please see the procedural report for the amount of contrast and the fluoroscopy time utilized. Electronically Signed   By: Cordella Banner   On: 05/20/2024 18:47     Nilda Fendt, MD, PhD [facility]  Between 7 am - 7 pm I am available, please contact me via Amion (for emergencies) or Securechat (non urgent messages)  Between 7 pm - 7 am I am not available, please contact night coverage MD/APP via Amion

## 2024-05-21 NOTE — Progress Notes (Signed)
 [facility]  Date of Admission:  05/16/2024     Reason for Follow Up: Syncope and collapse  Total days of antibiotics 5         ASSESSMENT:  Mr. Lindaman is a 78 y/o AA male with history of cholangiocarcinoma and biliary obstruction admitted for cholangitis and secondary Pseudomonas bacteremia.  Mr. Wray is POD #1 from ERCP with stent placement. Tolerating antibiotics with no adverse side effects. Discussed recommended plan of care to transition to Ciprofloxacin  500 mg PO bid for 6 days from ERCP. No additional follow up with ID needed. Post-ERCP care per Gastroenterology. Continue standard/universal precautions. Remaining medical and supportive care per Internal Medicine.   PLAN:  Change antibiotics to Ciprofloxacin  500 mg PO bid for 6 days from ERCP with end date of 05/26/24.  Post-ERCP care per Gastroenterology. No ID follow up needed.  Remaining medical and supportive care per Internal Medicine.  ID will sign off. Please re-consult if needed.   Principal Problem:   Syncope and collapse Active Problems:   Bile duct stricture (HCC)   Cholangitis (HCC)   Cholangiocarcinoma (HCC)   Bacteremia due to Pseudomonas    ciprofloxacin   500 mg Oral BID   docusate sodium  100 mg Oral BID   dorzolamide -timolol   1 drop Both Eyes BID   feeding supplement  237 mL Oral BID BM   heparin   5,000 Units Subcutaneous Q8H   latanoprost   1 drop Both Eyes QHS   melatonin  3 mg Oral QHS    SUBJECTIVE:  Afebrile overnight with no acute events. Tolerating antibiotics with no adverse side effects.   Allergies  Allergen Reactions   Oxycontin  [Oxycodone ]     Crazy dreams     Review of Systems: Review of Systems  Constitutional:  Negative for chills, fever and weight loss.  Respiratory:  Negative for cough, shortness of breath and wheezing.   Cardiovascular:  Negative for chest pain and leg swelling.  Gastrointestinal:  Negative for abdominal pain,  constipation, diarrhea, nausea and vomiting.  Skin:  Negative for rash.      OBJECTIVE: Vitals:   05/20/24 1648 05/20/24 1929 05/21/24 0422 05/21/24 0836  BP: 95/68 102/69 97/67 116/76  Pulse: (!) 59 (!) 54 (!) 54 69  Resp: 18 18 18 18   Temp: 97.9 F (36.6 C) (!) 97.4 F (36.3 C) (!) 97.4 F (36.3 C) 98 F (36.7 C)  TempSrc:      SpO2: 98% 100% 98% 97%  Weight:      Height:       Body mass index is 22.16 kg/m.  Physical Exam Constitutional:      General: He is not in acute distress.    Appearance: He is well-developed.     Comments: Seated in the chair beside the bed eating lunch.   Cardiovascular:     Rate and Rhythm: Normal rate and regular rhythm.     Heart sounds: Normal heart sounds.  Pulmonary:     Effort: Pulmonary effort is normal.     Breath sounds: Normal breath sounds.  Skin:    General: Skin is warm and dry.  Neurological:     Mental Status: He is alert and oriented to person, place, and time.     Lab Results Lab Results  Component Value Date   WBC 9.9 05/21/2024   HGB 9.7 (L) 05/21/2024   HCT 27.9 (L) 05/21/2024   MCV 80.6 05/21/2024   PLT 420 (H) 05/21/2024  Lab Results  Component Value Date   CREATININE 1.82 (H) 05/21/2024   BUN 30 (H) 05/21/2024   NA 144 05/21/2024   K 3.3 (L) 05/21/2024   CL 111 05/21/2024   CO2 22 05/21/2024    Lab Results  Component Value Date   ALT 45 (H) 05/21/2024   AST 60 (H) 05/21/2024   ALKPHOS 217 (H) 05/21/2024   BILITOT 11.4 (H) 05/21/2024     Microbiology: Recent Results (from the past 240 hours)  Urine Culture (for pregnant, neutropenic or urologic patients or patients with an indwelling urinary catheter)     Status: Abnormal   Collection Time: 05/17/24  1:21 AM   Specimen: Urine, Clean Catch  Result Value Ref Range Status   Specimen Description URINE, CLEAN CATCH  Final   Special Requests   Final    NONE Performed at [facility], [address] [place], [place] [address]     Culture >=100,000 COLONIES/mL ENTEROCOCCUS FAECALIS (A)  Final   Report Status 05/19/2024 FINAL  Final   Organism ID, Bacteria ENTEROCOCCUS FAECALIS (A)  Final      Susceptibility   Enterococcus faecalis - MIC*    AMPICILLIN  <=2 SENSITIVE Sensitive     NITROFURANTOIN <=16 SENSITIVE Sensitive     VANCOMYCIN 1 SENSITIVE Sensitive     * >=100,000 COLONIES/mL ENTEROCOCCUS FAECALIS  Culture, blood (Routine X 2) w Reflex to ID Panel     Status: Abnormal   Collection Time: 05/17/24  9:33 PM   Specimen: BLOOD  Result Value Ref Range Status   Specimen Description BLOOD SITE NOT SPECIFIED  Final   Special Requests   Final    BOTTLES DRAWN AEROBIC AND ANAEROBIC Blood Culture adequate volume   Culture  Setup Time   Final    GRAM NEGATIVE RODS AEROBIC BOTTLE ONLY RBV WYLAND PHARMD 05/19/2024 @0647  BY DD Performed at [facility], [address] [place], [place] [address]    Culture PSEUDOMONAS AERUGINOSA (A)  Final   Report Status 05/21/2024 FINAL  Final   Organism ID, Bacteria PSEUDOMONAS AERUGINOSA  Final      Susceptibility   Pseudomonas aeruginosa - MIC*    MEROPENEM 1 SENSITIVE Sensitive     CIPROFLOXACIN  0.5 SENSITIVE Sensitive     IMIPENEM 2 SENSITIVE Sensitive     PIP/TAZO Value in next row Sensitive      8 SENSITIVEThis is a modified FDA-approved test that has been validated and its performance characteristics determined by the reporting laboratory.  This laboratory is certified under the Clinical Laboratory Improvement Amendments CLIA as qualified to perform high complexity clinical laboratory testing.    CEFEPIME Value in next row Sensitive      8 SENSITIVEThis is a modified FDA-approved test that has been validated and its performance characteristics determined by the reporting laboratory.  This laboratory is certified under the Clinical Laboratory Improvement Amendments CLIA as qualified to perform high complexity clinical laboratory testing.    CEFTAZIDIME/AVIBACTAM Value in  next row Sensitive      8 SENSITIVEThis is a modified FDA-approved test that has been validated and its performance characteristics determined by the reporting laboratory.  This laboratory is certified under the Clinical Laboratory Improvement Amendments CLIA as qualified to perform high complexity clinical laboratory testing.    CEFTOLOZANE/TAZOBACTAM Value in next row Sensitive      8 SENSITIVEThis is a modified FDA-approved test that has been validated and its performance characteristics determined by the reporting laboratory.  This laboratory  is certified under the Clinical Laboratory Improvement Amendments CLIA as qualified to perform high complexity clinical laboratory testing.    TOBRAMYCIN Value in next row Sensitive      8 SENSITIVEThis is a modified FDA-approved test that has been validated and its performance characteristics determined by the reporting laboratory.  This laboratory is certified under the Clinical Laboratory Improvement Amendments CLIA as qualified to perform high complexity clinical laboratory testing.    CEFTAZIDIME Value in next row Sensitive      8 SENSITIVEThis is a modified FDA-approved test that has been validated and its performance characteristics determined by the reporting laboratory.  This laboratory is certified under the Clinical Laboratory Improvement Amendments CLIA as qualified to perform high complexity clinical laboratory testing.    * PSEUDOMONAS AERUGINOSA  Blood Culture ID Panel (Reflexed)     Status: Abnormal   Collection Time: 05/17/24  9:33 PM  Result Value Ref Range Status   Enterococcus faecalis NOT DETECTED NOT DETECTED Final   Enterococcus Faecium NOT DETECTED NOT DETECTED Final   Listeria monocytogenes NOT DETECTED NOT DETECTED Final   Staphylococcus species NOT DETECTED NOT DETECTED Final   Staphylococcus aureus (BCID) NOT DETECTED NOT DETECTED Final   Staphylococcus epidermidis NOT DETECTED NOT DETECTED Final   Staphylococcus lugdunensis NOT  DETECTED NOT DETECTED Final   Streptococcus species NOT DETECTED NOT DETECTED Final   Streptococcus agalactiae NOT DETECTED NOT DETECTED Final   Streptococcus pneumoniae NOT DETECTED NOT DETECTED Final   Streptococcus pyogenes NOT DETECTED NOT DETECTED Final   A.calcoaceticus-baumannii NOT DETECTED NOT DETECTED Final   Bacteroides fragilis NOT DETECTED NOT DETECTED Final   Enterobacterales NOT DETECTED NOT DETECTED Final   Enterobacter cloacae complex NOT DETECTED NOT DETECTED Final   Escherichia coli NOT DETECTED NOT DETECTED Final   Klebsiella aerogenes NOT DETECTED NOT DETECTED Final   Klebsiella oxytoca NOT DETECTED NOT DETECTED Final   Klebsiella pneumoniae NOT DETECTED NOT DETECTED Final   Proteus species NOT DETECTED NOT DETECTED Final   Salmonella species NOT DETECTED NOT DETECTED Final   Serratia marcescens NOT DETECTED NOT DETECTED Final   Haemophilus influenzae NOT DETECTED NOT DETECTED Final   Neisseria meningitidis NOT DETECTED NOT DETECTED Final   Pseudomonas aeruginosa DETECTED (A) NOT DETECTED Final    Comment: CRITICAL RESULT CALLED TO, READ BACK BY AND VERIFIED WITH: RBV WYLAND PHARMD 05/19/2024 @0647  BY DD    Stenotrophomonas maltophilia NOT DETECTED NOT DETECTED Final   Candida albicans NOT DETECTED NOT DETECTED Final   Candida auris NOT DETECTED NOT DETECTED Final   Candida glabrata NOT DETECTED NOT DETECTED Final   Candida krusei NOT DETECTED NOT DETECTED Final   Candida parapsilosis NOT DETECTED NOT DETECTED Final   Candida tropicalis NOT DETECTED NOT DETECTED Final   Cryptococcus neoformans/gattii NOT DETECTED NOT DETECTED Final   CTX-M ESBL NOT DETECTED NOT DETECTED Final   Carbapenem resistance IMP NOT DETECTED NOT DETECTED Final   Carbapenem resistance KPC NOT DETECTED NOT DETECTED Final   Carbapenem resistance NDM NOT DETECTED NOT DETECTED Final   Carbapenem resistance VIM NOT DETECTED NOT DETECTED Final    Comment: Performed at [facility],  [address], [place], [place] [address]  Culture, blood (Routine X 2) w Reflex to ID Panel     Status: Abnormal   Collection Time: 05/17/24  9:38 PM   Specimen: BLOOD  Result Value Ref Range Status   Specimen Description BLOOD SITE NOT SPECIFIED  Final   Special Requests   Final  BOTTLES DRAWN AEROBIC AND ANAEROBIC Blood Culture adequate volume   Culture  Setup Time   Final    GRAM NEGATIVE RODS AEROBIC BOTTLE ONLY CRITICAL VALUE NOTED.  VALUE IS CONSISTENT WITH PREVIOUSLY REPORTED AND CALLED VALUE.    Culture (A)  Final    PSEUDOMONAS AERUGINOSA SUSCEPTIBILITIES PERFORMED ON PREVIOUS CULTURE WITHIN THE LAST 5 DAYS. Performed at [facility], [address] [place], [place] [address]    Report Status 05/21/2024 FINAL  Final     Cathlyn July, NP [facility]  05/21/2024  4:26 PM

## 2024-05-21 NOTE — Progress Notes (Signed)
 1 Day Post-Op   Subjective/Chief Complaint: ERCP performed yesterday. Pt feels very weak.     Objective: Vital signs in last 24 hours: Temp:  [97.4 F (36.3 C)-98 F (36.7 C)] 98 F (36.7 C) (11/10 0836) Pulse Rate:  [54-69] 69 (11/10 0836) Resp:  [18] 18 (11/10 0836) BP: (95-116)/(67-76) 116/76 (11/10 0836) SpO2:  [97 %-100 %] 97 % (11/10 0836) Last BM Date : 05/21/24  Intake/Output from previous day: 11/09 0701 - 11/10 0700 In: 1518.2 [P.O.:200; I.V.:1137.8; IV Piggyback:180.3] Out: 700 [Urine:700] Intake/Output this shift: Total I/O In: 360 [P.O.:360] Out: -   Gen: alert and oriented.  CV regular Resp:  breathing comfortably Abd: soft, non tender, non distended  Lab Results:  Recent Labs    05/20/24 0327 05/21/24 0455  WBC 14.1* 9.9  HGB 9.2* 9.7*  HCT 26.4* 27.9*  PLT 446* 420*   BMET Recent Labs    05/20/24 0327 05/21/24 0455  NA 147* 144  K 3.9 3.3*  CL 113* 111  CO2 21* 22  GLUCOSE 100* 80  BUN 30* 30*  CREATININE 1.95* 1.82*  CALCIUM 8.3* 8.1*   PT/INR No results for input(s): LABPROT, INR in the last 72 hours.  ABG No results for input(s): PHART, HCO3 in the last 72 hours.  Invalid input(s): PCO2, PO2  Studies/Results: DG ERCP Result Date: 05/20/2024 CLINICAL DATA:  Jaundice and history of cholangiocarcinoma with suspicion for ascending cholangitis. EXAM: ERCP 5 intra procedural fluoroscopic images and 25 cine loops TECHNIQUE: Multiple spot images obtained with the fluoroscopic device and submitted for interpretation post-procedure. FLUOROSCOPY: Radiation Exposure Index (as provided by the fluoroscopic device): 63.77 mGy Kerma COMPARISON:  None Available. FINDINGS: Fluoroscopic images demonstrate surgical clips in the right upper quadrant consistent with previous cholecystectomy. Flexible endoscopy device with guidewire extending into the common bile duct. Multiple 7A loops demonstrate difficulty accessing the ampulla and common  bile duct. Contrast injection into the common bile duct demonstrates the proximal common bile duct and hepatic ducts to be markedly dilated. Dilatation likely from distal/ampullary occlusion. Procedural placement of metallic stent at the ampulla no contrast extravasation identified. IMPRESSION: ERCP with placement of metallic stent in the common bile duct. These images were submitted for radiologic interpretation only. Please see the procedural report for the amount of contrast and the fluoroscopy time utilized. Electronically Signed   By: Cordella Banner   On: 05/20/2024 18:47    Anti-infectives: Anti-infectives (From admission, onward)    Start     Dose/Rate Route Frequency Ordered Stop   05/21/24 1330  ciprofloxacin  (CIPRO ) tablet 500 mg        500 mg Oral 2 times daily 05/21/24 1244 05/28/24 0759   05/18/24 0600  piperacillin-tazobactam (ZOSYN) IVPB 3.375 g  Status:  Discontinued        3.375 g 12.5 mL/hr over 240 Minutes Intravenous Every 8 hours 05/17/24 2232 05/21/24 1244   05/17/24 2330  piperacillin-tazobactam (ZOSYN) IVPB 3.375 g        3.375 g 100 mL/hr over 30 Minutes Intravenous  Once 05/17/24 2231 05/18/24 0147       Assessment/Plan: Distal cholangiocarcinoma Malignant biliary obstruction jaundice Severe protein calorie malnutrition Acute kidney injury Anemia of chronic disease, ? Chronic blood loss anemia as well.    T bili and creatinine continuing to improve.  However prealbumin and albumin remain low and patient is very weak from this admission. Will delay surgery for his strength and weight to improve.       LOS: 5  days    Jina LITTIE Nephew, MD, FACS, FSSO Surgical Oncology, General Surgery, Trauma and [facility], [place] [phone] for weekday/non holidays Check [email or web address] for coverage night/weekend/holidays under General Surgery

## 2024-05-22 ENCOUNTER — Other Ambulatory Visit (HOSPITAL_COMMUNITY): Payer: Self-pay

## 2024-05-22 ENCOUNTER — Other Ambulatory Visit (HOSPITAL_COMMUNITY)

## 2024-05-22 DIAGNOSIS — R55 Syncope and collapse: Secondary | ICD-10-CM | POA: Diagnosis not present

## 2024-05-22 LAB — CBC
HCT: 27 % — ABNORMAL LOW (ref 39.0–52.0)
Hemoglobin: 9.2 g/dL — ABNORMAL LOW (ref 13.0–17.0)
MCH: 27.7 pg (ref 26.0–34.0)
MCHC: 34.1 g/dL (ref 30.0–36.0)
MCV: 81.3 fL (ref 80.0–100.0)
Platelets: 449 K/uL — ABNORMAL HIGH (ref 150–400)
RBC: 3.32 MIL/uL — ABNORMAL LOW (ref 4.22–5.81)
RDW: 16.5 % — ABNORMAL HIGH (ref 11.5–15.5)
WBC: 11.8 K/uL — ABNORMAL HIGH (ref 4.0–10.5)
nRBC: 0 % (ref 0.0–0.2)

## 2024-05-22 LAB — COMPREHENSIVE METABOLIC PANEL WITH GFR
ALT: 46 U/L — ABNORMAL HIGH (ref 0–44)
AST: 58 U/L — ABNORMAL HIGH (ref 15–41)
Albumin: <1.5 g/dL — ABNORMAL LOW (ref 3.5–5.0)
Alkaline Phosphatase: 220 U/L — ABNORMAL HIGH (ref 38–126)
Anion gap: 10 (ref 5–15)
BUN: 24 mg/dL — ABNORMAL HIGH (ref 8–23)
CO2: 23 mmol/L (ref 22–32)
Calcium: 8.1 mg/dL — ABNORMAL LOW (ref 8.9–10.3)
Chloride: 113 mmol/L — ABNORMAL HIGH (ref 98–111)
Creatinine, Ser: 1.57 mg/dL — ABNORMAL HIGH (ref 0.61–1.24)
GFR, Estimated: 45 mL/min — ABNORMAL LOW (ref >60)
Glucose, Bld: 112 mg/dL — ABNORMAL HIGH (ref 70–99)
Potassium: 3.8 mmol/L (ref 3.5–5.1)
Sodium: 146 mmol/L — ABNORMAL HIGH (ref 135–145)
Total Bilirubin: 9.3 mg/dL — ABNORMAL HIGH (ref 0.0–1.2)
Total Protein: 5.7 g/dL — ABNORMAL LOW (ref 6.5–8.1)

## 2024-05-22 LAB — MAGNESIUM: Magnesium: 1.4 mg/dL — ABNORMAL LOW (ref 1.7–2.4)

## 2024-05-22 MED ORDER — CIPROFLOXACIN HCL 500 MG PO TABS
500.0000 mg | ORAL_TABLET | Freq: Two times a day (BID) | ORAL | 0 refills | Status: AC
  Filled 2024-05-22: qty 11, 6d supply, fill #0

## 2024-05-22 NOTE — TOC Transition Note (Signed)
 Transition of Care [facility]) - Discharge Note   Patient Details  Name: Andrew Douglas MRN: [id number] Date of Birth: [birth year]  [facility] CM/SW Contact:  Tom-Johnson, Harvest Muskrat, RN Phone Number: 05/22/2024, 9:35 AM   Clinical Narrative:     Patient is scheduled for discharge today.  Readmission Risk Assessment done. Outpatient PT referral, hospital f/u and discharge instructions on AVS. Prescriptions sent to [facility] pharmacy and patient will receive meds prior discharge. Wife, Hortense at bedside and will transfer at discharge.  No further ICM needs noted.      Final next level of care: OP Rehab Barriers to Discharge: Barriers Resolved   Patient Goals and CMS Choice Patient states their goals for this hospitalization and ongoing recovery are:: Use walking pad/Play with grandchildren CMS Medicare.gov Compare Post Acute Care list provided to:: Patient Choice offered to / list presented to : Patient, Spouse      Discharge Placement                Patient to be transferred to facility by: Wife Name of family member notified: Hortense    Discharge Plan and Services Additional resources added to the After Visit Summary for   In-house Referral: Clinical Social Work              DME Arranged: N/A DME Agency: NA       HH Arranged: NA HH Agency: NA        Social Drivers of Health (SDOH) Interventions SDOH Screenings   Food Insecurity: No Food Insecurity (05/17/2024)  Housing: Low Risk  (05/17/2024)  Transportation Needs: No Transportation Needs (05/17/2024)  Utilities: Not At Risk (05/17/2024)  Depression (PHQ2-9): Low Risk  (04/20/2024)  Financial Resource Strain: Low Risk  (04/19/2022)   Received from [facility]  Social Connections: Unknown (05/17/2024)  Tobacco Use: Medium Risk (05/20/2024)     Readmission Risk Interventions    05/22/2024    9:34 AM 10/03/2023   10:35 AM  Readmission Risk Prevention Plan  Post Dischage Appt   Complete  Medication Screening  Complete  Transportation Screening Complete Complete  PCP or Specialist Appt within 3-5 Days Complete   HRI or Home Care Consult Complete   Social Work Consult for Recovery Care Planning/Counseling Complete   Palliative Care Screening Not Applicable   Medication Review Oceanographer) Referral to Pharmacy

## 2024-05-22 NOTE — Progress Notes (Signed)
 Brief Nutrition Note  Received consult for assessment as pt has cholangiocarcinoma and will need upcoming Whipple surgery. Pt's appetite has been poor and needs to optimize nutrition status prior to surgery. Provided education on High Calorie, High Protein Nutrition Therapy and High Calorie, High Protein Snack Ideas handouts from the Academy of [employer] and Dietetics. Discussed ways to add calories and protein to all meals and snacks. Encouraged use of butter in cooking, adding creams/yogurts to soups, and eating small, frequent meals. Provided Ensure coupons and encouraged drinking 1-2 per day to help with calorie and protein intake.   Unable to complete full assessment as pt was actively being discharged but suspect malnutrition related to cancer given previous diagnosis of severe protein calorie malnutrition. Would recommend pt follow up with outpatient oncology RD for further nutrition support.     Josette Glance, MS, RDN, LDN Clinical Dietitian I Please reach out via secure chat

## 2024-05-22 NOTE — Discharge Summary (Signed)
 Physician Discharge Summary  Andrew Douglas FMW:[id number] DOB: 1945/08/22 DOA: 05/16/2024  PCP: [name], PA  Admit date: 05/16/2024 Discharge date: 05/22/2024  Admitted From: home Disposition:  home  Recommendations for Outpatient Follow-up:  Follow up with PCP in 1-2 weeks Follow-up with general surgery in preparation for upcoming Whipple  Home Health: none Equipment/Devices: none  Discharge Condition: stable CODE STATUS: Full code Diet Orders (From admission, onward)     Start     Ordered   05/21/24 0747  Diet regular Room service appropriate? Yes; Fluid consistency: Thin  Diet effective now       Question Answer Comment  Room service appropriate? 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  Patient reports about 4 to 5 days of weakness prior to that, he was at a nail salon getting a pedicure, experienced excruciating pain with an ingrown toenail and he passed out.  He was brought to the hospital for syncope, but was found to have significant elevation in the white count as well as significant LFT elevation.  General surgery, GI, ID consulted  Hospital Course / Discharge diagnoses: Principal problem Cholangiocarcinoma, prior biliary stent placement and removal, history of cholecystectomy, elevated LFTs-most recent stent was removed September 2025 by Dr. [name].  He has a planned Whipple procedure on 11/13 with Dr. [name].  Imaging during this admission showed increase severe intra and extrahepatic bile duct dilatation with tapering at the level of the ampulla.  Status post successful ERCP 11/9 with stent placement.  He recovered well following the procedure, bilirubin is coming down, he is able  to tolerate a regular diet without nausea or vomiting.  He was initially was supposed to get his Whipple procedure in 11/13, however this has been postponed by general surgery, he will have follow-up as an outpatient.   Active problems Gram-negative bacteremia, cholangitis -blood cultures 11/8 with Pseudomonas, likely from biliary source.  ID consulted and followed patient while hospitalized.  While here he was maintained on IV antibiotics, and based on sensitivities he will be sent home with ciprofloxacin  for 6 additional days as per ID recommendations.  He is afebrile  Acute kidney injury -felt to be related to hypoperfusion/hypotension on admission from several days of decreased p.o. intake and weakness.  Creatinine improving with fluid, continue to hold losartan/HCTZ upon discharge home Essential hypertension-due to low normal BPs and acute kidney injury, continue to hold home Hyzaar Hypokalemia-potassium replenished  Anemia of chronic disease, underlying malignancy-hemoglobin stable, no bleeding Syncopal episode-on admission, multifactorial in the setting of dehydration, vasovagal component given he passed out right after an episode of severe pain.  No further issues Hypoalbuminemia -May complicate postoperative recovery, encourage p.o. intake  Sepsis ruled out   Discharge Instructions   Allergies as of 05/22/2024       Reactions   Oxycontin  [oxycodone ]    Crazy dreams        Medication List     STOP taking these medications    losartan-hydrochlorothiazide 100-12.5 MG tablet Commonly known as: HYZAAR       TAKE these medications    ascorbic acid 500 MG tablet Commonly known as: VITAMIN C Take 500 mg by mouth daily.  aspirin EC 81 MG tablet Take 81 mg by mouth daily. Swallow whole.   benzonatate  200 MG capsule Commonly known as: TESSALON  Take 200 mg by mouth 2 (two) times daily as needed for cough.   bimatoprost 0.03 % ophthalmic solution Commonly known as:  LUMIGAN Place 1 drop into both eyes every evening.   ciprofloxacin  500 MG tablet Commonly known as: CIPRO  Take 1 tablet (500 mg total) by mouth 2 (two) times daily for 11 doses.   dorzolamide -timolol  2-0.5 % ophthalmic solution Commonly known as: COSOPT  Place 1 drop into both eyes 2 (two) times daily.   multivitamin with minerals Tabs tablet Take 1 tablet by mouth daily.   omeprazole  20 MG tablet Commonly known as: PRILOSEC OTC Take 20 mg by mouth daily as needed (for acid reflux).   ondansetron  4 MG tablet Commonly known as: ZOFRAN  Take 4 mg by mouth every 8 (eight) hours as needed for nausea or vomiting.   polyethylene glycol powder 17 GM/SCOOP powder Commonly known as: GLYCOLAX /MIRALAX  Take 17 g by mouth daily as needed for mild constipation or moderate constipation. Mix in 4-8 ounces of liquid as directed.   RED YEAST RICE PO Take 1 capsule by mouth once a week.        Follow-up Information     Turmel, Caleb P, PA Follow up in 1 week(s).   Specialty: Physician Assistant Contact information: [address] [place] [address] [phone]                Consultations: GI  Procedures/Studies:  DG ERCP Result Date: 05/20/2024 CLINICAL DATA:  Jaundice and history of cholangiocarcinoma with suspicion for ascending cholangitis. EXAM: ERCP 5 intra procedural fluoroscopic images and 25 cine loops TECHNIQUE: Multiple spot images obtained with the fluoroscopic device and submitted for interpretation post-procedure. FLUOROSCOPY: Radiation Exposure Index (as provided by the fluoroscopic device): 63.77 mGy Kerma COMPARISON:  None Available. FINDINGS: Fluoroscopic images demonstrate surgical clips in the right upper quadrant consistent with previous cholecystectomy. Flexible endoscopy device with guidewire extending into the common bile duct. Multiple 7A loops demonstrate difficulty accessing the ampulla and common bile duct. Contrast injection into the  common bile duct demonstrates the proximal common bile duct and hepatic ducts to be markedly dilated. Dilatation likely from distal/ampullary occlusion. Procedural placement of metallic stent at the ampulla no contrast extravasation identified. IMPRESSION: ERCP with placement of metallic stent in the common bile duct. These images were submitted for radiologic interpretation only. Please see the procedural report for the amount of contrast and the fluoroscopy time utilized. Electronically Signed   By: Cordella Banner   On: 05/20/2024 18:47   MR ABDOMEN MRCP W WO CONTAST Result Date: 05/18/2024 CLINICAL DATA:  History of cholangiocarcinoma with jaundice EXAM: MRI ABDOMEN WITHOUT AND WITH CONTRAST (INCLUDING MRCP) TECHNIQUE: Multiplanar multisequence MR imaging of the abdomen was performed both before and after the administration of intravenous contrast. Heavily T2-weighted images of the biliary and pancreatic ducts were obtained. Post-processing was applied at the acquisition scanner with concurrent physician supervision which includes 3D reconstructions, MIPs, volume rendered images and/or shaded surface rendering. CONTRAST:  7mL GADAVIST  GADOBUTROL  1 MMOL/ML IV SOLN COMPARISON:  CT abdomen and pelvis dated 05/16/2024, MRI abdomen dated 04/12/2024 FINDINGS: Lower chest: Trace left pleural effusion. Hepatobiliary: Scattered simple-appearing cysts. Previously noted segment 5 arterially enhancing focus is not seen. Severe intra and extrahepatic bile duct dilation, increased compared to 04/12/2024 with the common bile duct measuring up to 1.7 cm, previously 1.1 cm.  Again, there is tapering at the level of ampulla, slightly more abrupt compared to 04/12/2024. Cholecystectomy. Pancreas: Similar to slightly increased diffuse main pancreatic ductal dilation measuring up to 5 mm throughout, previously 4 mm in the body/tail. No focal pancreatic lesion or inflammatory changes. Spleen:  Within normal limits in size and  appearance. Adrenals/Urinary Tract: No adrenal nodules. No suspicious renal masses identified. No evidence of hydronephrosis. Unchanged simple exophytic upper pole renal cyst. Stomach/Bowel: Colonic diverticulosis without acute diverticulitis. Vascular/Lymphatic: No pathologically enlarged lymph nodes identified. No abdominal aortic aneurysm demonstrated. Other:  None. Musculoskeletal: No suspicious bone lesions identified. IMPRESSION: 1. Increased severe intra and extrahepatic bile duct dilation with tapering at the level of ampulla, slightly more abrupt compared to 04/12/2024, at the site of known cholangiocarcinoma. 2. Similar to slightly increased diffuse main pancreatic ductal dilation. 3. Trace left pleural effusion. Electronically Signed   By: Limin  Xu M.D.   On: 05/18/2024 15:03   MR 3D Recon At Scanner Result Date: 05/18/2024 CLINICAL DATA:  History of cholangiocarcinoma with jaundice EXAM: MRI ABDOMEN WITHOUT AND WITH CONTRAST (INCLUDING MRCP) TECHNIQUE: Multiplanar multisequence MR imaging of the abdomen was performed both before and after the administration of intravenous contrast. Heavily T2-weighted images of the biliary and pancreatic ducts were obtained. Post-processing was applied at the acquisition scanner with concurrent physician supervision which includes 3D reconstructions, MIPs, volume rendered images and/or shaded surface rendering. CONTRAST:  7mL GADAVIST  GADOBUTROL  1 MMOL/ML IV SOLN COMPARISON:  CT abdomen and pelvis dated 05/16/2024, MRI abdomen dated 04/12/2024 FINDINGS: Lower chest: Trace left pleural effusion. Hepatobiliary: Scattered simple-appearing cysts. Previously noted segment 5 arterially enhancing focus is not seen. Severe intra and extrahepatic bile duct dilation, increased compared to 04/12/2024 with the common bile duct measuring up to 1.7 cm, previously 1.1 cm. Again, there is tapering at the level of ampulla, slightly more abrupt compared to 04/12/2024. Cholecystectomy.  Pancreas: Similar to slightly increased diffuse main pancreatic ductal dilation measuring up to 5 mm throughout, previously 4 mm in the body/tail. No focal pancreatic lesion or inflammatory changes. Spleen:  Within normal limits in size and appearance. Adrenals/Urinary Tract: No adrenal nodules. No suspicious renal masses identified. No evidence of hydronephrosis. Unchanged simple exophytic upper pole renal cyst. Stomach/Bowel: Colonic diverticulosis without acute diverticulitis. Vascular/Lymphatic: No pathologically enlarged lymph nodes identified. No abdominal aortic aneurysm demonstrated. Other:  None. Musculoskeletal: No suspicious bone lesions identified. IMPRESSION: 1. Increased severe intra and extrahepatic bile duct dilation with tapering at the level of ampulla, slightly more abrupt compared to 04/12/2024, at the site of known cholangiocarcinoma. 2. Similar to slightly increased diffuse main pancreatic ductal dilation. 3. Trace left pleural effusion. Electronically Signed   By: Limin  Xu M.D.   On: 05/18/2024 15:03   ECHOCARDIOGRAM COMPLETE Result Date: 05/17/2024    ECHOCARDIOGRAM REPORT   Patient Name:   AH BOTT Date of Exam: 05/17/2024 Medical Rec #:  [id number]        Height:       73.0 in Accession #:    [id number]       Weight:       168.0 lb Date of Birth:  [birth year]        BSA:          1.998 m Patient Age:    78 years         BP:           93/64 mmHg Patient Gender: M  HR:           85 bpm. Exam Location:  Inpatient Procedure: 2D Echo, Cardiac Doppler and Color Doppler (Both Spectral and Color            Flow Doppler were utilized during procedure). Indications:    Syncope  History:        Patient has no prior history of Echocardiogram examinations.                 Signs/Symptoms:Syncope and Hypotension; Risk Factors:Former                 Smoker and Hypertension.  Sonographer:    Juliene Rucks Referring Phys: [id number] SARA-MAIZ A THOMAS IMPRESSIONS  1. Left ventricular  ejection fraction, by estimation, is 60 to 65%. The left ventricle has normal function. The left ventricle has no regional wall motion abnormalities. There is mild concentric left ventricular hypertrophy. Left ventricular diastolic parameters are consistent with Grade I diastolic dysfunction (impaired relaxation).  2. Right ventricular systolic function is normal. The right ventricular size is normal.  3. The mitral valve is normal in structure. Trivial mitral valve regurgitation. No evidence of mitral stenosis.  4. The aortic valve is normal in structure. Aortic valve regurgitation is not visualized. No aortic stenosis is present.  5. The inferior vena cava is normal in size with greater than 50% respiratory variability, suggesting right atrial pressure of 3 mmHg. FINDINGS  Left Ventricle: Left ventricular ejection fraction, by estimation, is 60 to 65%. The left ventricle has normal function. The left ventricle has no regional wall motion abnormalities. The left ventricular internal cavity size was normal in size. There is  mild concentric left ventricular hypertrophy. Left ventricular diastolic parameters are consistent with Grade I diastolic dysfunction (impaired relaxation). Right Ventricle: The right ventricular size is normal. No increase in right ventricular wall thickness. Right ventricular systolic function is normal. Left Atrium: Left atrial size was normal in size. Right Atrium: Right atrial size was normal in size. Pericardium: There is no evidence of pericardial effusion. Mitral Valve: The mitral valve is normal in structure. Trivial mitral valve regurgitation. No evidence of mitral valve stenosis. Tricuspid Valve: The tricuspid valve is normal in structure. Tricuspid valve regurgitation is trivial. No evidence of tricuspid stenosis. Aortic Valve: The aortic valve is normal in structure. Aortic valve regurgitation is not visualized. No aortic stenosis is present. Pulmonic Valve: The pulmonic valve was  normal in structure. Pulmonic valve regurgitation is trivial. No evidence of pulmonic stenosis. Aorta: The aortic root is normal in size and structure. Venous: The inferior vena cava is normal in size with greater than 50% respiratory variability, suggesting right atrial pressure of 3 mmHg. IAS/Shunts: No atrial level shunt detected by color flow Doppler.  LEFT VENTRICLE PLAX 2D LVIDd:         4.40 cm   Diastology LVIDs:         3.00 cm   LV e' medial:    8.24 cm/s LV PW:         1.10 cm   LV E/e' medial:  9.8 LV IVS:        1.00 cm   LV e' lateral:   6.84 cm/s LVOT diam:     2.10 cm   LV E/e' lateral: 11.8 LV SV:         68 LV SV Index:   34 LVOT Area:     3.46 cm  RIGHT VENTRICLE  IVC RV Basal diam:  3.60 cm     IVC diam: 1.90 cm RV Mid diam:    3.40 cm RV S prime:     19.70 cm/s TAPSE (M-mode): 1.9 cm LEFT ATRIUM             Index        RIGHT ATRIUM           Index LA diam:        3.00 cm 1.50 cm/m   RA Area:     14.30 cm LA Vol (A2C):   64.7 ml 32.38 ml/m  RA Volume:   31.00 ml  15.52 ml/m LA Vol (A4C):   37.2 ml 18.62 ml/m LA Biplane Vol: 49.5 ml 24.78 ml/m  AORTIC VALVE LVOT Vmax:   100.00 cm/s LVOT Vmean:  68.100 cm/s LVOT VTI:    0.195 m  AORTA Ao Root diam: 3.70 cm Ao Asc diam:  3.40 cm MITRAL VALVE MV Area (PHT): 4.31 cm     SHUNTS MV Decel Time: 176 msec     Systemic VTI:  0.20 m MV E velocity: 80.40 cm/s   Systemic Diam: 2.10 cm MV A velocity: 126.00 cm/s MV E/A ratio:  0.64 Toribio Fuel MD Electronically signed by Toribio Fuel MD Signature Date/Time: 05/17/2024/11:03:28 AM    Final    CT HEAD WO CONTRAST ( ) Result Date: 05/17/2024 EXAM: CT HEAD WITHOUT CONTRAST 05/17/2024 04:06:37 AM TECHNIQUE: CT of the head was performed without the administration of intravenous contrast. Automated exposure control, iterative reconstruction, and/or weight based adjustment of the mA/kV was utilized to reduce the radiation dose to as low as reasonably achievable. COMPARISON: None  available. CLINICAL HISTORY: 78 year old male. Syncope/presyncope, cerebrovascular cause suspected. FINDINGS: BRAIN AND VENTRICLES: No acute hemorrhage. Patchy asymmetric hypodensity posterior right lentiform nucleus, representing mild age indeterminate small vessel disease changes in the right basal ganglia. Otherwise, gray-white differentiation is within normal limits for age. Brain volume is normal for age. No hydrocephalus. No extra-axial collection. No mass effect or midline shift. Calcified atherosclerosis at the skull base. No suspicious intracranial vascular hyperdensity. ORBITS: No acute abnormality. SINUSES: Scattered paranasal sinus mild mucosal thickening. No sinus fluid levels identified. SOFT TISSUES AND SKULL: No acute soft tissue abnormality. No skull fracture. Middle ears and mastoids remain well aerated. IMPRESSION: 1. Mild age-indeterminate small vessel disease changes in the right basal ganglia. Electronically signed by: Helayne Hurst MD 05/17/2024 05:06 AM EST RP Workstation: [id number]   CT ABDOMEN PELVIS WO CONTRAST Result Date: 05/16/2024 CLINICAL DATA:  Abdominal pain EXAM: CT ABDOMEN AND PELVIS WITHOUT CONTRAST TECHNIQUE: Multidetector CT imaging of the abdomen and pelvis was performed following the standard protocol without IV contrast. RADIATION DOSE REDUCTION: This exam was performed according to the departmental dose-optimization program which includes automated exposure control, adjustment of the mA and/or kV according to patient size and/or use of iterative reconstruction technique. COMPARISON:  Chest CT 04/24/2024, MRI 04/12/2024, CT 10/31/2023 FINDINGS: Lower chest: Lung bases demonstrate chronic interstitial lung disease. No acute airspace disease. Hepatobiliary: Cholecystectomy. Moderate intra and extrahepatic biliary dilatation, common bile duct diameter measures 18 mm and may be slightly increased compared to prior. Pancreas: Unremarkable. No pancreatic ductal dilatation or  surrounding inflammatory changes. Spleen: Normal in size without focal abnormality. Adrenals/Urinary Tract: Adrenal glands are normal. Right renal cyst for which no imaging follow-up is recommended. No hydronephrosis. The bladder is unremarkable. Stomach/Bowel: Stomach nonenlarged. No dilated small bowel. No acute bowel wall thickening. Diverticular disease of the colon.  Vascular/Lymphatic: Aortic atherosclerosis. No enlarged abdominal or pelvic lymph nodes. Reproductive: Slightly enlarged prostate Other: Negative for ascites or free air. Small fat containing inguinal hernias. Musculoskeletal: No acute osseous abnormality IMPRESSION: 1. No CT evidence for acute intra-abdominal or pelvic abnormality. 2. Chronic interstitial lung disease at the bases. 3. Status post cholecystectomy. Moderate intra and extrahepatic biliary dilatation, common bile duct diameter measures 18 mm and appears slightly increased compared to prior exams. 4. Diverticular disease of the colon without acute inflammatory process. 5. Aortic atherosclerosis. Aortic Atherosclerosis (ICD10-I70.0). Electronically Signed   By: Luke Bun M.D.   On: 05/16/2024 20:07   DG Chest Portable 1 View Result Date: 05/16/2024 EXAM: 1 VIEW(S) XRAY OF THE CHEST 05/16/2024 04:57:00 PM COMPARISON: Chest CT dated 04/24/2024. CLINICAL HISTORY: syncope FINDINGS: LUNGS AND PLEURA: Chronic lung changes with pulmonary fibrosis and bronchiectasis are noted on the comparison chest CT dated 04/24/2024. No acute overlying pulmonary process. No pulmonary edema. No pleural effusion. No pneumothorax. HEART AND MEDIASTINUM: No acute abnormality of the cardiac and mediastinal silhouettes. BONES AND SOFT TISSUES: No acute osseous abnormality. IMPRESSION: 1. No acute cardiopulmonary process. 2. 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 Electronically signed by: Maude Stammer MD 05/16/2024 05:10 PM EST RP Workstation: [id number]   CT Chest  W Contrast Result Date: 04/24/2024 CLINICAL DATA:  Biliary cancer.  Staging.  * Tracking Code: BO * EXAM: CT CHEST WITH CONTRAST TECHNIQUE: Multidetector CT imaging of the chest was performed during intravenous contrast administration. RADIATION DOSE REDUCTION: This exam was performed according to the departmental dose-optimization program which includes automated exposure control, adjustment of the mA and/or kV according to patient size and/or use of iterative reconstruction technique. CONTRAST:  75mL OMNIPAQUE  IOHEXOL  300 MG/ML  SOLN COMPARISON:  06/21/2023 FINDINGS: Cardiovascular: The heart size is normal. No substantial pericardial effusion. Coronary artery calcification is evident. No thoracic aortic aneurysm. Mediastinum/Nodes: Scattered upper normal mediastinal and right hilar lymph nodes evident. The esophagus has normal imaging features. There is no axillary lymphadenopathy. Lungs/Pleura: Similar biapical pleuroparenchymal scarring, right greater than left including nodular components (25/6). Subpleural reticulation is seen in the lungs bilaterally, similar to prior. No overtly suspicious pulmonary nodule or mass. No focal airspace consolidation. There is no evidence of pleural effusion. Upper Abdomen: Intra and extrahepatic biliary duct dilatation evident. Liver perfusion is heterogeneous. Exophytic cyst noted upper pole right kidney. Musculoskeletal: No worrisome lytic or sclerotic osseous abnormality. IMPRESSION: 1. No definite evidence for metastatic disease in the chest. 2. Similar biapical pleuroparenchymal scarring, right greater than left including nodular components. 3. Diffuse subpleural reticulation in both lungs compatible with a component of underlying chronic interstitial lung disease. 4. Intra and extrahepatic biliary duct dilatation with heterogeneous liver perfusion. Electronically Signed   By: Camellia Candle M.D.   On: 04/24/2024 08:21     Subjective: - no chest pain, shortness of  breath, no abdominal pain, nausea or vomiting.   Discharge Exam: BP (!) 133/107 (BP Location: Right Arm)   Pulse 72   Temp 97.7 F (36.5 C)   Resp 19   Ht 6' 1 (1.854 m)   Wt 76.2 kg   SpO2 99%   BMI 22.16 kg/m   General: Pt is alert, awake, not in acute distress Cardiovascular: RRR, S1/S2 +, no rubs, no gallops Respiratory: CTA bilaterally, no wheezing, no rhonchi Abdominal: Soft, NT, ND, bowel sounds + Extremities: no edema, no cyanosis    The results of significant diagnostics from this hospitalization (including imaging, microbiology, ancillary and  laboratory) are listed below for reference.     Microbiology: Recent Results (from the past 240 hours)  Urine Culture (for pregnant, neutropenic or urologic patients or patients with an indwelling urinary catheter)     Status: Abnormal   Collection Time: 05/17/24  1:21 AM   Specimen: Urine, Clean Catch  Result Value Ref Range Status   Specimen Description URINE, CLEAN CATCH  Final   Special Requests   Final    NONE Performed at [facility], [address], [place], [place] [address]    Culture >=100,000 COLONIES/mL ENTEROCOCCUS FAECALIS (A)  Final   Report Status 05/19/2024 FINAL  Final   Organism ID, Bacteria ENTEROCOCCUS FAECALIS (A)  Final      Susceptibility   Enterococcus faecalis - MIC*    AMPICILLIN  <=2 SENSITIVE Sensitive     NITROFURANTOIN <=16 SENSITIVE Sensitive     VANCOMYCIN 1 SENSITIVE Sensitive     * >=100,000 COLONIES/mL ENTEROCOCCUS FAECALIS  Culture, blood (Routine X 2) w Reflex to ID Panel     Status: Abnormal   Collection Time: 05/17/24  9:33 PM   Specimen: BLOOD  Result Value Ref Range Status   Specimen Description BLOOD SITE NOT SPECIFIED  Final   Special Requests   Final    BOTTLES DRAWN AEROBIC AND ANAEROBIC Blood Culture adequate volume   Culture  Setup Time   Final    GRAM NEGATIVE RODS AEROBIC BOTTLE ONLY RBV [place] PHARMD 05/19/2024 @0647  BY DD Performed at [facility]  Lab, [address], [place], [place] [address]    Culture PSEUDOMONAS AERUGINOSA (A)  Final   Report Status 05/21/2024 FINAL  Final   Organism ID, Bacteria PSEUDOMONAS AERUGINOSA  Final      Susceptibility   Pseudomonas aeruginosa - MIC*    MEROPENEM 1 SENSITIVE Sensitive     CIPROFLOXACIN  0.5 SENSITIVE Sensitive     IMIPENEM 2 SENSITIVE Sensitive     PIP/TAZO Value in next row Sensitive      8 SENSITIVEThis is a modified FDA-approved test that has been validated and its performance characteristics determined by the reporting laboratory.  This laboratory is certified under the Clinical Laboratory Improvement Amendments CLIA as qualified to perform high complexity clinical laboratory testing.    CEFEPIME Value in next row Sensitive      8 SENSITIVEThis is a modified FDA-approved test that has been validated and its performance characteristics determined by the reporting laboratory.  This laboratory is certified under the Clinical Laboratory Improvement Amendments CLIA as qualified to perform high complexity clinical laboratory testing.    CEFTAZIDIME/AVIBACTAM Value in next row Sensitive      8 SENSITIVEThis is a modified FDA-approved test that has been validated and its performance characteristics determined by the reporting laboratory.  This laboratory is certified under the Clinical Laboratory Improvement Amendments CLIA as qualified to perform high complexity clinical laboratory testing.    CEFTOLOZANE/TAZOBACTAM Value in next row Sensitive      8 SENSITIVEThis is a modified FDA-approved test that has been validated and its performance characteristics determined by the reporting laboratory.  This laboratory is certified under the Clinical Laboratory Improvement Amendments CLIA as qualified to perform high complexity clinical laboratory testing.    TOBRAMYCIN Value in next row Sensitive      8 SENSITIVEThis is a modified FDA-approved test that has been validated and its performance characteristics  determined by the reporting laboratory.  This laboratory is certified under the Clinical Laboratory Improvement Amendments CLIA as qualified  to perform high complexity clinical laboratory testing.    CEFTAZIDIME Value in next row Sensitive      8 SENSITIVEThis is a modified FDA-approved test that has been validated and its performance characteristics determined by the reporting laboratory.  This laboratory is certified under the Clinical Laboratory Improvement Amendments CLIA as qualified to perform high complexity clinical laboratory testing.    * PSEUDOMONAS AERUGINOSA  Blood Culture ID Panel (Reflexed)     Status: Abnormal   Collection Time: 05/17/24  9:33 PM  Result Value Ref Range Status   Enterococcus faecalis NOT DETECTED NOT DETECTED Final   Enterococcus Faecium NOT DETECTED NOT DETECTED Final   Listeria monocytogenes NOT DETECTED NOT DETECTED Final   Staphylococcus species NOT DETECTED NOT DETECTED Final   Staphylococcus aureus (BCID) NOT DETECTED NOT DETECTED Final   Staphylococcus epidermidis NOT DETECTED NOT DETECTED Final   Staphylococcus lugdunensis NOT DETECTED NOT DETECTED Final   Streptococcus species NOT DETECTED NOT DETECTED Final   Streptococcus agalactiae NOT DETECTED NOT DETECTED Final   Streptococcus pneumoniae NOT DETECTED NOT DETECTED Final   Streptococcus pyogenes NOT DETECTED NOT DETECTED Final   A.calcoaceticus-baumannii NOT DETECTED NOT DETECTED Final   Bacteroides fragilis NOT DETECTED NOT DETECTED Final   Enterobacterales NOT DETECTED NOT DETECTED Final   Enterobacter cloacae complex NOT DETECTED NOT DETECTED Final   Escherichia coli NOT DETECTED NOT DETECTED Final   Klebsiella aerogenes NOT DETECTED NOT DETECTED Final   Klebsiella oxytoca NOT DETECTED NOT DETECTED Final   Klebsiella pneumoniae NOT DETECTED NOT DETECTED Final   Proteus species NOT DETECTED NOT DETECTED Final   Salmonella species NOT DETECTED NOT DETECTED Final   Serratia marcescens NOT  DETECTED NOT DETECTED Final   Haemophilus influenzae NOT DETECTED NOT DETECTED Final   Neisseria meningitidis NOT DETECTED NOT DETECTED Final   Pseudomonas aeruginosa DETECTED (A) NOT DETECTED Final    Comment: CRITICAL RESULT CALLED TO, READ BACK BY AND VERIFIED WITH: RBV WYLAND PHARMD 05/19/2024 @0647  BY DD    Stenotrophomonas maltophilia NOT DETECTED NOT DETECTED Final   Candida albicans NOT DETECTED NOT DETECTED Final   Candida auris NOT DETECTED NOT DETECTED Final   Candida glabrata NOT DETECTED NOT DETECTED Final   Candida krusei NOT DETECTED NOT DETECTED Final   Candida parapsilosis NOT DETECTED NOT DETECTED Final   Candida tropicalis NOT DETECTED NOT DETECTED Final   Cryptococcus neoformans/gattii NOT DETECTED NOT DETECTED Final   CTX-M ESBL NOT DETECTED NOT DETECTED Final   Carbapenem resistance IMP NOT DETECTED NOT DETECTED Final   Carbapenem resistance KPC NOT DETECTED NOT DETECTED Final   Carbapenem resistance NDM NOT DETECTED NOT DETECTED Final   Carbapenem resistance VIM NOT DETECTED NOT DETECTED Final    Comment: Performed at [facility], [address] [place], [place] [address]  Culture, blood (Routine X 2) w Reflex to ID Panel     Status: Abnormal   Collection Time: 05/17/24  9:38 PM   Specimen: BLOOD  Result Value Ref Range Status   Specimen Description BLOOD SITE NOT SPECIFIED  Final   Special Requests   Final    BOTTLES DRAWN AEROBIC AND ANAEROBIC Blood Culture adequate volume   Culture  Setup Time   Final    GRAM NEGATIVE RODS AEROBIC BOTTLE ONLY CRITICAL VALUE NOTED.  VALUE IS CONSISTENT WITH PREVIOUSLY REPORTED AND CALLED VALUE.    Culture (A)  Final    PSEUDOMONAS AERUGINOSA SUSCEPTIBILITIES PERFORMED ON PREVIOUS CULTURE WITHIN THE LAST 5 DAYS. Performed at [facility], [address], [place], [place] [address]    Report Status 05/21/2024 FINAL  Final     Labs: Basic Metabolic Panel: Recent Labs  Lab 05/16/24 2357 05/17/24 0342  05/18/24 0243 05/19/24 0459 05/20/24 0327 05/21/24 0455 05/22/24 0436  NA  --    < > 141 145 147* 144 146*  K  --    < > 3.3* 3.1* 3.9 3.3* 3.8  CL  --    < > 106 110 113* 111 113*  CO2  --    < > 22 23 21* 22 23  GLUCOSE  --    < > 150* 95 100* 80 112*  BUN  --    < > 43* 34* 30* 30* 24*  CREATININE 3.19*   < > 2.61* 2.28* 1.95* 1.82* 1.57*  CALCIUM  --    < > 7.9* 8.3* 8.3* 8.1* 8.1*  MG 1.9  --   --  1.7  --   --  1.4*   < > = values in this interval not displayed.   Liver Function Tests: Recent Labs  Lab 05/18/24 0243 05/19/24 0459 05/20/24 0327 05/21/24 0455 05/22/24 0436  AST 55* 57* 60* 60* 58*  ALT 50* 50* 45* 45* 46*  ALKPHOS 276* 255* 233* 217* 220*  BILITOT 17.1* 14.8* 13.5* 11.4* 9.3*  PROT 5.9* 6.1* 5.9* 5.6* 5.7*  ALBUMIN <1.5* <1.5* <1.5* <1.5* <1.5*   CBC: Recent Labs  Lab 05/16/24 1645 05/16/24 1852 05/18/24 0243 05/19/24 0459 05/20/24 0327 05/21/24 0455 05/22/24 0436  WBC 15.1*   < > 19.1* 17.0* 14.1* 9.9 11.8*  NEUTROABS 12.7*  --   --   --   --   --   --   HGB 10.7*   < > 9.0* 9.2* 9.2* 9.7* 9.2*  HCT 31.0*   < > 25.6* 25.7* 26.4* 27.9* 27.0*  MCV 81.8   < > 80.3 79.3* 79.8* 80.6 81.3  PLT 495*   < > 424* 414* 446* 420* 449*   < > = values in this interval not displayed.   CBG: No results for input(s): GLUCAP in the last 168 hours. Hgb A1c No results for input(s): HGBA1C in the last 72 hours. Lipid Profile No results for input(s): CHOL, HDL, LDLCALC, TRIG, CHOLHDL, LDLDIRECT in the last 72 hours. Thyroid function studies No results for input(s): TSH, T4TOTAL, T3FREE, THYROIDAB in the last 72 hours.  Invalid input(s): FREET3 Urinalysis    Component Value Date/Time   COLORURINE AMBER (A) 05/17/2024 0946   APPEARANCEUR CLEAR 05/17/2024 0946   LABSPEC 1.009 05/17/2024 0946   PHURINE 5.0 05/17/2024 0946   GLUCOSEU NEGATIVE 05/17/2024 0946   HGBUR NEGATIVE 05/17/2024 0946   BILIRUBINUR SMALL (A) 05/17/2024  0946   KETONESUR NEGATIVE 05/17/2024 0946   PROTEINUR NEGATIVE 05/17/2024 0946   NITRITE NEGATIVE 05/17/2024 0946   LEUKOCYTESUR NEGATIVE 05/17/2024 0946    FURTHER DISCHARGE INSTRUCTIONS:   Get Medicines reviewed and adjusted: Please take all your medications with you for your next visit with your Primary MD   Laboratory/radiological data: Please request your Primary MD to go over all hospital tests and procedure/radiological results at the follow up, please ask your Primary MD to get all Hospital records sent to his/her office.   In some cases, they will be blood work, cultures and biopsy results pending at the time of your discharge. Please request that your primary care M.D. goes through all the records of your hospital data and follows up  on these results.   Also Note the following: If you experience worsening of your admission symptoms, develop shortness of breath, life threatening emergency, suicidal or homicidal thoughts you must seek medical attention immediately by calling 911 or calling your MD immediately  if symptoms less severe.   You must read complete instructions/literature along with all the possible adverse reactions/side effects for all the Medicines you take and that have been prescribed to you. Take any new Medicines after you have completely understood and accpet all the possible adverse reactions/side effects.    Do not drive when taking Pain medications or sleeping medications (Benzodaizepines)   Do not take more than prescribed Pain, Sleep and Anxiety Medications. It is not advisable to combine anxiety,sleep and pain medications without talking with your primary care practitioner   Special Instructions: If you have smoked or chewed Tobacco  in the last 2 yrs please stop smoking, stop any regular Alcohol  and or any Recreational drug use.   Wear Seat belts while driving.   Please note: You were cared for by a hospitalist during your hospital stay. Once you are  discharged, your primary care physician will handle any further medical issues. Please note that NO REFILLS for any discharge medications will be authorized once you are discharged, as it is imperative that you return to your primary care physician (or establish a relationship with a primary care physician if you do not have one) for your post hospital discharge needs so that they can reassess your need for medications and monitor your lab values.  Time coordinating discharge: 35 minutes  SIGNED:  Nilda Fendt, MD, PhD 05/22/2024, 8:34 AM

## 2024-05-22 NOTE — Discharge Instructions (Signed)
 Follow with Turmel, Caleb P, PA in 5-7 days  Please get a complete blood count and chemistry panel checked by your Primary MD at your next visit, and again as instructed by your Primary MD. Please get your medications reviewed and adjusted by your Primary MD.  Please request your Primary MD to go over all Hospital Tests and Procedure/Radiological results at the follow up, please get all Hospital records sent to your Prim MD by signing hospital release before you go home.  In some cases, there will be blood work, cultures and biopsy results pending at the time of your discharge. Please request that your primary care M.D. goes through all the records of your hospital data and follows up on these results.  If you had Pneumonia of Lung problems at the Hospital: Please get a 2 view Chest X ray done in 6-8 weeks after hospital discharge or sooner if instructed by your Primary MD.  If you have Congestive Heart Failure: Please call your Cardiologist or Primary MD anytime you have any of the following symptoms:  1) 3 pound weight gain in 24 hours or 5 pounds in 1 week  2) shortness of breath, with or without a dry hacking cough  3) swelling in the hands, feet or stomach  4) if you have to sleep on extra pillows at night in order to breathe  Follow cardiac low salt diet and 1.5 lit/day fluid restriction.  If you have diabetes Accuchecks 4 times/day, Once in AM empty stomach and then before each meal. Log in all results and show them to your primary doctor at your next visit. If any glucose reading is under 80 or above 300 call your primary MD immediately.  If you have Seizure/Convulsions/Epilepsy: Please do not drive, operate heavy machinery, participate in activities at heights or participate in high speed sports until you have seen by Primary MD or a Neurologist and advised to do so again. Per [place] statutes, patients with seizures are not allowed to drive until they have been  seizure-free for six months.  Use caution when using heavy equipment or power tools. Avoid working on ladders or at heights. Take showers instead of baths. Ensure the water temperature is not too high on the home water heater. Do not go swimming alone. Do not lock yourself in a room alone (i.e. bathroom). When caring for infants or small children, sit down when holding, feeding, or changing them to minimize risk of injury to the child in the event you have a seizure. Maintain good sleep hygiene. Avoid alcohol.   If you had Gastrointestinal Bleeding: Please ask your Primary MD to check a complete blood count within one week of discharge or at your next visit. Your endoscopic/colonoscopic biopsies that are pending at the time of discharge, will also need to followed by your Primary MD.  Get Medicines reviewed and adjusted. Please take all your medications with you for your next visit with your Primary MD  Please request your Primary MD to go over all hospital tests and procedure/radiological results at the follow up, please ask your Primary MD to get all Hospital records sent to his/her office.  If you experience worsening of your admission symptoms, develop shortness of breath, life threatening emergency, suicidal or homicidal thoughts you must seek medical attention immediately by calling 911 or calling your MD immediately  if symptoms less severe.  You must read complete instructions/literature along with all the possible adverse reactions/side effects for all the Medicines you  take and that have been prescribed to you. Take any new Medicines after you have completely understood and accpet all the possible adverse reactions/side effects.   Do not drive or operate heavy machinery when taking Pain medications.   Do not take more than prescribed Pain, Sleep and Anxiety Medications  Special Instructions: If you have smoked or chewed Tobacco  in the last 2 yrs please stop smoking, stop any regular  Alcohol  and or any Recreational drug use.  Wear Seat belts while driving.  Please note You were cared for by a hospitalist during your hospital stay. If you have any questions about your discharge medications or the care you received while you were in the hospital after you are discharged, you can call the unit and asked to speak with the hospitalist on call if the hospitalist that took care of you is not available. Once you are discharged, your primary care physician will handle any further medical issues. Please note that NO REFILLS for any discharge medications will be authorized once you are discharged, as it is imperative that you return to your primary care physician (or establish a relationship with a primary care physician if you do not have one) for your aftercare needs so that they can reassess your need for medications and monitor your lab values.  You can reach the hospitalist office at phone [phone] or fax [phone]   If you do not have a primary care physician, you can call [phone] for a physician referral.  Activity: As tolerated with Full fall precautions use walker/cane & assistance as needed    Diet: regular  Disposition Home

## 2024-05-22 NOTE — Plan of Care (Signed)
   Problem: Clinical Measurements: Goal: Ability to maintain clinical measurements within normal limits will improve Outcome: Progressing Goal: Will remain free from infection Outcome: Progressing

## 2024-05-23 NOTE — Telephone Encounter (Signed)
 Care everywhere reviewed.  Patient had been admitted with a syncopal event and bacteremia due to cholangitis.  He has since been discharged home

## 2024-05-24 ENCOUNTER — Inpatient Hospital Stay (HOSPITAL_COMMUNITY): Admit: 2024-05-24 | Admitting: General Surgery

## 2024-05-24 SURGERY — WHIPPLE PROCEDURE
Anesthesia: General

## 2024-05-29 DIAGNOSIS — C221 Intrahepatic bile duct carcinoma: Secondary | ICD-10-CM | POA: Diagnosis not present

## 2024-05-29 DIAGNOSIS — R7881 Bacteremia: Secondary | ICD-10-CM | POA: Diagnosis not present

## 2024-05-29 DIAGNOSIS — N179 Acute kidney failure, unspecified: Secondary | ICD-10-CM | POA: Diagnosis not present

## 2024-05-29 DIAGNOSIS — E876 Hypokalemia: Secondary | ICD-10-CM | POA: Diagnosis not present

## 2024-06-06 ENCOUNTER — Ambulatory Visit: Attending: Internal Medicine | Admitting: Physical Therapy

## 2024-06-06 ENCOUNTER — Encounter: Payer: Self-pay | Admitting: Physical Therapy

## 2024-06-06 ENCOUNTER — Other Ambulatory Visit: Payer: Self-pay

## 2024-06-06 DIAGNOSIS — M6281 Muscle weakness (generalized): Secondary | ICD-10-CM | POA: Diagnosis not present

## 2024-06-06 DIAGNOSIS — R2689 Other abnormalities of gait and mobility: Secondary | ICD-10-CM | POA: Insufficient documentation

## 2024-06-06 DIAGNOSIS — R531 Weakness: Secondary | ICD-10-CM | POA: Insufficient documentation

## 2024-06-06 DIAGNOSIS — E860 Dehydration: Secondary | ICD-10-CM | POA: Insufficient documentation

## 2024-06-06 NOTE — Therapy (Signed)
 OUTPATIENT PHYSICAL THERAPY LOWER EXTREMITY EVALUATION   Patient Name: Andrew Douglas MRN: [id number] DOB:1946/01/27, 78 y.o., male Today's Date: 06/06/24  END OF SESSION:   Past Medical History:  Diagnosis Date   Hepatitis C    Hypertension    Past Surgical History:  Procedure Laterality Date   BACK SURGERY     a little back problem 20 or 25 years ago   BILIARY DILATION  10/04/2023   Procedure: DILATION, STRICTURE, BILE DUCT;  Surgeon: [name] Kitchens, MD;  Location: [facility];  Service: Gastroenterology;;   CHOLECYSTECTOMY N/A 10/03/2023   Procedure: LAPAROSCOPIC CHOLECYSTECTOMY WITH ICG DYE AND  INTRAOPERATIVE CHOLANGIOGRAM;  Surgeon: Vanderbilt Ned, MD;  Location: [facility];  Service: General;  Laterality: N/A;  ICG   ERCP N/A 10/04/2023   Procedure: ERCP, WITH INTERVENTION IF INDICATED;  Surgeon: [name] Kitchens, MD;  Location: [facility];  Service: Gastroenterology;  Laterality: N/A;   ERCP N/A 11/01/2023   Procedure: ERCP, WITH INTERVENTION IF INDICATED;  Surgeon: [name] Kitchens, MD;  Location: [facility];  Service: Gastroenterology;  Laterality: N/A;   ERCP N/A 04/03/2024   Procedure: ERCP, WITH INTERVENTION IF INDICATED;  Surgeon: [name] Kitchens, MD;  Location: [facility];  Service: Gastroenterology;  Laterality: N/A;  stent removal   ERCP N/A 05/20/2024   Procedure: ERCP, WITH INTERVENTION IF INDICATED;  Surgeon: Avram Lupita BRAVO, MD;  Location: [facility];  Service: Gastroenterology;  Laterality: N/A;   ESOPHAGOGASTRODUODENOSCOPY N/A 10/02/2023   Procedure: EGD (ESOPHAGOGASTRODUODENOSCOPY);  Surgeon: Avram Lupita BRAVO, MD;  Location: [facility];  Service: Gastroenterology;  Laterality: N/A;   SPHINCTEROTOMY  10/04/2023   Procedure: SPHINCTEROTOMY, BILIARY;  Surgeon: [name] Kitchens, MD;  Location: [facility];  Service: Gastroenterology;;   Patient Active Problem List   Diagnosis Date Noted   Bacteremia due to Pseudomonas 05/21/2024   Cholangitis (HCC) 05/20/2024    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   Syncope and collapse 05/16/2024   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   Intractable nausea and vomiting 11/01/2023   Hyperbilirubinemia 11/01/2023   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 Potassium tablets Edema PCP-holding BP meds now, follow up next week for recheck    PERTINENT HISTORY: From DC summary: Hospital Course / Discharge diagnoses: Principal problem Cholangiocarcinoma, prior biliary stent placement and removal, history of cholecystectomy, elevated LFTs-most recent stent was removed September 2025 by Dr. [name].  He has a planned Whipple procedure on 11/13 with Dr. [name].  Imaging during this admission showed increase severe intra and extrahepatic bile duct dilatation with tapering at the level of the ampulla.  Status post successful ERCP 11/9 with stent placement.  He recovered well following the procedure, bilirubin is coming down, he is able to tolerate a regular diet without nausea or vomiting.  He was initially was supposed to get his Whipple procedure in 11/13, however this has been postponed by general surgery, he will have follow-up as an outpatient. PAIN:  Are you having pain? No  PRECAUTIONS: None  RED FLAGS: None   WEIGHT BEARING RESTRICTIONS: No  FALLS:  Has patient fallen in last 6 months? 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  DIAGNOSTIC FINDINGS: N/A  PATIENT SURVEYS:  PSFS: THE PATIENT SPECIFIC FUNCTIONAL SCALE  Place score of 0-10 (0 = unable to perform activity and 10 = able to perform activity at the same level as before injury or problem)  Activity Date: 06/06/24    Walk slow 6    2. Lifting 6    3. Stand up and get up 6    4.      Total Score 18      Total Score = Sum of activity scores/number of activities  Minimally Detectable Change: 3 points (for single activity); 2 points (for average score)  [facility] (nd). The Patient Specific Functional Scale . Retrieved from Skateo[employer].[email or web address]   COGNITION: Overall cognitive status: Within functional limits for tasks assessed     SENSATION: WFL  EDEMA:  Edema present in BLE L>R, pt reports is improving   POSTURE: rounded shoulders and forward head   LOWER EXTREMITY ROM: WNL BLE  Active ROM Right eval Left eval  Hip flexion    Hip extension    Hip abduction    Hip adduction    Hip internal rotation    Hip external rotation    Knee flexion    Knee extension    Ankle dorsiflexion    Ankle plantarflexion    Ankle inversion    Ankle eversion     (Blank rows = not tested)  LOWER EXTREMITY MMT:  MMT Right eval Left eval  Hip flexion 4/5 4-/5  Hip extension    Hip abduction 4/5 4/5  Hip adduction 4+/5 4+/5  Hip internal rotation    Hip external rotation    Knee flexion 4/5 4-/5  Knee extension 5/5 4+/5  Ankle dorsiflexion 5/5 5/5  Ankle  plantarflexion 5/5 5/5  Ankle inversion    Ankle eversion     (Blank rows = not tested)  FUNCTIONAL TESTS:     Timed up and go (TUG): 8.55s   30 sec Chair Rise Test: 12   GAIT: Distance walked: lobby to treatment area Assistive device utilized: None Level of assistance: Complete Independence Comments: no major deficits with this distance                                                                                                                                TREATMENT DATE:   [facility] Adult PT Treatment:  DATE: 06/06/24  Self Care: HEP developed and reviewed Pt edu completed     PATIENT EDUCATION:  Education details: Pt educated on relevant anatomy, physiology, pathology, diagnosis, prognosis, progression of care, pain and activity modification related to dehydration and recent hospital stay Person educated: Patient and Spouse Education method: Explanation, Demonstration, and Handouts Education comprehension: verbalized understanding and returned demonstration  HOME EXERCISE PROGRAM: Access Code: [email or web address] URL: [email or web address] Date: 06/06/2024 Prepared by: Stann Ohara  Exercises - Walking  - 1 x daily - 5 x weekly - Sit to Stand  - 1 x daily - 4 x weekly - 3 sets - 5 reps - 2 hold - Active Straight Leg Raise with Quad Set  - 1 x daily - 4 x weekly - 3 sets - 10 reps - 2 hold - Standing 3-way Hip with Walker  - 1 x daily - 4 x weekly - 3 sets - 10 reps - 2 hold  ASSESSMENT:  CLINICAL IMPRESSION: Patient is a 78 y.o. M who was seen today for physical therapy evaluation and treatment for dehydration and weakness following recent hospital stay. Pt was admitted 05/16/24-05/22/24 following syncopal episode. He states that since dc he has been working to restore function and improve endurance. Pt has been limited in activity tolerance and has been modifying work schedule and home tasks and activities to  tolerance. 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 Pt is planning for a whipple procedure in the future as well and would like to be stronger going into the surgery. Pt stands to benefit from continued skilled physical therapy to address deficit areas and restore safety with activities and participations at home and in the community.    OBJECTIVE IMPAIRMENTS: decreased activity tolerance, decreased endurance, decreased mobility, decreased strength, and increased edema.   ACTIVITY LIMITATIONS: carrying, lifting, squatting, and stairs  PARTICIPATION LIMITATIONS: meal prep, cleaning, laundry, shopping, community activity, occupation, and yard work  PERSONAL FACTORS: Time since onset of injury/illness/exacerbation and 1-2 comorbidities: history of cancer and recent hospital admission are also affecting patient's functional outcome.   REHAB POTENTIAL: Excellent  CLINICAL DECISION MAKING: Stable/uncomplicated  EVALUATION COMPLEXITY: Low   GOALS: Goals reviewed with patient? Yes  SHORT TERM GOALS AND LONG TERM GOALS: Target date: 07/21/24   Pt will report compliance with HEP to work towards ind and home management strategies Baseline: Goal status: INITIAL     2. Pt will score no less than 30/30 on PSFS to demonstrate improved activity tolerance Baseline: 18/30 Goal status: INITIAL     3.  Pt will be ind in the management of their symptoms at home and in the community Baseline:  Goal status: INITIAL      PLAN:  PT FREQUENCY: 1x/week  PT DURATION: 6 weeks  PLANNED INTERVENTIONS: 97110-Therapeutic exercises, 97530- Therapeutic activity, [id number]- Neuromuscular re-education, 97535- Self Care, 02859- Manual therapy, [id number]- Gait training, 97016- Vasopneumatic device, [phone] (1-2 muscles), 20561 (3+ muscles)- Dry Needling, Patient/Family education, Cryotherapy, and Moist heat  PLAN FOR NEXT SESSION: lower quarter strength, stability, endurance,  mobility, through functional movement patterns, update HEP as indicated   Stann DELENA Ohara, PT 06/06/24

## 2024-06-12 DIAGNOSIS — E876 Hypokalemia: Secondary | ICD-10-CM | POA: Diagnosis not present

## 2024-06-12 DIAGNOSIS — L509 Urticaria, unspecified: Secondary | ICD-10-CM | POA: Diagnosis not present

## 2024-06-12 DIAGNOSIS — I10 Essential (primary) hypertension: Secondary | ICD-10-CM | POA: Diagnosis not present

## 2024-06-12 DIAGNOSIS — D649 Anemia, unspecified: Secondary | ICD-10-CM | POA: Diagnosis not present

## 2024-06-19 ENCOUNTER — Telehealth: Payer: Self-pay | Admitting: Oncology

## 2024-06-19 NOTE — Telephone Encounter (Signed)
 I returned patient's call to cancel lab and MD appointments for 06/20/2024. I have made MD aware to advise next steps.

## 2024-06-20 ENCOUNTER — Inpatient Hospital Stay: Attending: Oncology

## 2024-06-20 ENCOUNTER — Inpatient Hospital Stay: Admitting: Oncology

## 2024-06-28 ENCOUNTER — Ambulatory Visit: Attending: Internal Medicine

## 2024-06-28 DIAGNOSIS — M6281 Muscle weakness (generalized): Secondary | ICD-10-CM | POA: Insufficient documentation

## 2024-06-28 DIAGNOSIS — R2689 Other abnormalities of gait and mobility: Secondary | ICD-10-CM | POA: Insufficient documentation

## 2024-06-28 NOTE — Therapy (Signed)
 OUTPATIENT PHYSICAL THERAPY LOWER EXTREMITY TREATMENT   Patient Name: Andrew Douglas MRN: [id number] DOB:10/04/1945, 78 y.o., male Today's Date: 06/06/24  END OF SESSION:  PT End of Session - 06/28/24 1718     Visit Number 2    Number of Visits 7    Date for Recertification  07/21/24    PT Start Time 1545    PT Stop Time 1629    PT Time Calculation (min) 44 min    Activity Tolerance Patient tolerated treatment well    Behavior During Therapy WFL for tasks assessed/performed          Past Medical History:  Diagnosis Date   Hepatitis C    Hypertension    Past Surgical History:  Procedure Laterality Date   BACK SURGERY     a little back problem 20 or 25 years ago   BILIARY DILATION  10/04/2023   Procedure: DILATION, STRICTURE, BILE DUCT;  Surgeon: [name] Kitchens, MD;  Location: [facility];  Service: Gastroenterology;;   CHOLECYSTECTOMY N/A 10/03/2023   Procedure: LAPAROSCOPIC CHOLECYSTECTOMY WITH ICG DYE AND  INTRAOPERATIVE CHOLANGIOGRAM;  Surgeon: Vanderbilt Ned, MD;  Location: [facility];  Service: General;  Laterality: N/A;  ICG   ERCP N/A 10/04/2023   Procedure: ERCP, WITH INTERVENTION IF INDICATED;  Surgeon: [name] Kitchens, MD;  Location: [facility];  Service: Gastroenterology;  Laterality: N/A;   ERCP N/A 11/01/2023   Procedure: ERCP, WITH INTERVENTION IF INDICATED;  Surgeon: [name] Kitchens, MD;  Location: [facility];  Service: Gastroenterology;  Laterality: N/A;   ERCP N/A 04/03/2024   Procedure: ERCP, WITH INTERVENTION IF INDICATED;  Surgeon: [name] Kitchens, MD;  Location: [facility];  Service: Gastroenterology;  Laterality: N/A;  stent removal   ERCP N/A 05/20/2024   Procedure: ERCP, WITH INTERVENTION IF INDICATED;  Surgeon: Avram Lupita BRAVO, MD;  Location: [facility];  Service: Gastroenterology;  Laterality: N/A;   ESOPHAGOGASTRODUODENOSCOPY N/A 10/02/2023   Procedure: EGD (ESOPHAGOGASTRODUODENOSCOPY);  Surgeon: Avram Lupita BRAVO, MD;  Location: [facility];  Service:  Gastroenterology;  Laterality: N/A;   SPHINCTEROTOMY  10/04/2023   Procedure: SPHINCTEROTOMY, BILIARY;  Surgeon: [name] Kitchens, MD;  Location: [facility];  Service: Gastroenterology;;   Patient Active Problem List   Diagnosis Date Noted   Bacteremia due to Pseudomonas 05/21/2024   Cholangitis (HCC) 05/20/2024   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   Syncope and collapse 05/16/2024   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 Pt reports he does HEP and remains active at home but most recent hospital stay made him a little weak and he states desire to get better and stronger.   Complex GI and gall bladder stenting Stent removed x2 symptomatic both times 2nd time symptomatic with syncopal eisode,taken to hospital  Pt has been trying to get out more and has been working towards restoring function, errands, yard work. Potassium tablets Edema PCP-holding BP meds now, follow up next week for recheck    PERTINENT HISTORY: From DC summary: Hospital Course / Discharge diagnoses: Principal  problem Cholangiocarcinoma, prior biliary stent placement and removal, history of cholecystectomy, elevated LFTs-most recent stent was removed September 2025 by Dr. [name].  He has a planned Whipple procedure on 11/13 with Dr. [name].  Imaging during this admission showed increase severe intra and extrahepatic bile duct dilatation with tapering at the level of the ampulla.   Status post successful ERCP 11/9 with stent placement.  He recovered well following the procedure, bilirubin is coming down, he is able to tolerate a regular diet without nausea or vomiting.  He was initially was supposed to get his Whipple procedure in 11/13, however this has been postponed by general surgery, he will have follow-up as an outpatient. PAIN:  Are you having pain? No  PRECAUTIONS: None  RED FLAGS: None   WEIGHT BEARING RESTRICTIONS: No  FALLS:  Has patient fallen in last 6 months? 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  DIAGNOSTIC FINDINGS: N/A  PATIENT SURVEYS:  PSFS: THE PATIENT SPECIFIC FUNCTIONAL SCALE  Place score of 0-10 (0 = unable to perform activity and 10 = able to perform activity at the same level as before injury or problem)  Activity Date: 06/06/24    Walk slow 6    2. Lifting 6    3. Stand up and get up 6    4.      Total Score 18      Total Score = Sum of activity scores/number of activities  Minimally Detectable Change: 3 points (for single activity); 2 points (for average score)  [facility] (nd). The Patient Specific Functional Scale . Retrieved from [email or web address]   COGNITION: Overall cognitive status: Within functional limits for tasks assessed     SENSATION: WFL  EDEMA:  Edema present in BLE L>R, pt reports is improving   POSTURE: rounded shoulders and forward head   LOWER EXTREMITY ROM: WNL BLE  Active ROM Right eval Left eval  Hip flexion    Hip  extension    Hip abduction    Hip adduction    Hip internal rotation    Hip external rotation    Knee flexion    Knee extension    Ankle dorsiflexion    Ankle plantarflexion    Ankle inversion    Ankle eversion     (Blank rows = not tested)  LOWER EXTREMITY MMT:  MMT Right eval Left eval  Hip flexion 4/5 4-/5  Hip extension    Hip abduction 4/5 4/5  Hip adduction 4+/5 4+/5  Hip internal rotation    Hip external rotation    Knee flexion 4/5 4-/5  Knee extension 5/5 4+/5  Ankle dorsiflexion 5/5 5/5  Ankle plantarflexion 5/5 5/5  Ankle inversion    Ankle eversion     (Blank rows = not tested)  FUNCTIONAL TESTS:     Timed up and go (TUG): 8.55s   30 sec Chair Rise Test: 12   GAIT: Distance walked: lobby to treatment area Assistive device utilized: None Level of assistance: Complete Independence Comments: no major deficits with this distance  TREATMENT DATE:  06/28/24 NuStep L5 x  STS with OHP yellow weighted ball 2x10 Step Ups 6 2x10 Marches on airex 1x20 Static balance on airex (eyes closed, feet togther, tandem) Leg Press 60# 2x10 Cable Pulldowns 2x10 20# Standing Rows 2x10 20# Calf Stretch 2x:20s SLR stretch  [facility] Adult PT Treatment:                                                DATE: 06/06/24  Self Care: HEP developed and reviewed Pt edu completed     PATIENT EDUCATION:  Education details: Pt educated on relevant anatomy, physiology, pathology, diagnosis, prognosis, progression of care, pain and activity modification related to dehydration and recent hospital stay Person educated: Patient and Spouse Education method: Explanation, Demonstration, and Handouts Education comprehension: verbalized understanding and returned demonstration  HOME EXERCISE PROGRAM: Access Code: [email or web address] URL:  [email or web address] Date: 06/06/2024 Prepared by: Stann Ohara  Exercises - Walking  - 1 x daily - 5 x weekly - Sit to Stand  - 1 x daily - 4 x weekly - 3 sets - 5 reps - 2 hold - Active Straight Leg Raise with Quad Set  - 1 x daily - 4 x weekly - 3 sets - 10 reps - 2 hold - Standing 3-way Hip with Walker  - 1 x daily - 4 x weekly - 3 sets - 10 reps - 2 hold  ASSESSMENT:  CLINICAL IMPRESSION: Pt exhibits good movement quality with ability to participate in balance activities only requiring SBA. Pt able to progress strengthening exercises with occasional cues for body mechanics and positioning to target specific muscle groups. Pt will benefit from continued LE strengthening and general conditioning in preparation for another hospital stay with whipple surgery scheduled for January 2026.     Patient is a 78 y.o. M who was seen today for physical therapy evaluation and treatment for dehydration and weakness following recent hospital stay. Pt was admitted 05/16/24-05/22/24 following syncopal episode. He states that since dc he has been working to restore function and improve endurance. Pt has been limited in activity tolerance and has been modifying work schedule and home tasks and activities to tolerance. 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 Pt is planning for a whipple procedure in the future as well and would like to be stronger going into the surgery. Pt stands to benefit from continued skilled physical therapy to address deficit areas and restore safety with activities and participations at home and in the community.    OBJECTIVE IMPAIRMENTS: decreased activity tolerance, decreased endurance, decreased mobility, decreased strength, and increased edema.   ACTIVITY LIMITATIONS: carrying, lifting, squatting, and stairs  PARTICIPATION LIMITATIONS: meal prep, cleaning, laundry, shopping, community activity, occupation, and yard  work  PERSONAL FACTORS: Time since onset of injury/illness/exacerbation and 1-2 comorbidities: history of cancer and recent hospital admission are also affecting patient's functional outcome.   REHAB POTENTIAL: Excellent  CLINICAL DECISION MAKING: Stable/uncomplicated  EVALUATION COMPLEXITY: Low   GOALS: Goals reviewed with patient? Yes  SHORT TERM GOALS AND LONG TERM GOALS: Target date: 07/21/24   Pt will report compliance with HEP to work towards ind and home management strategies Baseline: Goal status: MET 06/28/24     2. Pt will score no less than 30/30 on PSFS to demonstrate improved activity tolerance  Baseline: 18/30 Goal status: INITIAL     3.  Pt will be ind in the management of their symptoms at home and in the community Baseline:  Goal status: IN PROGRESS 06/28/24      PLAN:  PT FREQUENCY: 1x/week  PT DURATION: 6 weeks  PLANNED INTERVENTIONS: 97110-Therapeutic exercises, 97530- Therapeutic activity, 97112- Neuromuscular re-education, 97535- Self Care, 02859- Manual therapy, [id number]- Gait training, 97016- Vasopneumatic device, [phone] (1-2 muscles), 20561 (3+ muscles)- Dry Needling, Patient/Family education, Cryotherapy, and Moist heat  PLAN FOR NEXT SESSION: lower quarter strength, stability, endurance, mobility, through functional movement patterns, update HEP as indicated   Thersia Alder, Student-PT 06/28/24

## 2024-07-03 ENCOUNTER — Encounter: Payer: Self-pay | Admitting: Physical Therapy

## 2024-07-03 ENCOUNTER — Ambulatory Visit: Admitting: Physical Therapy

## 2024-07-03 DIAGNOSIS — R2689 Other abnormalities of gait and mobility: Secondary | ICD-10-CM | POA: Diagnosis not present

## 2024-07-03 DIAGNOSIS — M6281 Muscle weakness (generalized): Secondary | ICD-10-CM

## 2024-07-03 NOTE — Therapy (Signed)
 " OUTPATIENT PHYSICAL THERAPY LOWER EXTREMITY TREATMENT   Patient Name: Andrew Douglas MRN: [id number] DOB:09-Jun-1946, 78 y.o., male Today's Date: 06/06/24  END OF SESSION:  PT End of Session - 07/03/24 1347     Visit Number 3    Date for Recertification  07/21/24    PT Start Time 1345    PT Stop Time 1430    PT Time Calculation (min) 45 min    Activity Tolerance Patient tolerated treatment well    Behavior During Therapy WFL for tasks assessed/performed          Past Medical History:  Diagnosis Date   Hepatitis C    Hypertension    Past Surgical History:  Procedure Laterality Date   BACK SURGERY     a little back problem 20 or 25 years ago   BILIARY DILATION  10/04/2023   Procedure: DILATION, STRICTURE, BILE DUCT;  Surgeon: [name] Kitchens, MD;  Location: [facility];  Service: Gastroenterology;;   CHOLECYSTECTOMY N/A 10/03/2023   Procedure: LAPAROSCOPIC CHOLECYSTECTOMY WITH ICG DYE AND  INTRAOPERATIVE CHOLANGIOGRAM;  Surgeon: Vanderbilt Ned, MD;  Location: [facility];  Service: General;  Laterality: N/A;  ICG   ERCP N/A 10/04/2023   Procedure: ERCP, WITH INTERVENTION IF INDICATED;  Surgeon: [name] Kitchens, MD;  Location: [facility];  Service: Gastroenterology;  Laterality: N/A;   ERCP N/A 11/01/2023   Procedure: ERCP, WITH INTERVENTION IF INDICATED;  Surgeon: [name] Kitchens, MD;  Location: [facility];  Service: Gastroenterology;  Laterality: N/A;   ERCP N/A 04/03/2024   Procedure: ERCP, WITH INTERVENTION IF INDICATED;  Surgeon: [name] Kitchens, MD;  Location: [facility];  Service: Gastroenterology;  Laterality: N/A;  stent removal   ERCP N/A 05/20/2024   Procedure: ERCP, WITH INTERVENTION IF INDICATED;  Surgeon: Avram Lupita BRAVO, MD;  Location: [facility];  Service: Gastroenterology;  Laterality: N/A;   ESOPHAGOGASTRODUODENOSCOPY N/A 10/02/2023   Procedure: EGD (ESOPHAGOGASTRODUODENOSCOPY);  Surgeon: Avram Lupita BRAVO, MD;  Location: [facility];  Service: Gastroenterology;   Laterality: N/A;   SPHINCTEROTOMY  10/04/2023   Procedure: SPHINCTEROTOMY, BILIARY;  Surgeon: [name] Kitchens, MD;  Location: [facility];  Service: Gastroenterology;;   Patient Active Problem List   Diagnosis Date Noted   Bacteremia due to Pseudomonas 05/21/2024   Cholangitis (HCC) 05/20/2024   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 Feel good overall.   Complex GI and gall bladder stenting Stent removed x2 symptomatic both times 2nd time symptomatic with syncopal eisode,taken to hospital  Pt has been trying to get out more and has been working towards restoring function, errands, yard work. Potassium tablets Edema PCP-holding BP meds now, follow up next week for recheck    PERTINENT HISTORY: From DC summary: Hospital Course / Discharge diagnoses: Principal problem Cholangiocarcinoma, prior biliary stent placement and removal, history of cholecystectomy, elevated LFTs-most recent stent was removed September 2025 by Dr. [name].  He has a planned Whipple procedure on 11/13 with Dr. [name].  Imaging during this admission showed increase  severe intra and extrahepatic bile duct dilatation with tapering at the level of the ampulla.  Status post successful ERCP 11/9 with stent placement.  He recovered well following the procedure, bilirubin is coming down, he is able to tolerate a regular diet without nausea or  vomiting.  He was initially was supposed to get his Whipple procedure in 11/13, however this has been postponed by general surgery, he will have follow-up as an outpatient. PAIN:  Are you having pain? No  PRECAUTIONS: None  RED FLAGS: None   WEIGHT BEARING RESTRICTIONS: No  FALLS:  Has patient fallen in last 6 months? 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  DIAGNOSTIC FINDINGS: N/A  PATIENT SURVEYS:  PSFS: THE PATIENT SPECIFIC FUNCTIONAL SCALE  Place score of 0-10 (0 = unable to perform activity and 10 = able to perform activity at the same level as before injury or problem)  Activity Date: 06/06/24    Walk slow 6    2. Lifting 6    3. Stand up and get up 6    4.      Total Score 18      Total Score = Sum of activity scores/number of activities  Minimally Detectable Change: 3 points (for single activity); 2 points (for average score)  [facility] (nd). The Patient Specific Functional Scale . Retrieved from [employer]ateo[employer].com.pt   COGNITION: Overall cognitive status: Within functional limits for tasks assessed     SENSATION: WFL  EDEMA:  Edema present in BLE L>R, pt reports is improving   POSTURE: rounded shoulders and forward head   LOWER EXTREMITY ROM: WNL BLE  Active ROM Right eval Left eval  Hip flexion    Hip extension    Hip abduction    Hip adduction    Hip internal rotation    Hip external rotation    Knee flexion    Knee extension    Ankle dorsiflexion    Ankle plantarflexion     Ankle inversion    Ankle eversion     (Blank rows = not tested)  LOWER EXTREMITY MMT:  MMT Right eval Left eval  Hip flexion 4/5 4-/5  Hip extension    Hip abduction 4/5 4/5  Hip adduction 4+/5 4+/5  Hip internal rotation    Hip external rotation    Knee flexion 4/5 4-/5  Knee extension 5/5 4+/5  Ankle dorsiflexion 5/5 5/5  Ankle plantarflexion 5/5 5/5  Ankle inversion    Ankle eversion     (Blank rows = not tested)  FUNCTIONAL TESTS:     Timed up and go (TUG): 8.55s   30 sec Chair Rise Test: 12   GAIT: Distance walked: lobby to treatment area Assistive device utilized: None Level of assistance: Complete Independence Comments: no major deficits with this distance  TREATMENT DATE:  07/03/24 NuStep L 5 x 6 min HS curls 35lb 2x10 Leg Ext 15lb 2x10 Shoulder Ext & Rows 10lb 2x10 S2S w/ OHP yellow ball 2x10 Leg press 50lb 2x15 6in step ups x10 each Slant board calf stretch  06/28/24 NuStep L5 x  STS with OHP yellow weighted ball 2x10 Step Ups 6 2x10 Marches on airex 1x20 Static balance on airex (eyes closed, feet togther, tandem) Leg Press 60# 2x10 Cable Pulldowns 2x10 20# Standing Rows 2x10 20# Calf Stretch 2x:20s SLR stretch  [facility] Adult PT Treatment:                                                DATE: 06/06/24  Self Care: HEP developed and reviewed Pt edu completed     PATIENT EDUCATION:  Education details: Pt educated on relevant anatomy, physiology, pathology, diagnosis, prognosis, progression of care, pain and activity modification related to dehydration and recent hospital stay Person educated: Patient and Spouse Education method: Explanation, Demonstration, and Handouts Education comprehension: verbalized understanding and returned demonstration  HOME EXERCISE PROGRAM: Access Code: [email or web address] URL:  [email or web address] Date: 06/06/2024 Prepared by: Stann Ohara  Exercises - Walking  - 1 x daily - 5 x weekly - Sit to Stand  - 1 x daily - 4 x weekly - 3 sets - 5 reps - 2 hold - Active Straight Leg Raise with Quad Set  - 1 x daily - 4 x weekly - 3 sets - 10 reps - 2 hold - Standing 3-way Hip with Walker  - 1 x daily - 4 x weekly - 3 sets - 10 reps - 2 hold  ASSESSMENT:  CLINICAL IMPRESSION: Pt enters doing well. He  exhibits good movement quality with ability to participate. Pt able to progress strengthening exercises with occasional cues for body mechanics and positioning to target specific muscle groups.  Pt will benefit from continued LE strengthening and general conditioning in preparation for another hospital stay with whipple surgery scheduled for January 2026.     Patient is a 78 y.o. M who was seen today for physical therapy evaluation and treatment for dehydration and weakness following recent hospital stay. Pt was admitted 05/16/24-05/22/24 following syncopal episode. He states that since dc he has been working to restore function and improve endurance. Pt has been limited in activity tolerance and has been modifying work schedule and home tasks and activities to tolerance. 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 Pt is planning for a whipple procedure in the future as well and would like to be stronger going into the surgery. Pt stands to benefit from continued skilled physical therapy to address deficit areas and restore safety with activities and participations at home and in the community.    OBJECTIVE IMPAIRMENTS: decreased activity tolerance, decreased endurance, decreased mobility, decreased strength, and increased edema.   ACTIVITY LIMITATIONS: carrying, lifting, squatting, and stairs  PARTICIPATION LIMITATIONS: meal prep, cleaning, laundry, shopping, community activity, occupation, and yard work  PERSONAL FACTORS:  Time since onset of injury/illness/exacerbation and 1-2 comorbidities: history of cancer and recent hospital admission are also affecting patient's functional outcome.   REHAB POTENTIAL: Excellent  CLINICAL DECISION MAKING: Stable/uncomplicated  EVALUATION COMPLEXITY: Low   GOALS: Goals reviewed with patient? Yes  SHORT TERM GOALS AND LONG TERM GOALS: Target date:  07/21/24   Pt will report compliance with HEP to work towards ind and home management strategies Baseline: Goal status: MET 06/28/24     2. Pt will score no less than 30/30 on PSFS to demonstrate improved activity tolerance Baseline: 18/30 Goal status: INITIAL     3.  Pt will be ind in the management of their symptoms at home and in the community Baseline:  Goal status: IN PROGRESS 07/03/24      PLAN:  PT FREQUENCY: 1x/week  PT DURATION: 6 weeks  PLANNED INTERVENTIONS: 97110-Therapeutic exercises, 97530- Therapeutic activity, 97112- Neuromuscular re-education, 97535- Self Care, 02859- Manual therapy, [id number]- Gait training, 97016- Vasopneumatic device, [phone] (1-2 muscles), 20561 (3+ muscles)- Dry Needling, Patient/Family education, Cryotherapy, and Moist heat  PLAN FOR NEXT SESSION: lower quarter strength, stability, endurance, mobility, through functional movement patterns, update HEP as indicated   Tanda KANDICE Sorrow, PTA 06/28/24  "

## 2024-07-09 ENCOUNTER — Ambulatory Visit

## 2024-07-09 DIAGNOSIS — R2689 Other abnormalities of gait and mobility: Secondary | ICD-10-CM | POA: Diagnosis not present

## 2024-07-09 DIAGNOSIS — M6281 Muscle weakness (generalized): Secondary | ICD-10-CM | POA: Diagnosis not present

## 2024-07-09 NOTE — Therapy (Signed)
 " OUTPATIENT PHYSICAL THERAPY LOWER EXTREMITY TREATMENT   Patient Name: Andrew Douglas MRN: [id number] DOB:07/17/1945, 78 y.o., male Today's Date: 06/06/24  END OF SESSION:  PT End of Session - 07/09/24 1608     Visit Number 4    Date for Recertification  07/21/24    PT Start Time 1535    PT Stop Time 1615    PT Time Calculation (min) 40 min    Activity Tolerance Patient tolerated treatment well    Behavior During Therapy WFL for tasks assessed/performed         Past Medical History:  Diagnosis Date   Hepatitis C    Hypertension    Past Surgical History:  Procedure Laterality Date   BACK SURGERY     a little back problem 20 or 25 years ago   BILIARY DILATION  10/04/2023   Procedure: DILATION, STRICTURE, BILE DUCT;  Surgeon: [name] Kitchens, MD;  Location: [facility];  Service: Gastroenterology;;   CHOLECYSTECTOMY N/A 10/03/2023   Procedure: LAPAROSCOPIC CHOLECYSTECTOMY WITH ICG DYE AND  INTRAOPERATIVE CHOLANGIOGRAM;  Surgeon: Vanderbilt Ned, MD;  Location: [facility];  Service: General;  Laterality: N/A;  ICG   ERCP N/A 10/04/2023   Procedure: ERCP, WITH INTERVENTION IF INDICATED;  Surgeon: [name] Kitchens, MD;  Location: [facility];  Service: Gastroenterology;  Laterality: N/A;   ERCP N/A 11/01/2023   Procedure: ERCP, WITH INTERVENTION IF INDICATED;  Surgeon: [name] Kitchens, MD;  Location: [facility];  Service: Gastroenterology;  Laterality: N/A;   ERCP N/A 04/03/2024   Procedure: ERCP, WITH INTERVENTION IF INDICATED;  Surgeon: [name] Kitchens, MD;  Location: [facility];  Service: Gastroenterology;  Laterality: N/A;  stent removal   ERCP N/A 05/20/2024   Procedure: ERCP, WITH INTERVENTION IF INDICATED;  Surgeon: Avram Lupita BRAVO, MD;  Location: [facility] END[facility];  Service: Gastroenterology;  Laterality: N/A;   ESOPHAGOGASTRODUODENOSCOPY N/A 10/02/2023   Procedure: EGD (ESOPHAGOGASTRODUODENOSCOPY);  Surgeon: Avram Lupita BRAVO, MD;  Location: [facility];  Service: Gastroenterology;  Laterality:  N/A;   SPHINCTEROTOMY  10/04/2023   Procedure: SPHINCTEROTOMY, BILIARY;  Surgeon: [name] Kitchens, MD;  Location: [facility];  Service: Gastroenterology;;   Patient Active Problem List   Diagnosis Date Noted   Bacteremia due to Pseudomonas 05/21/2024   Cholangitis (HCC) 05/20/2024   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   Syncope and collapse 05/16/2024   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   Complex GI and gall bladder stenting Stent removed x2 symptomatic both times 2nd time symptomatic with syncopal eisode,taken to hospital  Pt has been trying to get out more and has been working towards restoring function, errands, yard work. Potassium tablets Edema PCP-holding BP meds now, follow up next week for recheck    PERTINENT HISTORY: From DC summary: Hospital Course / Discharge diagnoses: Principal problem Cholangiocarcinoma, prior biliary stent placement and removal, history of cholecystectomy, elevated LFTs-most recent stent was removed September 2025 by Dr. [name].  He has a planned Whipple  procedure on 11/13 with Dr. [name].  Imaging during this admission showed increase severe intra and extrahepatic bile duct dilatation with tapering at the level of the ampulla.  Status post successful ERCP 11/9 with stent placement.  He recovered well following the procedure, bilirubin is coming down,  he is able to tolerate a regular diet without nausea or vomiting.  He was initially was supposed to get his Whipple procedure in 11/13, however this has been postponed by general surgery, he will have follow-up as an outpatient. PAIN:  Are you having pain? No  PRECAUTIONS: None  RED FLAGS: None   WEIGHT BEARING RESTRICTIONS: No  FALLS:  Has patient fallen in last 6 months? 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  DIAGNOSTIC FINDINGS: N/A  PATIENT SURVEYS:  PSFS: THE PATIENT SPECIFIC FUNCTIONAL SCALE  Place score of 0-10 (0 = unable to perform activity and 10 = able to perform activity at the same level as before injury or problem)  Activity Date: 06/06/24 07/09/24   Walk slow 6 8   2. Lifting 6 8   3. Stand up and get up 6 8   4.      Total Score 18      Total Score = Sum of activity scores/number of activities  Minimally Detectable Change: 3 points (for single activity); 2 points (for average score)  [facility] (nd). The Patient Specific Functional Scale . Retrieved from [email or web address].com.[email or web address]   COGNITION: Overall cognitive status: Within functional limits for tasks assessed     SENSATION: WFL  EDEMA:  Edema present in BLE L>R, pt reports is improving   POSTURE: rounded shoulders and forward head   LOWER EXTREMITY ROM: WNL BLE  Active ROM Right eval Left eval  Hip flexion    Hip extension    Hip abduction    Hip adduction    Hip internal rotation    Hip external rotation    Knee flexion     Knee extension    Ankle dorsiflexion    Ankle plantarflexion    Ankle inversion    Ankle eversion     (Blank rows = not tested)  LOWER EXTREMITY MMT:  MMT Right eval Left eval  Hip flexion 4/5 4-/5  Hip extension    Hip abduction 4/5 4/5  Hip adduction 4+/5 4+/5  Hip internal rotation    Hip external rotation    Knee flexion 4/5 4-/5  Knee extension 5/5 4+/5  Ankle dorsiflexion 5/5 5/5  Ankle plantarflexion 5/5 5/5  Ankle inversion    Ankle eversion     (Blank rows = not tested)  FUNCTIONAL TESTS:     Timed up and go (TUG): 8.55s   30 sec Chair Rise Test: 12   GAIT: Distance walked: lobby to treatment area Assistive device utilized: None Level of assistance: Complete Independence Comments: no major deficits with this distance  TREATMENT DATE:  07/09/24 Hip abduction 2x10 3# ankle weights  Seated Rows 35# 2x10 Pallof Press 2x10 Lateral Step ups 6 Leg Press 60 # 2x10 Resisted Gait 4 way 30# Cone Taps on Airex 2x20 STS on airex w/ OHP yellow ball NuStep L5 x 6 min    07/03/24 NuStep L 5 x 6 min HS curls 35lb 2x10 Leg Ext 15lb 2x10 Shoulder Ext & Rows 10lb 2x10 S2S w/ OHP yellow ball 2x10 Leg press 50lb 2x15 6in step ups x10 each Slant board calf stretch  06/28/24 NuStep L5 x  STS with OHP yellow weighted ball 2x10 Step Ups 6 2x10 Marches on airex 1x20 Static balance on airex (eyes closed, feet togther, tandem) Leg Press 60# 2x10 Cable Pulldowns 2x10 20# Standing Rows 2x10 20# Calf Stretch 2x:20s SLR stretch  [facility] Adult PT Treatment:                                                DATE: 06/06/24  Self Care: HEP developed and reviewed Pt edu completed     PATIENT EDUCATION:  Education details: Pt educated on relevant anatomy, physiology, pathology, diagnosis, prognosis, progression of care, pain and activity  modification related to dehydration and recent hospital stay Person educated: Patient and Spouse Education method: Explanation, Demonstration, and Handouts Education comprehension: verbalized understanding and returned demonstration  HOME EXERCISE PROGRAM: Access Code: [email or web address] URL: [email or web address] Date: 06/06/2024 Prepared by: Stann Ohara  Exercises - Walking  - 1 x daily - 5 x weekly - Sit to Stand  - 1 x daily - 4 x weekly - 3 sets - 5 reps - 2 hold - Active Straight Leg Raise with Quad Set  - 1 x daily - 4 x weekly - 3 sets - 10 reps - 2 hold - Standing 3-way Hip with Walker  - 1 x daily - 4 x weekly - 3 sets - 10 reps - 2 hold  ASSESSMENT:  CLINICAL IMPRESSION: Pt enters doing well. He  exhibits good movement quality with ability to participate in increased weight lifting and resistance activities. Pt able to progress strengthening exercises with occasional cues for body mechanics and positioning to target specific muscle groups specifically hip extension and trunk extension. Pt will benefit from continued LE strengthening and general conditioning to aid in ease of ADLs.    Patient is a 78 y.o. M who was seen today for physical therapy evaluation and treatment for dehydration and weakness following recent hospital stay. Pt was admitted 05/16/24-05/22/24 following syncopal episode. He states that since dc he has been working to restore function and improve endurance. Pt has been limited in activity tolerance and has been modifying work schedule and home tasks and activities to tolerance. 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 Pt is planning for a whipple procedure in the future as well and would like to be stronger going into the surgery. Pt stands to benefit from continued skilled physical therapy to address deficit areas and restore safety with activities and participations at home and in the community.    OBJECTIVE  IMPAIRMENTS: decreased activity tolerance, decreased endurance, decreased mobility, decreased strength, and increased edema.   ACTIVITY LIMITATIONS: carrying, lifting, squatting, and stairs  PARTICIPATION LIMITATIONS: meal prep, cleaning, laundry, shopping, community activity, occupation, and yard work  PERSONAL FACTORS:  Time since onset of injury/illness/exacerbation and 1-2 comorbidities: history of cancer and recent hospital admission are also affecting patient's functional outcome.   REHAB POTENTIAL: Excellent  CLINICAL DECISION MAKING: Stable/uncomplicated  EVALUATION COMPLEXITY: Low   GOALS: Goals reviewed with patient? Yes  SHORT TERM GOALS AND LONG TERM GOALS: Target date: 07/21/24   Pt will report compliance with HEP to work towards ind and home management strategies Baseline: Goal status: MET 06/28/24     2. Pt will score no less than 30/30 on PSFS to demonstrate improved activity tolerance Baseline: 18/30 Goal status: IN PROGRESS 07/09/24     3.  Pt will be ind in the management of their symptoms at home and in the community Baseline:  Goal status: IN PROGRESS 07/03/24; MET 07/09/24      PLAN:  PT FREQUENCY: 1x/week  PT DURATION: 6 weeks  PLANNED INTERVENTIONS: 97110-Therapeutic exercises, 97530- Therapeutic activity, 97112- Neuromuscular re-education, 97535- Self Care, 02859- Manual therapy, [id number]- Gait training, 97016- Vasopneumatic device, [phone] (1-2 muscles), 20561 (3+ muscles)- Dry Needling, Patient/Family education, Cryotherapy, and Moist heat  PLAN FOR NEXT SESSION: lower quarter strength, stability, endurance, mobility, through functional movement patterns, update HEP as indicated   Thersia Alder, Student-PT 06/28/24  "

## 2024-07-10 NOTE — Telephone Encounter (Signed)
 12/30 called pt to confirm apt left vm informing pt if apt is not confirmed 48 hrs prior to apt we will cancel apt -sh

## 2024-07-11 ENCOUNTER — Other Ambulatory Visit (HOSPITAL_COMMUNITY): Payer: Self-pay

## 2024-07-17 ENCOUNTER — Ambulatory Visit: Attending: Internal Medicine

## 2024-07-17 DIAGNOSIS — R2689 Other abnormalities of gait and mobility: Secondary | ICD-10-CM | POA: Diagnosis present

## 2024-07-17 DIAGNOSIS — M6281 Muscle weakness (generalized): Secondary | ICD-10-CM | POA: Insufficient documentation

## 2024-07-17 NOTE — Therapy (Signed)
 " OUTPATIENT PHYSICAL THERAPY LOWER EXTREMITY TREATMENT   Patient Name: Andrew Douglas MRN: [id number] DOB:01-Oct-1945, 79 y.o., male Today's Date: 06/06/24  END OF SESSION:  PT End of Session - 07/17/24 1139     Visit Number 5    Date for Recertification  07/21/24    PT Start Time 1140    PT Stop Time 1225    PT Time Calculation (min) 45 min    Activity Tolerance Patient tolerated treatment well    Behavior During Therapy WFL for tasks assessed/performed          Past Medical History:  Diagnosis Date   Hepatitis C    Hypertension    Past Surgical History:  Procedure Laterality Date   BACK SURGERY     a little back problem 20 or 25 years ago   BILIARY DILATION  10/04/2023   Procedure: DILATION, STRICTURE, BILE DUCT;  Surgeon: [name] Kitchens, MD;  Location: [facility];  Service: Gastroenterology;;   CHOLECYSTECTOMY N/A 10/03/2023   Procedure: LAPAROSCOPIC CHOLECYSTECTOMY WITH ICG DYE AND  INTRAOPERATIVE CHOLANGIOGRAM;  Surgeon: Vanderbilt Ned, MD;  Location: [facility];  Service: General;  Laterality: N/A;  ICG   ERCP N/A 10/04/2023   Procedure: ERCP, WITH INTERVENTION IF INDICATED;  Surgeon: [name] Kitchens, MD;  Location: [facility];  Service: Gastroenterology;  Laterality: N/A;   ERCP N/A 11/01/2023   Procedure: ERCP, WITH INTERVENTION IF INDICATED;  Surgeon: [name] Kitchens, MD;  Location: [facility];  Service: Gastroenterology;  Laterality: N/A;   ERCP N/A 04/03/2024   Procedure: ERCP, WITH INTERVENTION IF INDICATED;  Surgeon: [name] Kitchens, MD;  Location: [facility];  Service: Gastroenterology;  Laterality: N/A;  stent removal   ERCP N/A 05/20/2024   Procedure: ERCP, WITH INTERVENTION IF INDICATED;  Surgeon: Avram Lupita BRAVO, MD;  Location: [facility];  Service: Gastroenterology;  Laterality: N/A;   ESOPHAGOGASTRODUODENOSCOPY N/A 10/02/2023   Procedure: EGD (ESOPHAGOGASTRODUODENOSCOPY);  Surgeon: Avram Lupita BRAVO, MD;  Location: [facility];  Service: Gastroenterology;   Laterality: N/A;   SPHINCTEROTOMY  10/04/2023   Procedure: SPHINCTEROTOMY, BILIARY;  Surgeon: [name] Kitchens, MD;  Location: [facility];  Service: Gastroenterology;;   Patient Active Problem List   Diagnosis Date Noted   Bacteremia due to Pseudomonas 05/21/2024   Cholangitis (HCC) 05/20/2024   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   Syncope and collapse 05/16/2024   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 Pt stated he is doing the exercises at home.   Complex GI and gall bladder stenting Stent removed x2 symptomatic both times 2nd time symptomatic with syncopal eisode,taken to hospital  Pt has been trying to get out more and has been working towards restoring function, errands, yard work. Potassium tablets Edema PCP-holding BP meds now, follow up next week for recheck    PERTINENT HISTORY: From DC summary: Hospital Course / Discharge diagnoses: Principal problem Cholangiocarcinoma, prior biliary stent placement and removal, history of cholecystectomy, elevated LFTs-most recent stent was removed September 2025 by Dr. [name].  He has a planned  Whipple procedure on 11/13 with Dr. [name].  Imaging during this admission showed increase severe intra and extrahepatic bile duct dilatation with tapering at the level of the ampulla.  Status post successful ERCP 11/9 with stent placement.  He recovered well following the procedure, bilirubin is coming  down, he is able to tolerate a regular diet without nausea or vomiting.  He was initially was supposed to get his Whipple procedure in 11/13, however this has been postponed by general surgery, he will have follow-up as an outpatient. PAIN:  Are you having pain? No  PRECAUTIONS: None  RED FLAGS: None   WEIGHT BEARING RESTRICTIONS: No  FALLS:  Has patient fallen in last 6 months? 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  DIAGNOSTIC FINDINGS: N/A  PATIENT SURVEYS:  PSFS: THE PATIENT SPECIFIC FUNCTIONAL SCALE  Place score of 0-10 (0 = unable to perform activity and 10 = able to perform activity at the same level as before injury or problem)  Activity Date: 06/06/24 07/09/24   Walk slow 6 8   2. Lifting 6 8   3. Stand up and get up 6 8   4.      Total Score 18      Total Score = Sum of activity scores/number of activities  Minimally Detectable Change: 3 points (for single activity); 2 points (for average score)  [facility] (nd). The Patient Specific Functional Scale . Retrieved from Skateoasis.com.pt   COGNITION: Overall cognitive status: Within functional limits for tasks assessed     SENSATION: WFL  EDEMA:  Edema present in BLE L>R, pt reports is improving   POSTURE: rounded shoulders and forward head   LOWER EXTREMITY ROM: WNL BLE  Active ROM Right eval Left eval  Hip flexion    Hip extension    Hip abduction    Hip adduction    Hip internal rotation    Hip external rotation    Knee flexion     Knee extension    Ankle dorsiflexion    Ankle plantarflexion    Ankle inversion    Ankle eversion     (Blank rows = not tested)  LOWER EXTREMITY MMT:  MMT Right eval Left eval  Hip flexion 4/5 4-/5  Hip extension    Hip abduction 4/5 4/5  Hip adduction 4+/5 4+/5  Hip internal rotation    Hip external rotation    Knee flexion 4/5 4-/5  Knee extension 5/5 4+/5  Ankle dorsiflexion 5/5 5/5  Ankle plantarflexion 5/5 5/5  Ankle inversion    Ankle eversion     (Blank rows = not tested)  FUNCTIONAL TESTS:     Timed up and go (TUG): 8.55s   30 sec Chair Rise Test: 12   GAIT: Distance walked: lobby to treatment area Assistive device utilized: None Level of assistance: Complete Independence Comments: no major deficits with this distance  TREATMENT DATE:  07/17/2024  Bike L 2.5 x  Pallof Press 2x10 15# Lateral Step Ups 2x10 8  Cone Taps on airex 2x20 Sea urchin color taps in sequence on airex  STS on airex with OHP yellow weighted ball 2x10 Ball toss/catch on airex Seated Rows 2x10 45# Cable puldowns 2x10 20#   07/09/24 Hip abduction 2x10 3# ankle weights  Seated Rows 35# 2x10 Pallof Press 2x10 Lateral Step ups 6 Leg Press 60 # 2x10 Resisted Gait 4 way 30# Cone Taps on Airex 2x20 STS on airex w/ OHP yellow ball NuStep L5 x 6 min    07/03/24 NuStep L 5 x 6 min HS curls 35lb 2x10 Leg Ext 15lb 2x10 Shoulder Ext & Rows 10lb 2x10 S2S w/ OHP yellow ball 2x10 Leg press 50lb 2x15 6in step ups x10 each Slant board calf stretch  06/28/24 NuStep L5 x  STS with OHP yellow weighted ball 2x10 Step Ups 6 2x10 Marches on airex 1x20 Static balance on airex (eyes closed, feet togther, tandem) Leg Press 60# 2x10 Cable Pulldowns 2x10 20# Standing Rows 2x10 20# Calf Stretch 2x:20s SLR stretch  [facility] Adult PT Treatment:                                                 DATE: 06/06/24  Self Care: HEP developed and reviewed Pt edu completed     PATIENT EDUCATION:  Education details: Pt educated on relevant anatomy, physiology, pathology, diagnosis, prognosis, progression of care, pain and activity modification related to dehydration and recent hospital stay Person educated: Patient and Spouse Education method: Explanation, Demonstration, and Handouts Education comprehension: verbalized understanding and returned demonstration  HOME EXERCISE PROGRAM: Access Code: [email or web address] URL: [email or web address] Date: 06/06/2024 Prepared by: Stann Ohara  Exercises - Walking  - 1 x daily - 5 x weekly - Sit to Stand  - 1 x daily - 4 x weekly - 3 sets - 5 reps - 2 hold - Active Straight Leg Raise with Quad Set  - 1 x daily - 4 x weekly - 3 sets - 10 reps - 2 hold - Standing 3-way Hip with Walker  - 1 x daily - 4 x weekly - 3 sets - 10 reps - 2 hold  ASSESSMENT:  CLINICAL IMPRESSION: Pt enters doing well. He  exhibits good movement quality with ability to participate in increased weight lifting and resistance activities. Pt requires CGA to SBA for dynamic balance activities tends to sway backward if loss of balance occurs during these activities; pt cued to shift weight anteriorly. Pt able to progress strengthening exercises with occasional cues for body mechanics and positioning to target specific muscle groups specifically hip extension and trunk extension. Pt will benefit from continued LE strengthening and general conditioning to aid in ease of ADLs. Pt re-cerification sent to go through 08/01/24 in order to cover next 2 visits prior to his whipple surgery on 08/02/24.   Patient is a 79 y.o. M who was seen today for physical therapy evaluation and treatment for dehydration and weakness following recent hospital stay. Pt was admitted 05/16/24-05/22/24 following syncopal episode. He states that since dc he has been  working to restore function and improve endurance. Pt has been limited in activity tolerance and has been modifying work schedule and home tasks and activities to tolerance. 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 Pt is planning for a whipple procedure in the future as well and would like to be stronger going into the surgery. Pt stands to benefit from continued skilled physical therapy to address deficit areas and restore safety with activities and participations at home and in the community.    OBJECTIVE IMPAIRMENTS: decreased activity tolerance, decreased endurance, decreased mobility, decreased strength, and increased edema.   ACTIVITY LIMITATIONS: carrying, lifting, squatting, and stairs  PARTICIPATION LIMITATIONS: meal prep, cleaning, laundry, shopping, community activity, occupation, and yard work  PERSONAL FACTORS: Time since onset of injury/illness/exacerbation and 1-2 comorbidities: history of cancer and recent hospital admission are also affecting patient's functional outcome.   REHAB POTENTIAL: Excellent  CLINICAL DECISION MAKING: Stable/uncomplicated  EVALUATION COMPLEXITY: Low   GOALS: Goals reviewed with patient? Yes  SHORT TERM GOALS AND LONG TERM GOALS: Target date: 08/01/24   Pt will report compliance with HEP to work towards ind and home management strategies Baseline: Goal status: MET 06/28/24     2. Pt will score no less than 30/30 on PSFS to demonstrate improved activity tolerance Baseline: 18/30 Goal status: IN PROGRESS 07/17/24     3.  Pt will be ind in the management of their symptoms at home and in the community Baseline:  Goal status: IN PROGRESS 07/03/24; MET 07/09/24      PLAN:  PT FREQUENCY: 1x/week  PT DURATION: 6 weeks  PLANNED INTERVENTIONS: 97110-Therapeutic exercises, 97530- Therapeutic activity, 97112- Neuromuscular re-education, 97535- Self Care, 02859- Manual therapy, [id number]- Gait training,  97016- Vasopneumatic device, [phone] (1-2 muscles), 20561 (3+ muscles)- Dry Needling, Patient/Family education, Cryotherapy, and Moist heat  PLAN FOR NEXT SESSION: lower quarter strength, stability, endurance, mobility, through functional movement patterns, update HEP as indicated   [employer], Student-PT  "

## 2024-07-20 ENCOUNTER — Telehealth: Payer: Self-pay

## 2024-07-20 NOTE — Telephone Encounter (Signed)
 Opened in error

## 2024-07-24 ENCOUNTER — Ambulatory Visit

## 2024-07-24 DIAGNOSIS — M6281 Muscle weakness (generalized): Secondary | ICD-10-CM

## 2024-07-24 DIAGNOSIS — R2689 Other abnormalities of gait and mobility: Secondary | ICD-10-CM

## 2024-07-24 NOTE — Therapy (Signed)
 " OUTPATIENT PHYSICAL THERAPY LOWER EXTREMITY TREATMENT/DISCHARGE   Patient Name: Andrew Douglas MRN: [id number] DOB:1946/01/25, 79 y.o., male Today's Date: 06/06/24  END OF SESSION:  PT End of Session - 07/24/24 1146     Visit Number 6    Date for Recertification  08/01/24    PT Start Time 1145    PT Stop Time 1230    PT Time Calculation (min) 45 min    Activity Tolerance Patient tolerated treatment well    Behavior During Therapy WFL for tasks assessed/performed         Past Medical History:  Diagnosis Date   Hepatitis C    Hypertension    Past Surgical History:  Procedure Laterality Date   BACK SURGERY     a little back problem 20 or 25 years ago   BILIARY DILATION  10/04/2023   Procedure: DILATION, STRICTURE, BILE DUCT;  Surgeon: [name] Kitchens, MD;  Location: [facility];  Service: Gastroenterology;;   CHOLECYSTECTOMY N/A 10/03/2023   Procedure: LAPAROSCOPIC CHOLECYSTECTOMY WITH ICG DYE AND  INTRAOPERATIVE CHOLANGIOGRAM;  Surgeon: Vanderbilt Ned, MD;  Location: [facility];  Service: General;  Laterality: N/A;  ICG   ERCP N/A 10/04/2023   Procedure: ERCP, WITH INTERVENTION IF INDICATED;  Surgeon: [name] Kitchens, MD;  Location: [facility];  Service: Gastroenterology;  Laterality: N/A;   ERCP N/A 11/01/2023   Procedure: ERCP, WITH INTERVENTION IF INDICATED;  Surgeon: [name] Kitchens, MD;  Location: [facility];  Service: Gastroenterology;  Laterality: N/A;   ERCP N/A 04/03/2024   Procedure: ERCP, WITH INTERVENTION IF INDICATED;  Surgeon: [name] Kitchens, MD;  Location: [facility];  Service: Gastroenterology;  Laterality: N/A;  stent removal   ERCP N/A 05/20/2024   Procedure: ERCP, WITH INTERVENTION IF INDICATED;  Surgeon: Avram Lupita BRAVO, MD;  Location: [facility];  Service: Gastroenterology;  Laterality: N/A;   ESOPHAGOGASTRODUODENOSCOPY N/A 10/02/2023   Procedure: EGD (ESOPHAGOGASTRODUODENOSCOPY);  Surgeon: Avram Lupita BRAVO, MD;  Location: [facility];  Service: Gastroenterology;   Laterality: N/A;   SPHINCTEROTOMY  10/04/2023   Procedure: SPHINCTEROTOMY, BILIARY;  Surgeon: [name] Kitchens, MD;  Location: [facility];  Service: Gastroenterology;;   Patient Active Problem List   Diagnosis Date Noted   Bacteremia due to Pseudomonas 05/21/2024   Cholangitis (HCC) 05/20/2024   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   Syncope and collapse 05/16/2024   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 Pt stated he is doing the exercises at home at feels good in his overall progression; pt states he wants this session to be his last.   Complex GI and gall bladder stenting Stent removed x2 symptomatic both times 2nd time symptomatic with syncopal eisode,taken to hospital  Pt has been trying to get out more and has been working towards restoring function, errands, yard work. Potassium tablets Edema PCP-holding BP meds now, follow up next week for recheck    PERTINENT HISTORY: From DC summary: Hospital Course / Discharge diagnoses: Principal problem Cholangiocarcinoma, prior biliary stent placement and removal, history of cholecystectomy,  elevated LFTs-most recent stent was removed September 2025 by Dr. [name].  He has a planned Whipple procedure on 11/13 with Dr. [name].  Imaging during this admission showed increase severe intra and extrahepatic bile duct dilatation with tapering at the level of the ampulla.  Status post successful ERCP 11/9  with stent placement.  He recovered well following the procedure, bilirubin is coming down, he is able to tolerate a regular diet without nausea or vomiting.  He was initially was supposed to get his Whipple procedure in 11/13, however this has been postponed by general surgery, he will have follow-up as an outpatient. PAIN:  Are you having pain? No  PRECAUTIONS: None  RED FLAGS: None   WEIGHT BEARING RESTRICTIONS: No  FALLS:  Has patient fallen in last 6 months? 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  DIAGNOSTIC FINDINGS: N/A  PATIENT SURVEYS:  PSFS: THE PATIENT SPECIFIC FUNCTIONAL SCALE  Place score of 0-10 (0 = unable to perform activity and 10 = able to perform activity at the same level as before injury or problem)  Activity Date: 06/06/24 07/09/24   Walk slow 6 8 10   2. Lifting 6 8 10   3. Stand up and get up 6 8 10   4.      Total Score 18      Total Score = Sum of activity scores/number of activities  Minimally Detectable Change: 3 points (for single activity); 2 points (for average score)  [facility] (nd). The Patient Specific Functional Scale . Retrieved from [employer].com.pt   COGNITION: Overall cognitive status: Within functional limits for tasks assessed     SENSATION: WFL  EDEMA:  Edema present in BLE L>R, pt reports is improving   POSTURE: rounded shoulders and forward head   LOWER EXTREMITY ROM: WNL BLE  Active ROM Right eval Left eval  Hip flexion    Hip extension    Hip  abduction    Hip adduction    Hip internal rotation    Hip external rotation    Knee flexion    Knee extension    Ankle dorsiflexion    Ankle plantarflexion    Ankle inversion    Ankle eversion     (Blank rows = not tested)  LOWER EXTREMITY MMT:  MMT Right eval Left eval  Hip flexion 4/5 4-/5  Hip extension    Hip abduction 4/5 4/5  Hip adduction 4+/5 4+/5  Hip internal rotation    Hip external rotation    Knee flexion 4/5 4-/5  Knee extension 5/5 4+/5  Ankle dorsiflexion 5/5 5/5  Ankle plantarflexion 5/5 5/5  Ankle inversion    Ankle eversion     (Blank rows = not tested)  FUNCTIONAL TESTS:     Timed up and go (TUG): 8.55s   30 sec Chair Rise Test: 12   GAIT: Distance walked: lobby to treatment area Assistive device utilized: None Level of assistance: Complete Independence Comments: no major deficits with this distance  TREATMENT DATE:  07/24/24 Bike L3 x 6 min  [employer] 20# 400' Pallof Press 2x10 10# Step Ups 8 form airex 2x10 STS w/ 10# 2x10 Weighted Marches on Airex SL stance on firm ground Weighted Hip abduction 3# 2x10   07/17/2024  Bike L 2.5 x  Pallof Press 2x10 15# Lateral Step Ups 2x10 8  Cone Taps on airex 2x20 Sea urchin color taps in sequence on airex  STS on airex with OHP yellow weighted ball 2x10 Ball toss/catch on airex Seated Rows 2x10 45# Cable puldowns 2x10 20#   07/09/24 Hip abduction 2x10 3# ankle weights  Seated Rows 35# 2x10 Pallof Press 2x10 Lateral Step ups 6 Leg Press 60 # 2x10 Resisted Gait 4 way 30# Cone Taps on Airex 2x20 STS on airex w/ OHP yellow ball NuStep L5 x 6 min    07/03/24 NuStep L 5 x 6 min HS curls 35lb 2x10 Leg Ext 15lb 2x10 Shoulder Ext & Rows 10lb 2x10 S2S w/ OHP yellow ball 2x10 Leg press 50lb 2x15 6in step ups x10 each Slant board calf  stretch  06/28/24 NuStep L5 x  STS with OHP yellow weighted ball 2x10 Step Ups 6 2x10 Marches on airex 1x20 Static balance on airex (eyes closed, feet togther, tandem) Leg Press 60# 2x10 Cable Pulldowns 2x10 20# Standing Rows 2x10 20# Calf Stretch 2x:20s SLR stretch  [facility] Adult PT Treatment:                                                DATE: 06/06/24  Self Care: HEP developed and reviewed Pt edu completed     PATIENT EDUCATION:  Education details: Pt educated on relevant anatomy, physiology, pathology, diagnosis, prognosis, progression of care, pain and activity modification related to dehydration and recent hospital stay Person educated: Patient and Spouse Education method: Explanation, Demonstration, and Handouts Education comprehension: verbalized understanding and returned demonstration  HOME EXERCISE PROGRAM: Access Code: [email or web address] URL: [email or web address] Date: 06/06/2024 Prepared by: Stann Ohara  Exercises - Walking  - 1 x daily - 5 x weekly - Sit to Stand  - 1 x daily - 4 x weekly - 3 sets - 5 reps - 2 hold - Active Straight Leg Raise with Quad Set  - 1 x daily - 4 x weekly - 3 sets - 10 reps - 2 hold - Standing 3-way Hip with Walker  - 1 x daily - 4 x weekly - 3 sets - 10 reps - 2 hold  ASSESSMENT:  CLINICAL IMPRESSION: Pt enters doing well. Pt exhibits good ability to replicate HEP and maintain activity outside of PT sessions. He  exhibits good movement quality with ability to participate in increased weight lifting and resistance activities. Pt requires CGA to SBA for dynamic balance activities tends to sway backward if loss of balance occurs during these activities; pt cued to shift weight anteriorly. Pt able to progress strengthening exercises with occasional cues for body mechanics and positioning to target specific muscle groups specifically hip extension and trunk extension. Pt discharged from PT today with ability to meet all goals  and demonstrated ability to perform advanced HEP.   Patient is a 79 y.o. M who was seen today for physical therapy evaluation and treatment for dehydration and weakness following recent hospital stay. Pt was admitted 05/16/24-05/22/24 following syncopal episode. He states that since dc  he has been working to restore function and improve endurance. Pt has been limited in activity tolerance and has been modifying work schedule and home tasks and activities to tolerance. 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 Pt is planning for a whipple procedure in the future as well and would like to be stronger going into the surgery. Pt stands to benefit from continued skilled physical therapy to address deficit areas and restore safety with activities and participations at home and in the community.    OBJECTIVE IMPAIRMENTS: decreased activity tolerance, decreased endurance, decreased mobility, decreased strength, and increased edema.   ACTIVITY LIMITATIONS: carrying, lifting, squatting, and stairs  PARTICIPATION LIMITATIONS: meal prep, cleaning, laundry, shopping, community activity, occupation, and yard work  PERSONAL FACTORS: Time since onset of injury/illness/exacerbation and 1-2 comorbidities: history of cancer and recent hospital admission are also affecting patient's functional outcome.   REHAB POTENTIAL: Excellent  CLINICAL DECISION MAKING: Stable/uncomplicated  EVALUATION COMPLEXITY: Low   GOALS: Goals reviewed with patient? Yes  SHORT TERM GOALS AND LONG TERM GOALS: Target date: 08/01/24   Pt will report compliance with HEP to work towards ind and home management strategies Baseline: Goal status: MET 06/28/24     2. Pt will score no less than 30/30 on PSFS to demonstrate improved activity tolerance Baseline: 18/30 Goal status: MET 07/24/24     3.  Pt will be ind in the management of their symptoms at home and in the community Baseline:  Goal  status: IN PROGRESS 07/03/24; MET 07/09/24      PLAN:  PT FREQUENCY: 1x/week  PT DURATION: 6 weeks  PLANNED INTERVENTIONS: 97110-Therapeutic exercises, 97530- Therapeutic activity, 97112- Neuromuscular re-education, 97535- Self Care, 02859- Manual therapy, [id number]- Gait training, 97016- Vasopneumatic device, [phone] (1-2 muscles), 20561 (3+ muscles)- Dry Needling, Patient/Family education, Cryotherapy, and Moist heat  PLAN FOR NEXT SESSION: lower quarter strength, stability, endurance, mobility, through functional movement patterns, update HEP as indicated  PHYSICAL THERAPY DISCHARGE SUMMARY  Visits from Start of Care: 6    Patient agrees to discharge. Patient goals were met. Patient is being discharged due to meeting the stated rehab goals.   Almetta Fam, PT, DPT Thersia Alder, Student-PT  "

## 2024-07-26 NOTE — Pre-Procedure Instructions (Signed)
 Surgical Instructions   Your procedure is scheduled on August 02, 2024. Report to [facility] Main Entrance A at 7:00 A.M., then check in with the Admitting office. Any questions or running late day of surgery: call [phone]  Questions prior to your surgery date: call [phone], Monday-Friday, 8am-4pm. If you experience any cold or flu symptoms such as cough, fever, chills, shortness of breath, etc. between now and your scheduled surgery, please notify us  at the above number.     Remember:  Do not eat after midnight the night before your surgery   You may drink clear liquids until 6:00AM the morning of your surgery.   Clear liquids allowed are: Water, Non-Citrus Juices (without pulp), Carbonated Beverages, Clear Tea (no milk, honey, etc.), Black Coffee Only (NO MILK, CREAM OR POWDERED CREAMER of any kind), and Gatorade.    Take these medicines the morning of surgery with A SIP OF WATER:     May take these medicines IF NEEDED: omeprazole  (PRILOSEC)   Follow your surgeon's instructions regarding stopping Aspirin prior to surgery. If no instructions were given, please contact your surgeon's office.   One week prior to surgery, STOP taking any Aleve, Naproxen, Ibuprofen , Motrin , Advil , Goody's, BC's, all herbal medications, fish oil, and non-prescription vitamins.                     Do NOT Smoke (Tobacco/Vaping) for 24 hours prior to your procedure.  If you use a CPAP at night, you may bring your mask/headgear for your overnight stay.   You will be asked to remove any contacts, glasses, piercing's, hearing aid's, dentures/partials prior to surgery. Please bring cases for these items if needed.    Your surgeon will determine if you are to be admitted or discharged the same day.  Patients discharged the day of surgery will not be allowed to drive home, and someone needs to stay with them for 24 hours.  SURGICAL WAITING ROOM VISITATION Patients may have no more than 2  support people in the waiting area - these visitors may rotate.   Pre-op nurse will coordinate an appropriate time for 2 ADULT support persons, who may not rotate, to accompany patient in pre-op.  Children under the age of 61 must have an adult with them who is not the patient and must remain in the main waiting area with an adult.  If the patient needs to stay at the hospital during part of their recovery, the visitor guidelines for inpatient rooms apply.  Please refer to the [facility] website for the visitor guidelines for any additional information.   If you received a COVID test during your pre-op visit  it is requested that you wear a mask when out in public, stay away from anyone that may not be feeling well and notify your surgeon if you develop symptoms. If you have been in contact with anyone that has tested positive in the last 10 days please notify you surgeon.      Pre-operative CHG Bathing Instructions   You can play a key role in reducing the risk of infection after surgery. Your skin needs to be as free of germs as possible. You can reduce the number of germs on your skin by washing with CHG (chlorhexidine  gluconate) soap before surgery. CHG is an antiseptic soap that kills germs and continues to kill germs even after washing.   DO NOT use if you have an allergy to chlorhexidine /CHG or antibacterial soaps. If your skin  becomes reddened or irritated, stop using the CHG and notify one of our RNs at [phone].              TAKE A SHOWER THE NIGHT BEFORE SURGERY   Please keep in mind the following:  DO NOT shave, including legs and underarms, 48 hours prior to surgery.   You may shave your face before/day of surgery.  Place clean sheets on your bed the night before surgery Use a clean washcloth (not used since being washed) for shower. DO NOT sleep with pet's night before surgery.  CHG Shower Instructions:  Wash your face and private area with normal soap. If you choose  to wash your hair, wash first with your normal shampoo.  After you use shampoo/soap, rinse your hair and body thoroughly to remove shampoo/soap residue.  Turn the water OFF and apply half the bottle of CHG soap to a CLEAN washcloth.  Apply CHG soap ONLY FROM YOUR NECK DOWN TO YOUR TOES (washing for 3-5 minutes)  DO NOT use CHG soap on face, private areas, open wounds, or sores.  Pay special attention to the area where your surgery is being performed.  If you are having back surgery, having someone wash your back for you may be helpful. Wait 2 minutes after CHG soap is applied, then you may rinse off the CHG soap.  Pat dry with a clean towel  Put on clean pajamas    Additional instructions for the day of surgery: If you choose, you may shower the morning of surgery with an antibacterial soap.  DO NOT APPLY any lotions, deodorants, cologne, or perfumes.   Do not wear jewelry or makeup Do not wear nail polish, gel polish, artificial nails, or any other type of covering on natural nails (fingers and toes) Do not bring valuables to the hospital. [facility] is not responsible for valuables/personal belongings. Put on clean/comfortable clothes.  Please brush your teeth.  Ask your nurse before applying any prescription medications to the skin.

## 2024-07-27 ENCOUNTER — Encounter (HOSPITAL_COMMUNITY)
Admission: RE | Admit: 2024-07-27 | Discharge: 2024-07-27 | Disposition: A | Discharge status: Home / Self Care | Source: Ambulatory Visit | Attending: General Surgery | Admitting: General Surgery

## 2024-07-27 ENCOUNTER — Other Ambulatory Visit: Payer: Self-pay

## 2024-07-27 ENCOUNTER — Encounter (HOSPITAL_COMMUNITY): Payer: Self-pay

## 2024-07-27 DIAGNOSIS — Z01812 Encounter for preprocedural laboratory examination: Secondary | ICD-10-CM | POA: Diagnosis present

## 2024-07-27 DIAGNOSIS — C221 Intrahepatic bile duct carcinoma: Secondary | ICD-10-CM | POA: Diagnosis not present

## 2024-07-27 DIAGNOSIS — R739 Hyperglycemia, unspecified: Secondary | ICD-10-CM | POA: Insufficient documentation

## 2024-07-27 HISTORY — DX: Malignant (primary) neoplasm, unspecified: C80.1

## 2024-07-27 LAB — COMPREHENSIVE METABOLIC PANEL WITH GFR
ALT: 20 U/L (ref 0–44)
AST: 24 U/L (ref 15–41)
Albumin: 4.1 g/dL (ref 3.5–5.0)
Alkaline Phosphatase: 110 U/L (ref 38–126)
Anion gap: 9 (ref 5–15)
BUN: 18 mg/dL (ref 8–23)
CO2: 26 mmol/L (ref 22–32)
Calcium: 9.9 mg/dL (ref 8.9–10.3)
Chloride: 105 mmol/L (ref 98–111)
Creatinine, Ser: 0.93 mg/dL (ref 0.61–1.24)
GFR, Estimated: >60 mL/min
Glucose, Bld: 88 mg/dL (ref 70–99)
Potassium: 4.1 mmol/L (ref 3.5–5.1)
Sodium: 140 mmol/L (ref 135–145)
Total Bilirubin: 1.1 mg/dL (ref 0.0–1.2)
Total Protein: 7.6 g/dL (ref 6.5–8.1)

## 2024-07-27 LAB — CBC WITH DIFFERENTIAL/PLATELET
Abs Immature Granulocytes: 0.01 K/uL (ref 0.00–0.07)
Basophils Absolute: 0.1 K/uL (ref 0.0–0.1)
Basophils Relative: 1 %
Eosinophils Absolute: 0.5 K/uL (ref 0.0–0.5)
Eosinophils Relative: 5 %
HCT: 37.1 % — ABNORMAL LOW (ref 39.0–52.0)
Hemoglobin: 12.2 g/dL — ABNORMAL LOW (ref 13.0–17.0)
Immature Granulocytes: 0 %
Lymphocytes Relative: 60 %
Lymphs Abs: 6.1 K/uL — ABNORMAL HIGH (ref 0.7–4.0)
MCH: 28.9 pg (ref 26.0–34.0)
MCHC: 32.9 g/dL (ref 30.0–36.0)
MCV: 87.9 fL (ref 80.0–100.0)
Monocytes Absolute: 0.8 K/uL (ref 0.1–1.0)
Monocytes Relative: 8 %
Neutro Abs: 2.6 K/uL (ref 1.7–7.7)
Neutrophils Relative %: 26 %
Platelets: 245 K/uL (ref 150–400)
RBC: 4.22 MIL/uL (ref 4.22–5.81)
RDW: 14.4 % (ref 11.5–15.5)
Smear Review: NORMAL
WBC: 10 K/uL (ref 4.0–10.5)
nRBC: 0 % (ref 0.0–0.2)

## 2024-07-27 LAB — HEMOGLOBIN A1C
Hgb A1c MFr Bld: 5.7 % — ABNORMAL HIGH (ref 4.8–5.6)
Mean Plasma Glucose: 116.89 mg/dL

## 2024-07-27 LAB — PREPARE RBC (CROSSMATCH)

## 2024-07-27 NOTE — Progress Notes (Signed)
 PCP - Worth Moloney, PA-C Cardiologist - Denies  PPM/ICD - Denies Device Orders - n/a Rep Notified - n/a  Chest x-ray - 05/16/2024 EKG - 05/16/2024 Stress Test - Denies ECHO - 05/17/2024 Cardiac Cath - Denies  Sleep Study - Denies CPAP - n/a  No DM  Last dose of GLP1 agonist- n/a GLP1 instructions: n/a  Blood Thinner Instructions: n/a Aspirin Instructions: Pt instructed to contact surgeon's office for ASA instructions  ERAS Protcol - Clear liquids until 0600 morning of surgery PRE-SURGERY Ensure or G2- n/a  COVID TEST- n/a   Anesthesia review: No   Patient denies shortness of breath, fever, cough and chest pain at PAT appointment. Pt denies any respiratory illness/infection in the last two months.    All instructions explained to the patient, with a verbal understanding of the material. Patient agrees to go over the instructions while at home for a better understanding. Patient also instructed to self quarantine after being tested for COVID-19. The opportunity to ask questions was provided.

## 2024-07-27 NOTE — Progress Notes (Signed)
 Patient unable to give urine sample at pre-op appointment. UA will need to be collected DOS. Order modified

## 2024-07-27 NOTE — Pre-Procedure Instructions (Signed)
 Surgical Instructions   Your procedure is scheduled on August 02, 2024. Report to [facility] Main Entrance A at 7:00 A.M., then check in with the Admitting office. Any questions or running late day of surgery: call [phone]  Questions prior to your surgery date: call [phone], Monday-Friday, 8am-4pm. If you experience any cold or flu symptoms such as cough, fever, chills, shortness of breath, etc. between now and your scheduled surgery, please notify us  at the above number.     Remember:  Do not eat after midnight the night before your surgery   You may drink clear liquids until 6:00AM the morning of your surgery.   Clear liquids allowed are: Water, Non-Citrus Juices (without pulp), Carbonated Beverages, Clear Tea (no milk, honey, etc.), Black Coffee Only (NO MILK, CREAM OR POWDERED CREAMER of any kind), and Gatorade.    Take these medicines the morning of surgery with A SIP OF WATER:  omeprazole  (PRILOSEC) - may take if needed   Follow your surgeon's instructions regarding stopping Aspirin prior to surgery. If no instructions were given, please contact your surgeon's office.    One week prior to surgery, STOP taking any Aleve, Naproxen, Ibuprofen , Motrin , Advil , Goody's, BC's, all herbal medications, fish oil, and non-prescription vitamins.                     Do NOT Smoke (Tobacco/Vaping) for 24 hours prior to your procedure.  If you use a CPAP at night, you may bring your mask/headgear for your overnight stay.   You will be asked to remove any contacts, glasses, piercing's, hearing aid's, dentures/partials prior to surgery. Please bring cases for these items if needed.    Your surgeon will determine if you are to be admitted or discharged the same day.  Patients discharged the day of surgery will not be allowed to drive home, and someone needs to stay with them for 24 hours.  SURGICAL WAITING ROOM VISITATION Patients may have no more than 2 support people in the  waiting area - these visitors may rotate.   Pre-op nurse will coordinate an appropriate time for 2 ADULT support persons, who may not rotate, to accompany patient in pre-op.  Children under the age of 4 must have an adult with them who is not the patient and must remain in the main waiting area with an adult.  If the patient needs to stay at the hospital during part of their recovery, the visitor guidelines for inpatient rooms apply.  Please refer to the [facility] website for the visitor guidelines for any additional information.   If you received a COVID test during your pre-op visit  it is requested that you wear a mask when out in public, stay away from anyone that may not be feeling well and notify your surgeon if you develop symptoms. If you have been in contact with anyone that has tested positive in the last 10 days please notify you surgeon.      Pre-operative CHG Bathing Instructions   You can play a key role in reducing the risk of infection after surgery. Your skin needs to be as free of germs as possible. You can reduce the number of germs on your skin by washing with CHG (chlorhexidine  gluconate) soap before surgery. CHG is an antiseptic soap that kills germs and continues to kill germs even after washing.   DO NOT use if you have an allergy to chlorhexidine /CHG or antibacterial soaps. If your skin becomes reddened or  irritated, stop using the CHG and notify one of our RNs at [phone].              TAKE A SHOWER THE NIGHT BEFORE SURGERY   Please keep in mind the following:  DO NOT shave, including legs and underarms, 48 hours prior to surgery.   You may shave your face before/day of surgery.  Place clean sheets on your bed the night before surgery Use a clean washcloth (not used since being washed) for shower. DO NOT sleep with pet's night before surgery.  CHG Shower Instructions:  Wash your face and private area with normal soap. If you choose to wash your hair,  wash first with your normal shampoo.  After you use shampoo/soap, rinse your hair and body thoroughly to remove shampoo/soap residue.  Turn the water OFF and apply half the bottle of CHG soap to a CLEAN washcloth.  Apply CHG soap ONLY FROM YOUR NECK DOWN TO YOUR TOES (washing for 3-5 minutes)  DO NOT use CHG soap on face, private areas, open wounds, or sores.  Pay special attention to the area where your surgery is being performed.  If you are having back surgery, having someone wash your back for you may be helpful. Wait 2 minutes after CHG soap is applied, then you may rinse off the CHG soap.  Pat dry with a clean towel  Put on clean pajamas    Additional instructions for the day of surgery: If you choose, you may shower the morning of surgery with an antibacterial soap.  DO NOT APPLY any lotions, deodorants, cologne, or perfumes.   Do not wear jewelry or makeup Do not wear nail polish, gel polish, artificial nails, or any other type of covering on natural nails (fingers and toes) Do not bring valuables to the hospital. [facility] is not responsible for valuables/personal belongings. Put on clean/comfortable clothes.  Please brush your teeth.  Ask your nurse before applying any prescription medications to the skin.

## 2024-07-31 ENCOUNTER — Ambulatory Visit

## 2024-08-02 ENCOUNTER — Encounter (HOSPITAL_COMMUNITY): Payer: Self-pay

## 2024-08-02 ENCOUNTER — Encounter (HOSPITAL_COMMUNITY)
Admission: RE | Disposition: A | Discharge status: Home / Self Care | Payer: Self-pay | Source: Home / Self Care | Attending: General Surgery

## 2024-08-02 ENCOUNTER — Inpatient Hospital Stay (HOSPITAL_COMMUNITY): Payer: Self-pay

## 2024-08-02 ENCOUNTER — Inpatient Hospital Stay (HOSPITAL_COMMUNITY)
Admission: RE | Admit: 2024-08-02 | Discharge: 2024-08-10 | DRG: 406 | Disposition: A | Discharge status: Home / Self Care | Attending: General Surgery | Admitting: General Surgery

## 2024-08-02 DIAGNOSIS — Z79899 Other long term (current) drug therapy: Secondary | ICD-10-CM

## 2024-08-02 DIAGNOSIS — E440 Moderate protein-calorie malnutrition: Secondary | ICD-10-CM | POA: Diagnosis present

## 2024-08-02 DIAGNOSIS — Z6824 Body mass index (BMI) 24.0-24.9, adult: Secondary | ICD-10-CM

## 2024-08-02 DIAGNOSIS — C221 Intrahepatic bile duct carcinoma: Principal | ICD-10-CM | POA: Diagnosis present

## 2024-08-02 DIAGNOSIS — Z7982 Long term (current) use of aspirin: Secondary | ICD-10-CM

## 2024-08-02 DIAGNOSIS — K9081 Whipple's disease: Principal | ICD-10-CM | POA: Diagnosis present

## 2024-08-02 DIAGNOSIS — Z87891 Personal history of nicotine dependence: Secondary | ICD-10-CM

## 2024-08-02 DIAGNOSIS — M25522 Pain in left elbow: Secondary | ICD-10-CM | POA: Diagnosis present

## 2024-08-02 DIAGNOSIS — C240 Malignant neoplasm of extrahepatic bile duct: Secondary | ICD-10-CM | POA: Diagnosis present

## 2024-08-02 DIAGNOSIS — I10 Essential (primary) hypertension: Secondary | ICD-10-CM | POA: Diagnosis present

## 2024-08-02 DIAGNOSIS — E876 Hypokalemia: Secondary | ICD-10-CM | POA: Diagnosis present

## 2024-08-02 DIAGNOSIS — Z885 Allergy status to narcotic agent status: Secondary | ICD-10-CM

## 2024-08-02 LAB — MRSA NEXT GEN BY PCR, NASAL: MRSA by PCR Next Gen: NOT DETECTED

## 2024-08-02 LAB — URINALYSIS, ROUTINE W REFLEX MICROSCOPIC
Bilirubin Urine: NEGATIVE
Glucose, UA: NEGATIVE mg/dL
Hgb urine dipstick: NEGATIVE
Ketones, ur: NEGATIVE mg/dL
Leukocytes,Ua: NEGATIVE
Nitrite: NEGATIVE
Protein, ur: NEGATIVE mg/dL
Specific Gravity, Urine: 1.019 (ref 1.005–1.030)
pH: 5 (ref 5.0–8.0)

## 2024-08-02 LAB — POCT I-STAT 7, (LYTES, BLD GAS, ICA,H+H)
Acid-base deficit: 3 mmol/L — ABNORMAL HIGH (ref 0.0–2.0)
Acid-base deficit: 2 mmol/L (ref 0.0–2.0)
Acid-base deficit: 4 mmol/L — ABNORMAL HIGH (ref 0.0–2.0)
Bicarbonate: 22.9 mmol/L (ref 20.0–28.0)
Bicarbonate: 24.2 mmol/L (ref 20.0–28.0)
Bicarbonate: 22.6 mmol/L (ref 20.0–28.0)
Calcium, Ion: 1.26 mmol/L (ref 1.15–1.40)
Calcium, Ion: 1.2 mmol/L (ref 1.15–1.40)
Calcium, Ion: 1.21 mmol/L (ref 1.15–1.40)
HCT: 30 % — ABNORMAL LOW (ref 39.0–52.0)
HCT: 30 % — ABNORMAL LOW (ref 39.0–52.0)
HCT: 32 % — ABNORMAL LOW (ref 39.0–52.0)
Hemoglobin: 10.2 g/dL — ABNORMAL LOW (ref 13.0–17.0)
Hemoglobin: 10.2 g/dL — ABNORMAL LOW (ref 13.0–17.0)
Hemoglobin: 10.9 g/dL — ABNORMAL LOW (ref 13.0–17.0)
O2 Saturation: 100 %
O2 Saturation: 100 %
O2 Saturation: 100 %
Patient temperature: 35.6
Patient temperature: 35.5
Patient temperature: 35.9
Potassium: 3.1 mmol/L — ABNORMAL LOW (ref 3.5–5.1)
Potassium: 3.5 mmol/L (ref 3.5–5.1)
Potassium: 3.8 mmol/L (ref 3.5–5.1)
Sodium: 143 mmol/L (ref 135–145)
Sodium: 144 mmol/L (ref 135–145)
Sodium: 144 mmol/L (ref 135–145)
TCO2: 24 mmol/L (ref 22–32)
TCO2: 26 mmol/L (ref 22–32)
TCO2: 24 mmol/L (ref 22–32)
pCO2 arterial: 43.3 mmHg (ref 32–48)
pCO2 arterial: 44.4 mmHg (ref 32–48)
pCO2 arterial: 43 mmHg (ref 32–48)
pH, Arterial: 7.325 — ABNORMAL LOW (ref 7.35–7.45)
pH, Arterial: 7.338 — ABNORMAL LOW (ref 7.35–7.45)
pH, Arterial: 7.324 — ABNORMAL LOW (ref 7.35–7.45)
pO2, Arterial: 226 mmHg — ABNORMAL HIGH (ref 83–108)
pO2, Arterial: 193 mmHg — ABNORMAL HIGH (ref 83–108)
pO2, Arterial: 228 mmHg — ABNORMAL HIGH (ref 83–108)

## 2024-08-02 LAB — ABO/RH: ABO/RH(D): O POS

## 2024-08-02 MED ORDER — PROPOFOL 10 MG/ML IV BOLUS
INTRAVENOUS | Status: AC
  Filled 2024-08-02: qty 20

## 2024-08-02 MED ORDER — PROPOFOL 10 MG/ML IV BOLUS
INTRAVENOUS | Status: DC | PRN
  Administered 2024-08-02: 80 mg via INTRAVENOUS
  Administered 2024-08-02: 100 mg via INTRAVENOUS

## 2024-08-02 MED ORDER — ACETAMINOPHEN 500 MG PO TABS
1000.0000 mg | ORAL_TABLET | ORAL | Status: AC
  Administered 2024-08-02: 1000 mg via ORAL
  Filled 2024-08-02: qty 2

## 2024-08-02 MED ORDER — CEFAZOLIN SODIUM-DEXTROSE 2-4 GM/100ML-% IV SOLN
2.0000 g | INTRAVENOUS | Status: AC
  Administered 2024-08-02: 2 g via INTRAVENOUS
  Filled 2024-08-02: qty 100

## 2024-08-02 MED ORDER — LACTATED RINGERS IV SOLN: INTRAVENOUS | Status: DC | PRN

## 2024-08-02 MED ORDER — PHENYLEPHRINE HCL (PRESSORS) 10 MG/ML IV SOLN
INTRAVENOUS | Status: AC
  Filled 2024-08-02: qty 1

## 2024-08-02 MED ORDER — METHOCARBAMOL 1000 MG/10ML IJ SOLN
500.0000 mg | Freq: Three times a day (TID) | INTRAMUSCULAR | Status: DC | PRN
  Administered 2024-08-02 – 2024-08-04 (×4): 500 mg via INTRAVENOUS
  Filled 2024-08-02 (×4): qty 10

## 2024-08-02 MED ORDER — PROCHLORPERAZINE EDISYLATE 10 MG/2ML IJ SOLN
5.0000 mg | Freq: Four times a day (QID) | INTRAMUSCULAR | Status: DC | PRN
  Administered 2024-08-07: 5 mg via INTRAVENOUS
  Filled 2024-08-02: qty 2

## 2024-08-02 MED ORDER — ORAL CARE MOUTH RINSE: 15.0000 mL | Freq: Once | OROMUCOSAL | Status: AC

## 2024-08-02 MED ORDER — CHLORHEXIDINE GLUCONATE 0.12 % MT SOLN
15.0000 mL | Freq: Once | OROMUCOSAL | Status: AC
  Administered 2024-08-02: 15 mL via OROMUCOSAL
  Filled 2024-08-02: qty 15

## 2024-08-02 MED ORDER — 0.9 % SODIUM CHLORIDE (POUR BTL) OPTIME
TOPICAL | Status: DC | PRN
  Administered 2024-08-02: 3000 mL

## 2024-08-02 MED ORDER — SODIUM CHLORIDE 0.9 % IV SOLN: INTRAVENOUS | Status: DC | PRN

## 2024-08-02 MED ORDER — PROCHLORPERAZINE MALEATE 10 MG PO TABS: 10.0000 mg | ORAL_TABLET | Freq: Four times a day (QID) | ORAL | Status: DC | PRN

## 2024-08-02 MED ORDER — SUGAMMADEX SODIUM 200 MG/2ML IV SOLN
INTRAVENOUS | Status: DC | PRN
  Administered 2024-08-02: 200 mg via INTRAVENOUS

## 2024-08-02 MED ORDER — LIDOCAINE 2% (20 MG/ML) 5 ML SYRINGE
INTRAMUSCULAR | Status: DC | PRN
  Administered 2024-08-02: 100 mg via INTRAVENOUS

## 2024-08-02 MED ORDER — ROPIVACAINE HCL 2 MG/ML IJ SOLN
INTRAMUSCULAR | Status: DC | PRN
  Administered 2024-08-02: 4 mL via EPIDURAL
  Administered 2024-08-02: 5 mL/h via EPIDURAL
  Administered 2024-08-02: 2 mL via EPIDURAL

## 2024-08-02 MED ORDER — ONDANSETRON HCL 4 MG/2ML IJ SOLN: 4.0000 mg | Freq: Four times a day (QID) | INTRAMUSCULAR | Status: DC | PRN

## 2024-08-02 MED ORDER — ONDANSETRON HCL 4 MG/2ML IJ SOLN
INTRAMUSCULAR | Status: DC | PRN
  Administered 2024-08-02: 4 mg via INTRAVENOUS

## 2024-08-02 MED ORDER — VISTASEAL 10 ML SINGLE DOSE KIT
PACK | CUTANEOUS | Status: DC | PRN
  Administered 2024-08-02: 10 mL via TOPICAL

## 2024-08-02 MED ORDER — HEPARIN SODIUM (PORCINE) 5000 UNIT/ML IJ SOLN
5000.0000 [IU] | Freq: Three times a day (TID) | INTRAMUSCULAR | Status: DC
  Administered 2024-08-03 – 2024-08-04 (×4): 5000 [IU] via SUBCUTANEOUS
  Filled 2024-08-02 (×4): qty 1

## 2024-08-02 MED ORDER — PHENYLEPHRINE 80 MCG/ML (10ML) SYRINGE FOR IV PUSH (FOR BLOOD PRESSURE SUPPORT)
PREFILLED_SYRINGE | INTRAVENOUS | Status: DC | PRN
  Administered 2024-08-02: 120 ug via INTRAVENOUS
  Administered 2024-08-02: 80 ug via INTRAVENOUS
  Administered 2024-08-02: 120 ug via INTRAVENOUS
  Administered 2024-08-02: 80 ug via INTRAVENOUS
  Administered 2024-08-02: 120 ug via INTRAVENOUS
  Administered 2024-08-02 (×2): 80 ug via INTRAVENOUS

## 2024-08-02 MED ORDER — KCL IN DEXTROSE-NACL 20-5-0.45 MEQ/L-%-% IV SOLN
INTRAVENOUS | Status: AC
  Filled 2024-08-02 (×3): qty 1000

## 2024-08-02 MED ORDER — CHLORHEXIDINE GLUCONATE CLOTH 2 % EX PADS
6.0000 | MEDICATED_PAD | Freq: Every day | CUTANEOUS | Status: DC
  Administered 2024-08-02 – 2024-08-06 (×4): 6 via TOPICAL

## 2024-08-02 MED ORDER — PHENYLEPHRINE 80 MCG/ML (10ML) SYRINGE FOR IV PUSH (FOR BLOOD PRESSURE SUPPORT)
PREFILLED_SYRINGE | INTRAVENOUS | Status: AC
  Filled 2024-08-02: qty 10

## 2024-08-02 MED ORDER — ONDANSETRON HCL 4 MG/2ML IJ SOLN
INTRAMUSCULAR | Status: AC
  Filled 2024-08-02: qty 2

## 2024-08-02 MED ORDER — ONDANSETRON HCL 4 MG/2ML IJ SOLN: 4.0000 mg | Freq: Once | INTRAMUSCULAR | Status: DC | PRN

## 2024-08-02 MED ORDER — PHENYLEPHRINE HCL-NACL 20-0.9 MG/250ML-% IV SOLN
INTRAVENOUS | Status: DC | PRN
  Administered 2024-08-02: 50 ug/min via INTRAVENOUS

## 2024-08-02 MED ORDER — DEXAMETHASONE SOD PHOSPHATE PF 10 MG/ML IJ SOLN
INTRAMUSCULAR | Status: DC | PRN
  Administered 2024-08-02: 10 mg via INTRAVENOUS

## 2024-08-02 MED ORDER — LIDOCAINE HCL 1 % IJ SOLN
INTRAMUSCULAR | Status: DC | PRN
  Administered 2024-08-02: 5 mL via INTRAMUSCULAR

## 2024-08-02 MED ORDER — ACETAMINOPHEN 10 MG/ML IV SOLN
1000.0000 mg | Freq: Four times a day (QID) | INTRAVENOUS | Status: DC
  Administered 2024-08-02 – 2024-08-03 (×3): 1000 mg via INTRAVENOUS
  Filled 2024-08-02 (×3): qty 100

## 2024-08-02 MED ORDER — FENTANYL 50 MCG/ML IV PCA SOLN
INTRAVENOUS | Status: DC
  Administered 2024-08-02: 60 ug via INTRAVENOUS
  Administered 2024-08-03: 40 ug via INTRAVENOUS
  Administered 2024-08-03: 70 ug via INTRAVENOUS
  Administered 2024-08-03: 90 ug via INTRAVENOUS
  Administered 2024-08-03: 50 ug via INTRAVENOUS
  Administered 2024-08-03: 60 ug via INTRAVENOUS
  Administered 2024-08-04: 20 ug via INTRAVENOUS
  Administered 2024-08-04: 60 ug via INTRAVENOUS
  Administered 2024-08-04: 120 ug via INTRAVENOUS
  Administered 2024-08-04: 90 ug via INTRAVENOUS
  Administered 2024-08-04: 30 ug via INTRAVENOUS
  Filled 2024-08-02 (×2): qty 25

## 2024-08-02 MED ORDER — FENTANYL CITRATE (PF) 100 MCG/2ML IJ SOLN: 25.0000 ug | INTRAMUSCULAR | Status: DC | PRN

## 2024-08-02 MED ORDER — ROPIVACAINE HCL 2 MG/ML IJ SOLN
5.0000 mL/h | INTRAMUSCULAR | Status: DC
  Administered 2024-08-02 – 2024-08-03 (×2): 5 mL/h via EPIDURAL
  Filled 2024-08-02 (×4): qty 200

## 2024-08-02 MED ORDER — FENTANYL CITRATE (PF) 100 MCG/2ML IJ SOLN
INTRAMUSCULAR | Status: AC
  Filled 2024-08-02: qty 2

## 2024-08-02 MED ORDER — SODIUM CHLORIDE (PF) 0.9 % IJ SOLN
INTRAMUSCULAR | Status: AC
  Filled 2024-08-02: qty 10

## 2024-08-02 MED ORDER — FENTANYL CITRATE (PF) 250 MCG/5ML IJ SOLN
INTRAMUSCULAR | Status: DC | PRN
  Administered 2024-08-02 (×4): 50 ug via INTRAVENOUS
  Administered 2024-08-02: 100 ug via INTRAVENOUS

## 2024-08-02 MED ORDER — CEFAZOLIN SODIUM-DEXTROSE 2-3 GM-%(50ML) IV SOLR
INTRAVENOUS | Status: DC | PRN
  Administered 2024-08-02 (×2): 2 g via INTRAVENOUS

## 2024-08-02 MED ORDER — LIDOCAINE 2% (20 MG/ML) 5 ML SYRINGE
INTRAMUSCULAR | Status: AC
  Filled 2024-08-02: qty 5

## 2024-08-02 MED ORDER — DIPHENHYDRAMINE HCL 50 MG/ML IJ SOLN
12.5000 mg | Freq: Four times a day (QID) | INTRAMUSCULAR | Status: DC | PRN
  Administered 2024-08-05 – 2024-08-07 (×2): 12.5 mg via INTRAVENOUS
  Filled 2024-08-02 (×2): qty 1

## 2024-08-02 MED ORDER — WATER FOR IRRIGATION, STERILE IR SOLN
Status: DC | PRN
  Administered 2024-08-02: 1000 mL

## 2024-08-02 MED ORDER — ROCURONIUM BROMIDE 10 MG/ML (PF) SYRINGE
PREFILLED_SYRINGE | INTRAVENOUS | Status: DC | PRN
  Administered 2024-08-02 (×2): 20 mg via INTRAVENOUS
  Administered 2024-08-02 (×2): 10 mg via INTRAVENOUS
  Administered 2024-08-02 (×2): 20 mg via INTRAVENOUS
  Administered 2024-08-02: 80 mg via INTRAVENOUS

## 2024-08-02 MED ORDER — DEXAMETHASONE SOD PHOSPHATE PF 10 MG/ML IJ SOLN
INTRAMUSCULAR | Status: AC
  Filled 2024-08-02: qty 2

## 2024-08-02 MED ORDER — ALBUMIN HUMAN 5 % IV SOLN: INTRAVENOUS | Status: DC | PRN

## 2024-08-02 MED ORDER — ROCURONIUM BROMIDE 10 MG/ML (PF) SYRINGE
PREFILLED_SYRINGE | INTRAVENOUS | Status: AC
  Filled 2024-08-02: qty 10

## 2024-08-02 MED ORDER — NALOXONE HCL 0.4 MG/ML IJ SOLN: 0.4000 mg | INTRAMUSCULAR | Status: DC | PRN

## 2024-08-02 MED ORDER — BUPIVACAINE-EPINEPHRINE (PF) 0.25% -1:200000 IJ SOLN
INTRAMUSCULAR | Status: AC
  Filled 2024-08-02: qty 30

## 2024-08-02 MED ORDER — CHLORHEXIDINE GLUCONATE CLOTH 2 % EX PADS: 6.0000 | MEDICATED_PAD | Freq: Once | CUTANEOUS | Status: DC

## 2024-08-02 MED ORDER — LACTATED RINGERS IV SOLN: INTRAVENOUS | Status: DC

## 2024-08-02 MED ORDER — LATANOPROST 0.005 % OP SOLN
1.0000 [drp] | Freq: Every day | OPHTHALMIC | Status: DC
  Administered 2024-08-02 – 2024-08-09 (×7): 1 [drp] via OPHTHALMIC
  Filled 2024-08-02 (×2): qty 2.5

## 2024-08-02 MED ORDER — DIPHENHYDRAMINE HCL 12.5 MG/5ML PO ELIX
12.5000 mg | ORAL_SOLUTION | Freq: Four times a day (QID) | ORAL | Status: DC | PRN
  Administered 2024-08-09 (×2): 12.5 mg
  Filled 2024-08-02 (×2): qty 5

## 2024-08-02 MED ORDER — CHLORHEXIDINE GLUCONATE CLOTH 2 % EX PADS
6.0000 | MEDICATED_PAD | Freq: Once | CUTANEOUS | Status: AC
  Administered 2024-08-02: 6 via TOPICAL

## 2024-08-02 MED ORDER — CEFAZOLIN SODIUM-DEXTROSE 2-4 GM/100ML-% IV SOLN
2.0000 g | Freq: Three times a day (TID) | INTRAVENOUS | Status: AC
  Administered 2024-08-02: 2 g via INTRAVENOUS
  Filled 2024-08-02: qty 100

## 2024-08-02 MED ORDER — PANTOPRAZOLE SODIUM 40 MG IV SOLR
40.0000 mg | Freq: Every day | INTRAVENOUS | Status: DC
  Administered 2024-08-02 – 2024-08-05 (×4): 40 mg via INTRAVENOUS
  Filled 2024-08-02 (×4): qty 10

## 2024-08-02 MED ORDER — SODIUM CHLORIDE 0.9% FLUSH: 9.0000 mL | INTRAVENOUS | Status: DC | PRN

## 2024-08-02 MED ORDER — HYDRALAZINE HCL 20 MG/ML IJ SOLN: 10.0000 mg | INTRAMUSCULAR | Status: DC | PRN

## 2024-08-02 MED ORDER — ACETAMINOPHEN 10 MG/ML IV SOLN: 1000.0000 mg | Freq: Once | INTRAVENOUS | Status: DC | PRN

## 2024-08-02 MED ORDER — LIDOCAINE HCL (PF) 1 % IJ SOLN
INTRAMUSCULAR | Status: AC
  Filled 2024-08-02: qty 30

## 2024-08-02 NOTE — Anesthesia Preprocedure Evaluation (Addendum)
"                                    Anesthesia Evaluation  Patient identified by MRN, date of birth, ID band Patient awake    Reviewed: Allergy & Precautions, NPO status , Patient's Chart, lab work & pertinent test results, reviewed documented beta blocker date and time   History of Anesthesia Complications Negative for: history of anesthetic complications  Airway Mallampati: II  TM Distance: >3 FB     Dental  (+) Loose, Missing,    Pulmonary neg COPD, former smoker, neg PE   breath sounds clear to auscultation       Cardiovascular hypertension, (-) CAD, (-) Past MI, (-) Cardiac Stents and (-) CABG  Rhythm:Regular Rate:Normal     Neuro/Psych neg Seizures    GI/Hepatic ,neg GERD  ,,(+) Hepatitis -, Ccholangiocarcinoma   Endo/Other    Renal/GU Renal disease     Musculoskeletal   Abdominal   Peds  Hematology  (+) Blood dyscrasia, anemia   Anesthesia Other Findings   Reproductive/Obstetrics                              Anesthesia Physical Anesthesia Plan  ASA: 3  Anesthesia Plan: General   Post-op Pain Management: Epidural*   Induction:   PONV Risk Score and Plan: 2 and Ondansetron   Airway Management Planned: Oral ETT  Additional Equipment: Arterial line  Intra-op Plan:   Post-operative Plan: Extubation in OR and Possible Post-op intubation/ventilation  Informed Consent: I have reviewed the patients History and Physical, chart, labs and discussed the procedure including the risks, benefits and alternatives for the proposed anesthesia with the patient or authorized representative who has indicated his/her understanding and acceptance.     Dental advisory given  Plan Discussed with:   Anesthesia Plan Comments:          Anesthesia Quick Evaluation  "

## 2024-08-02 NOTE — Anesthesia Procedure Notes (Signed)
 Procedure Name: MAC Date/Time: 08/02/2024 9:49 AM  Performed by: Cetrone, Domenic, RNPre-anesthesia Checklist: Patient identified, Emergency Drugs available, Suction available and Patient being monitored Patient Re-evaluated:Patient Re-evaluated prior to induction Oxygen Delivery Method: Circle System Utilized Preoxygenation: Pre-oxygenation with 100% oxygen Induction Type: IV induction Ventilation: Mask ventilation without difficulty Laryngoscope Size: Mac and 4 Grade View: Grade I Tube type: Oral Tube size: 7.5 mm Number of attempts: 1 Airway Equipment and Method: Stylet and Oral airway Placement Confirmation: ETT inserted through vocal cords under direct vision, positive ETCO2 and breath sounds checked- equal and bilateral Secured at: 23 cm Tube secured with: Tape Dental Injury: Teeth and Oropharynx as per pre-operative assessment

## 2024-08-02 NOTE — Anesthesia Procedure Notes (Signed)
 Arterial Line Insertion Start/End1/22/2026 8:30 AM, 08/02/2024 8:45 AM Performed by: Keneth Lynwood POUR, MD, CRNA  Patient location: Pre-op. Preanesthetic checklist: patient identified, IV checked, site marked, risks and benefits discussed, surgical consent, monitors and equipment checked, pre-op evaluation, timeout performed and anesthesia consent Lidocaine  1% used for infiltration radial was placed Catheter size: 20 G Hand hygiene performed  and maximum sterile barriers used   Attempts: 2 Procedure performed using ultrasound to evaluate access site. Ultrasound Notes:relevant anatomy identified, ultrasound used to visualize needle entry and vessel patent under ultrasound. Following insertion, dressing applied. Post procedure assessment: normal and unchanged  Patient tolerated the procedure well with no immediate complications.

## 2024-08-02 NOTE — Transfer of Care (Signed)
 Immediate Anesthesia Transfer of Care Note  Patient: Andrew Douglas  Procedure(s) Performed: WHIPPLE PROCEDURE (Abdomen) LAPAROSCOPY, DIAGNOSTIC (Abdomen)  Patient Location: PACU  Anesthesia Type:General and Epidural  Level of Consciousness: awake and drowsy  Airway & Oxygen Therapy: Patient Spontanous Breathing  Post-op Assessment: Report given to RN  Post vital signs: Reviewed and stable  Last Vitals:  Vitals Value Taken Time  BP 101/67 08/02/24 16:45  Temp    Pulse 77 08/02/24 16:48  Resp 12 08/02/24 16:48  SpO2 94 % 08/02/24 16:48  Vitals shown include unfiled device data.  Last Pain:  Vitals:   08/02/24 0827  TempSrc:   PainSc: 0-No pain         Complications: No notable events documented.

## 2024-08-02 NOTE — H&P (Signed)
 Andrew Douglas is an 79 y.o. male.   Chief Complaint: distal cholangiocarcinoma HPI:  Pt presented with acute cholecystitis last march.  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  This was associated with weight loss.  He has been working on functional status and activity.  He is doing better and is ready for whipple.    Past Medical History:  Diagnosis Date   Cancer (HCC)    Bile Duct Cancer   Hepatitis C    Completed Treatment   Hypertension     Past Surgical History:  Procedure Laterality Date   BACK SURGERY     a little back problem 20 or 25 years ago   BILIARY DILATION  10/04/2023   Procedure: DILATION, STRICTURE, BILE DUCT;  Surgeon: [name] Kitchens, MD;  Location: [facility];  Service: Gastroenterology;;   CHOLECYSTECTOMY N/A 10/03/2023   Procedure: LAPAROSCOPIC CHOLECYSTECTOMY WITH ICG DYE AND  INTRAOPERATIVE CHOLANGIOGRAM;  Surgeon: Vanderbilt Ned, MD;  Location: [facility];  Service: General;  Laterality: N/A;  ICG   COLONOSCOPY     ERCP N/A 10/04/2023   Procedure: ERCP, WITH INTERVENTION IF INDICATED;  Surgeon: [name] Kitchens, MD;  Location: [facility];  Service: Gastroenterology;  Laterality: N/A;   ERCP N/A 11/01/2023   Procedure: ERCP, WITH INTERVENTION IF INDICATED;  Surgeon: [name] Kitchens, MD;  Location: [facility];  Service: Gastroenterology;  Laterality: N/A;   ERCP N/A 04/03/2024   Procedure: ERCP, WITH INTERVENTION IF INDICATED;  Surgeon: [name] Kitchens, MD;  Location: [facility];  Service: Gastroenterology;  Laterality: N/A;  stent removal   ERCP N/A 05/20/2024   Procedure: ERCP, WITH INTERVENTION IF INDICATED;  Surgeon: Avram Lupita BRAVO, MD;  Location: [facility];  Service: Gastroenterology;  Laterality: N/A;   ESOPHAGOGASTRODUODENOSCOPY N/A 10/02/2023   Procedure: EGD (ESOPHAGOGASTRODUODENOSCOPY);  Surgeon: Avram Lupita BRAVO, MD;  Location: [facility];  Service: Gastroenterology;  Laterality: N/A;   SPHINCTEROTOMY   10/04/2023   Procedure: SPHINCTEROTOMY, BILIARY;  Surgeon: [name] Kitchens, MD;  Location: [facility];  Service: Gastroenterology;;    No family history on file. Social History:  reports that he has quit smoking. His smoking use included cigarettes. He has been exposed to tobacco smoke. He has never used smokeless tobacco. He reports that he does not currently use alcohol. He reports that he does not currently use drugs.  Allergies: Allergies[1]  Medications Prior to Admission  Medication Sig Dispense Refill   ascorbic acid (VITAMIN C) 500 MG tablet Take 500 mg by mouth daily.     aspirin EC 81 MG tablet Take 81 mg by mouth daily. Swallow whole.     bimatoprost (LUMIGAN) 0.03 % ophthalmic solution Place 1 drop into both eyes at bedtime.     losartan-hydrochlorothiazide (HYZAAR) 100-12.5 MG tablet Take 1 tablet by mouth daily.     Multiple Vitamin (MULTIVITAMIN WITH MINERALS) TABS tablet Take 1 tablet by mouth daily.     omeprazole  (PRILOSEC) 40 MG capsule Take 40 mg by mouth daily as needed (acid reflux).     potassium chloride  (KLOR-CON ) 10 MEQ tablet Take 10 mEq by mouth 2 (two) times daily.     polyethylene glycol powder (GLYCOLAX /MIRALAX ) 17 GM/SCOOP powder Take 17 g by mouth daily as needed for mild constipation or moderate constipation. Mix in 4-8 ounces of liquid as directed. (Patient not taking: Reported on 07/24/2024) 238 g 0    Results for orders placed or performed during the hospital encounter of 08/02/24 (from the past 48 hours)  Urinalysis, Routine w reflex microscopic -Urine, Clean Catch     Status: None   Collection Time: 08/02/24  8:00 AM  Result Value Ref Range   Color, Urine YELLOW YELLOW   APPearance CLEAR CLEAR   Specific Gravity, Urine 1.019 1.005 - 1.030   pH 5.0 5.0 - 8.0   Glucose, UA NEGATIVE NEGATIVE mg/dL   Hgb urine dipstick NEGATIVE NEGATIVE   Bilirubin Urine NEGATIVE NEGATIVE   Ketones, ur NEGATIVE NEGATIVE mg/dL   Protein, ur NEGATIVE NEGATIVE mg/dL    Nitrite NEGATIVE NEGATIVE   Leukocytes,Ua NEGATIVE NEGATIVE    Comment: Performed at [facility], [address], [place], [place] [address]  ABO/Rh     Status: None (Preliminary result)   Collection Time: 08/02/24  8:08 AM  Result Value Ref Range   ABO/RH(D) PENDING    No results found.  Review of Systems  All other systems reviewed and are negative.   Blood pressure (!) 151/89, pulse 97, temperature 98 F (36.7 C), temperature source Oral, resp. rate 18, height 6' (1.829 m), weight 81.6 kg, SpO2 95%. Physical Exam Vitals reviewed.  Constitutional:      General: He is not in acute distress.    Appearance: Normal appearance. He is not ill-appearing.  HENT:     Right Ear: External ear normal.     Left Ear: External ear normal.  Eyes:     General: No scleral icterus.       Right eye: No discharge.        Left eye: No discharge.     Pupils: Pupils are equal, round, and reactive to light.  Cardiovascular:     Rate and Rhythm: Normal rate and regular rhythm.     Pulses: Normal pulses.  Pulmonary:     Effort: Pulmonary effort is normal. No respiratory distress.     Breath sounds: Normal breath sounds.  Chest:     Chest wall: No tenderness.  Abdominal:     General: Abdomen is flat. There is no distension.     Palpations: Abdomen is soft. There is no mass.     Tenderness: There is no abdominal tenderness. There is no guarding.     Hernia: No hernia is present.  Musculoskeletal:        General: No deformity or signs of injury. Normal range of motion.     Cervical back: Neck supple.  Skin:    General: Skin is warm and dry.     Capillary Refill: Capillary refill takes 2 to 3 seconds.     Findings: No bruising or erythema.  Neurological:     General: No focal deficit present.     Mental Status: He is alert and oriented to person, place, and time.     Motor: No weakness.     Coordination: Coordination normal.  Psychiatric:        Mood and Affect: Mood normal.         Behavior: Behavior normal.        Thought Content: Thought content normal.        Judgment: Judgment normal.      Assessment/Plan Distal cholangiocarcinoma Plan diagnostic laparoscopy and whipple if no evidence of distant disease.  Questions answered. Risks reviewed.      Jina LITTIE Nephew, MD, FACS, FSSO Surgical Oncology, General Surgery, Trauma and Critical [facility], [place] [phone] for weekday/non holidays Check [email or web address] for coverage night/weekend/holidays under General Surgery     08/02/2024, 8:45 AM       [  1]  Allergies Allergen Reactions   Oxycontin  [Oxycodone ]     Crazy dreams

## 2024-08-02 NOTE — Op Note (Addendum)
 PREOPERATIVE DIAGNOSIS: distal cholangiocarcinoma  POSTOPERATIVE DIAGNOSIS: Same.   PROCEDURES PERFORMED:  Diagnostic laparoscopy Small bowel resection Classic pancreaticoduodenectomy  Placement of pancreatic duct stent   SURGEON: Jina Nephew, MD   ASSISTANT: Bernarda Ned, MD   ANESTHESIA: General and epidural   FINDINGS: 1-2 cm periampullary mass. soft pancreatic tissue. 12 mm common bile duct. 4 mm pancreatic duct. Several duodenal diverticula were seen and resected.  NGT confirmed to be in through the gastric anastamosis.    SPECIMENS:  Liver surface biopsies 2. Pancreaticoduodenectomy  3. Portal lymph nodes  4. Segment of proximal jejunum with diverticuli  ESTIMATED BLOOD LOSS: 200 mL.   COMPLICATIONS: None known.   PROCEDURE:   Pt was identified in the holding area and taken to  the operating room, and placed supine on the operating room  table. General anesthesia was induced. The patient's abdomen was  prepped and draped in a sterile fashion, after a Foley catheter was  placed. A time-out was performed according to the surgical safety check  list. When all was correct we continued.   The patient was placed in reverse trendelenburg position and rotated to the right.  The left subcostal margin was anesthetized with local anesthesia.  A 5 mm optiview trocar was placed under direct visualization.  The abdomen was insufflated with carbon dioxide.  The abdomen was examined.  A second port was placed in the upper midline to be able to better visualize the right liver.  Several small liver surface nodules were seen. These were biopsied and sent for frozen section.  Hemostasis was achieved with hook cautery. A third port was placed in the RUQ at a future drain site.  Some of the adhesions to the liver edge were taken down laparoscopically.    The frozen sections came back negative.  A midline incision was made from the xiphoid to just above the umbilicus. The subcutaneous  tissues were divided with the Bovie cautery. The peritoneum was entered in the center of the abdomen. Digital retraction was then used to elevate the preperitoneal fat, and this was taken with the cautery as well. Care was taken to protect the underlying viscera.   The Bookwalter self-retaining retractor was placed to assist visualization. The right colon was taken down off of the white line of Toldt and from the retroperitoneum at the hepatic flexure. The porta was identified. The duodenum was kocherized extensively with blunt dissection and with cautery.  The common bile duct was skeletonized near the duodenum. A vessel loop was passed around it. The gastroduodenal artery, as well as the common hepatic artery were skeletonized. The GDA was deeper than normal but was able to be traced out and isolated from the surrounding tissue. The proper hepatic artery was traced out to make sure that flow was going to both sides of the liver when the GDA was clamped. The GDA was test clamped with the bulldog, with good flow to the liver and no signs of ischemia. This was divided with 2-0 silk ties and then clipped. The proximal GDA was also suture ligated with 3-0 prolene.  The proper hepatic artery was reflected upward, and the anterior portal vein was exposed. A Kelly clamp was passed underneath the pancreas at the superior mesenteric vein, and this passed  easily with no signs of tumor involvement.   Attention was then directed to the stomach, and the omentum was taken  off of the stomach at the border of the antrum and the body. The  gastrohepatic ligament  was taken down with the harmonic, and care was  taken to make sure there was not a replaced left hepatic artery in this  location. The stomach was divided with the GIA-75 stapler. The border  of the stomach was inverted with a 3-0 running PDS suture.   Attention was then directed to the small bowel. Around 10 cm past the  ligament of Treitz was located, and  this was divided with the 75-GIA.  The distal portion of the jejunum was also inverted with a 3-0 PDS  suture. The fourth portion of the duodenum was skeletonized with the  harmonic scalpel, taking down all of the mesenteric vessels. The  ligament of Treitz was taken down. The IMV was preserved.  The duodenum was then passed underneath the portal vein. Several duodenal diverticuli were encountered.    2-0 silk sutures were tied down and the inferior and superior border of the pancreas. At this point the Burnard was replaced and the pancreas was divided with the cautery. Inferiorly, there was bleeding from the inferior pancreaticoduodenal artery and this required suture ligation.  The Bovie was used to coagulate the small bleeders at the border of the pancreas.  The Overholt in combination with the harmonic and locking Weck clips  were then used to take the head and uncinate process of pancreas off of the portal vein and  the superior mesenteric artery. Care was taken not to incorporate the  superior mesenteric artery in the dissection. The specimen was then marked and passed off the table for frozen section margin.   The jejunum was then passed through a defect in the transverse colon mesentery in order to get appropriate lie for the pancreatic and biliary anastomoses. It was apparent that there were several more small bowel diverticuli.  The small bowel was run and there were two more small bowel diverticuli  in the proximal jejunum.  These were only about 15 cm apart and were in the proximal segment so additional 15-20 cm of small bowel was divided with the stapler and the harmonic.  The end of the small bowel was then inverted with 3-0 PDS.  This was then placed through the transverse colon mesenteric defect.  The mesenteric defect was closed down with interrupted 3-0 silk sutures.    The more distal portion of the jejunum was pulled up over the colon, and two 3-0 silks were placed through the  posterior border  of the stomach for the gastrojejunostomy. The stomach and the small  bowel were opened, and a GIA-75 was used to create an end-to-end  anastomosis. The open areas of the staple line were examined to ensure  that there was hemostasis. The defect was then closed with a single  layer of running Connell suture of 3-0 PDS. Prior to a complete  closure, the NG tube was passed toward the afferent limb.   The appropriate location for the choledochojejunostomy was identified, and  the small bowel was opened approximately 15 mm. The anastamosis was created with approximately thirteen 4-0 interrupted PDS sutures.   The 2 corner sutures were placed first  and then the posterior layer was done in an interrupted fashion tying on  the inside. The superior layer was then closed with interrupted sutures as  well.   At this point the frozens returned back as all negative. The pancreatic  anastomosis was then created by opening the jejunum the length  of the pancreatic parenchyma. The pancreas was soft, and the duct was dilated at  4 mm. A pediatric feeding tube was used as a pancreatic stent. The posterior layer was formed first with 2-0 silk sutures in interrupted fashion. Five 5-0 PDS sutures were used to create the duct to mucosa anastamosis. The anterior layer was then oversewn with 2-0 silks to dunk the pancreatic parenchyma.   The areas were then irrigated and then those anastomoses were covered  with Vistaseal . This was allowed to dry. Some of the omentum from the border of the stomach was secured down over the pancreatic anastamosis and some placed behind.    The abdomen was then irrigated  again and all the laparotomy sponges were removed. A lap count was  performed, which was correct. Two 19-Blake drains were placed, with the  lateral-most drain placed behind the choledochojejunostomy. The medial  Blake drain was placed just anterior and slightly superior to the   pancreaticojejunostomy. The fascia was then closed with #1 looped running PDS sutures. The skin was irrigated and then closed with  staples. The wounds were cleaned, dried and dressed with a sterile  dressing.   The patient tolerated the procedure well and was extubated and taken to  PACU in stable condition. Needle and sponge counts were correct x2.

## 2024-08-02 NOTE — Anesthesia Procedure Notes (Addendum)
 Epidural Patient location during procedure: OB Start time: 08/02/2024 9:00 AM End time: 08/02/2024 9:15 AM  Staffing Anesthesiologist: Keneth Lynwood POUR, MD Performed: anesthesiologist   Preanesthetic Checklist Completed: patient identified, IV checked, site marked, risks and benefits discussed, surgical consent, monitors and equipment checked, pre-op evaluation and timeout performed  Epidural Patient position: sitting Prep: DuraPrep Patient monitoring: heart rate, continuous pulse ox and blood pressure Approach: midline Location: thoracic (1-12) Injection technique: LOR air  Needle:  Needle type: Tuohy  Needle gauge: 17 G Needle length: 9 cm Needle insertion depth: 6 cm Catheter type: closed end flexible Catheter size: 19 Gauge Catheter at skin depth: 10 cm Test dose: negative and 1.5% lidocaine  with Epi 1:200 K  Assessment Events: blood not aspirated, no cerebrospinal fluid, injection not painful, no injection resistance, no paresthesia and negative IV test  Additional Notes Reason for block:at surgeon's request and procedure for pain

## 2024-08-02 NOTE — Anesthesia Postprocedure Evaluation (Signed)
"   Anesthesia Post Note  Patient: Ellaree Fake  Procedure(s) Performed: WHIPPLE PROCEDURE (Abdomen) LAPAROSCOPY, DIAGNOSTIC (Abdomen)     Patient location during evaluation: PACU Anesthesia Type: General Level of consciousness: awake Pain management: pain level controlled (Epidural running/appropriate pain control) Vital Signs Assessment: post-procedure vital signs reviewed and stable Respiratory status: spontaneous breathing Cardiovascular status: blood pressure returned to baseline Postop Assessment: no apparent nausea or vomiting Anesthetic complications: no   No notable events documented.  Last Vitals:  Vitals:   08/02/24 1638 08/02/24 1645  BP: 107/69 101/67  Pulse: 78 75  Resp: 13 12  Temp: 36.6 C   SpO2: 94% 94%    Last Pain:  Vitals:   08/02/24 1638  TempSrc:   PainSc: 0-No pain                 Litsy Epting T Colhoun      "

## 2024-08-03 ENCOUNTER — Encounter (HOSPITAL_COMMUNITY): Payer: Self-pay | Admitting: General Surgery

## 2024-08-03 LAB — COMPREHENSIVE METABOLIC PANEL WITH GFR
ALT: 39 U/L (ref 0–44)
AST: 46 U/L — ABNORMAL HIGH (ref 15–41)
Albumin: 3.3 g/dL — ABNORMAL LOW (ref 3.5–5.0)
Alkaline Phosphatase: 70 U/L (ref 38–126)
Anion gap: 8 (ref 5–15)
BUN: 14 mg/dL (ref 8–23)
CO2: 22 mmol/L (ref 22–32)
Calcium: 7.9 mg/dL — ABNORMAL LOW (ref 8.9–10.3)
Chloride: 109 mmol/L (ref 98–111)
Creatinine, Ser: 0.88 mg/dL (ref 0.61–1.24)
GFR, Estimated: >60 mL/min
Glucose, Bld: 152 mg/dL — ABNORMAL HIGH (ref 70–99)
Potassium: 3.7 mmol/L (ref 3.5–5.1)
Sodium: 139 mmol/L (ref 135–145)
Total Bilirubin: 0.8 mg/dL (ref 0.0–1.2)
Total Protein: 5.5 g/dL — ABNORMAL LOW (ref 6.5–8.1)

## 2024-08-03 LAB — CBC
HCT: 29.4 % — ABNORMAL LOW (ref 39.0–52.0)
HCT: 28.6 % — ABNORMAL LOW (ref 39.0–52.0)
Hemoglobin: 9.4 g/dL — ABNORMAL LOW (ref 13.0–17.0)
Hemoglobin: 9.3 g/dL — ABNORMAL LOW (ref 13.0–17.0)
MCH: 28.5 pg (ref 26.0–34.0)
MCH: 29.2 pg (ref 26.0–34.0)
MCHC: 32 g/dL (ref 30.0–36.0)
MCHC: 32.5 g/dL (ref 30.0–36.0)
MCV: 89.1 fL (ref 80.0–100.0)
MCV: 89.7 fL (ref 80.0–100.0)
Platelets: 177 K/uL (ref 150–400)
Platelets: 173 K/uL (ref 150–400)
RBC: 3.3 MIL/uL — ABNORMAL LOW (ref 4.22–5.81)
RBC: 3.19 MIL/uL — ABNORMAL LOW (ref 4.22–5.81)
RDW: 13.7 % (ref 11.5–15.5)
RDW: 13.8 % (ref 11.5–15.5)
WBC: 12.5 K/uL — ABNORMAL HIGH (ref 4.0–10.5)
WBC: 10.6 K/uL — ABNORMAL HIGH (ref 4.0–10.5)
nRBC: 0 % (ref 0.0–0.2)
nRBC: 0 % (ref 0.0–0.2)

## 2024-08-03 LAB — PROTIME-INR
INR: 1.3 — ABNORMAL HIGH (ref 0.8–1.2)
Prothrombin Time: 16.6 s — ABNORMAL HIGH (ref 11.4–15.2)

## 2024-08-03 LAB — PHOSPHORUS: Phosphorus: 3 mg/dL (ref 2.5–4.6)

## 2024-08-03 LAB — CREATININE, SERUM
Creatinine, Ser: 0.83 mg/dL (ref 0.61–1.24)
GFR, Estimated: >60 mL/min

## 2024-08-03 LAB — MAGNESIUM: Magnesium: 1.8 mg/dL (ref 1.7–2.4)

## 2024-08-03 MED ORDER — ACETAMINOPHEN 10 MG/ML IV SOLN
1000.0000 mg | Freq: Four times a day (QID) | INTRAVENOUS | Status: AC
  Administered 2024-08-03 – 2024-08-04 (×4): 1000 mg via INTRAVENOUS
  Filled 2024-08-03 (×4): qty 100

## 2024-08-03 MED ORDER — KCL IN DEXTROSE-NACL 20-5-0.45 MEQ/L-%-% IV SOLN
INTRAVENOUS | Status: AC
  Filled 2024-08-03: qty 1000

## 2024-08-03 NOTE — Anesthesia Post-op Follow-up Note (Signed)
" °  Anesthesia Pain Follow-up Note  Patient: Andrew Douglas  Day #: 1  Date of Follow-up: 08/03/2024 Time: 11:41 AM  Last Vitals:  Vitals:   08/03/24 0900 08/03/24 1000  BP: 108/63   Pulse: 80 81  Resp: 16 16  Temp:    SpO2: 94% 96%    Level of Consciousness: alert  Pain: mild   Side Effects:None  Catheter Site Exam:clean, dry, no drainage  Anti-Coag Meds (From admission, onward)    Start     Dose/Rate Route Frequency Ordered Stop   08/03/24 0600  heparin  injection 5,000 Units        5,000 Units Subcutaneous Every 8 hours 08/02/24 1728        Epidural / Intrathecal (From admission, onward)    Start     Dose/Rate Route Frequency Ordered Stop   08/02/24 1815  ropivacaine  (PF) 2 mg/mL (0.2%) (NAROPIN ) injection        5 mL/hr 5 mL/hr  Epidural Continuous 08/02/24 1728          Plan: Continue current therapy of postop epidural at surgeon's request  Cordella P Roshunda Keir     "

## 2024-08-03 NOTE — Evaluation (Signed)
 Occupational Therapy Evaluation Patient Details Name: Andrew Douglas MRN: [id number] DOB: 30-Jan-1946 Today's Date: 08/03/2024   History of Present Illness   79 yo M adm 08/02/24 for Whipple procedure due to cholangiocarcinoma. PMHx: cholecystitis, hep C, HTN     Clinical Impressions Pt seen for OT evaluation this PM, agreeable for visit. PTA, he was fully independent, lived active lifestyle, and lives with his wife whom can provide good support upon d/c. Pt seen in ICU with several lines including JP drains x2, NGT, PCA and epidural pump with fentanyl  gtt. He was able to stand with min A and ambulated in room with RW and CGA. Requires max A for LB ADLs and CGA for standing UB ADLs at the sink. Mostly limited by pain; utilized PCA pump a few times during session. Hemodynamically stable throughout on RA. Left upright in chair. Anticipate pt to progress well as he medically stabilizes and pain is controlled.  Pt is currently functioning below baseline and would benefit from ongoing acute OT services to progress towards safe discharge and to facilitate return to prior level of function. Do not anticipate any follow-up OT needs upon d/c, but will continue to monitor needs.     If plan is discharge home, recommend the following:   A little help with walking and/or transfers;A lot of help with bathing/dressing/bathroom;Assistance with cooking/housework;Help with stairs or ramp for entrance;Assist for transportation     Functional Status Assessment   Patient has had a recent decline in their functional status and demonstrates the ability to make significant improvements in function in a reasonable and predictable amount of time.     Equipment Recommendations   Other (comment) (TBD pending pt progress)     Recommendations for Other Services         Precautions/Restrictions   Precautions Precautions: Fall;Other (comment) Recall of Precautions/Restrictions:  Intact Precaution/Restrictions Comments: abdominal precautions; epidural pump; PCA; large bore NGT to suction; JP drains x2 Restrictions Weight Bearing Restrictions Per Provider Order: No     Mobility Bed Mobility Overal bed mobility: Needs Assistance Bed Mobility: Rolling, Sidelying to Sit Rolling: Mod assist Sidelying to sit: Min assist       General bed mobility comments: assist for trunk elevation and cueing through log roll technique, exited to the R side    Transfers Overall transfer level: Needs assistance Equipment used: Rolling walker (2 wheels) Transfers: Sit to/from Stand, Bed to chair/wheelchair/BSC Sit to Stand: Min assist     Step pivot transfers: Contact guard assist     General transfer comment: Stood from bed with cues for hand placement and powering up.      Balance Overall balance assessment: Needs assistance Sitting-balance support: Single extremity supported, No upper extremity supported, Feet supported Sitting balance-Leahy Scale: Fair Sitting balance - Comments: seated EOB, no overt LOB   Standing balance support: Bilateral upper extremity supported, During functional activity, Reliant on assistive device for balance Standing balance-Leahy Scale: Poor Standing balance comment: reliant on RW                           ADL either performed or assessed with clinical judgement   ADL Overall ADL's : Needs assistance/impaired Eating/Feeding: NPO Eating/Feeding Details (indicate cue type and reason): NGT to suction; RN hopeful pt to start clear liquids tomorrow; demo's sufficient UE strength/ROM to mouth/face Grooming: Contact guard assist;Standing;Wash/dry face;Oral care Grooming Details (indicate cue type and reason): sinkside  Upper Body Dressing : Minimal assistance;Sitting Upper Body Dressing Details (indicate cue type and reason): adjusting second gown around back Lower Body Dressing: Maximal assistance;Sitting/lateral  leans Lower Body Dressing Details (indicate cue type and reason): too painful to attempt figure four technique     Toileting- Clothing Manipulation and Hygiene: Maximal assistance Toileting - Clothing Manipulation Details (indicate cue type and reason): foley intact     Functional mobility during ADLs: Contact guard assist;Rolling walker (2 wheels)       Vision Baseline Vision/History: 1 Wears glasses (readers) Ability to See in Adequate Light: 0 Adequate Patient Visual Report: No change from baseline       Perception         Praxis         Pertinent Vitals/Pain Pain Assessment Pain Assessment: 0-10 Pain Score: 5  Pain Location: abdomen Pain Descriptors / Indicators: Discomfort, Grimacing, Guarding Pain Intervention(s): Limited activity within patient's tolerance, Monitored during session, Repositioned, Other (comment) (pt utilizing PCA pump during session)     Extremity/Trunk Assessment Upper Extremity Assessment Upper Extremity Assessment: Overall WFL for tasks assessed   Lower Extremity Assessment Lower Extremity Assessment: Defer to PT evaluation       Communication Communication Communication: No apparent difficulties   Cognition Arousal: Alert Behavior During Therapy: WFL for tasks assessed/performed Cognition: No apparent impairments             OT - Cognition Comments: sleeping upon arrival, but arouses easily and very participatory with OT                 Following commands: Intact       Cueing  General Comments   Cueing Techniques: Verbal cues;Gestural cues  VSS throughout on RA; epidural visualized and intact; pt utilizing PCA pump a few times during session   Exercises     Shoulder Instructions      Home Living Family/patient expects to be discharged to:: Private residence Living Arrangements: Spouse/significant other Available Help at Discharge: Family;Available 24 hours/day Type of Home: House Home Access: Stairs to  enter [employer] of Steps: 3 Entrance Stairs-Rails: None Home Layout: One level     Bathroom Shower/Tub: Chief Strategy Officer: Handicapped height     Home Equipment: None   Additional Comments: Owns a painting business      Prior Functioning/Environment Prior Level of Function : Independent/Modified Independent;Driving             Mobility Comments: no AD PTA, runs on the treadmill and lifts weights ADLs Comments: independent, drives    OT Problem List: Decreased activity tolerance;Impaired balance (sitting and/or standing);Pain   OT Treatment/Interventions: Self-care/ADL training;Therapeutic exercise;Energy conservation;DME and/or AE instruction;Therapeutic activities;Patient/family education;Balance training      OT Goals(Current goals can be found in the care plan section)   Acute Rehab OT Goals Patient Stated Goal: get better OT Goal Formulation: With patient Time For Goal Achievement: 08/17/24 Potential to Achieve Goals: Good   OT Frequency:  Min 2X/week    Co-evaluation              AM-PAC OT 6 Clicks Daily Activity     Outcome Measure Help from another person eating meals?: Total (NPO) Help from another person taking care of personal grooming?: A Little Help from another person toileting, which includes using toliet, bedpan, or urinal?: A Lot Help from another person bathing (including washing, rinsing, drying)?: A Lot Help from another person to put on and taking off regular upper body  clothing?: A Little Help from another person to put on and taking off regular lower body clothing?: A Lot 6 Click Score: 13   End of Session Equipment Utilized During Treatment: Rolling walker (2 wheels) Nurse Communication: Mobility status;Other (comment) (pt status)  Activity Tolerance: Patient tolerated treatment well Patient left: in chair;with call bell/phone within reach;with nursing/sitter in room  OT Visit Diagnosis: Other  abnormalities of gait and mobility (R26.89);Pain Pain - part of body:  (abdomen)                Time: 8576-8544 OT Time Calculation (min): 32 min Charges:  OT General Charges $OT Visit: 1 Visit OT Evaluation $OT Eval Moderate Complexity: 1 Mod OT Treatments $Self Care/Home Management : 8-22 mins  Aiko Belko M. [place], OTR/L [facility] Acute Rehabilitation Services [phone] Secure Chat Preferred  Alyxis Grippi 08/03/2024, 4:57 PM

## 2024-08-03 NOTE — Progress Notes (Signed)
 1 Day Post-Op   Subjective/Chief Complaint: Pain ok, just sore.  No acute events   Objective: Vital signs in last 24 hours: Temp:  [97.6 F (36.4 C)-99.6 F (37.6 C)] 99.6 F (37.6 C) (01/23 0800) Pulse Rate:  [66-88] 82 (01/23 0800) Resp:  [8-24] 8 (01/23 0800) BP: (96-134)/(57-75) 113/63 (01/23 0800) SpO2:  [92 %-98 %] 94 % (01/23 0800) Arterial Line BP: (110-155)/(48-72) 122/51 (01/23 0800)    Intake/Output from previous day: 01/22 0701 - 01/23 0700 In: 5366.3 [I.V.:3897.3; IV Piggyback:1361.4] Out: 2110 [Urine:1535; Emesis/NG output:50; Drains:325; Blood:200] Intake/Output this shift: Total I/O In: 105 [I.V.:100; Other:5] Out: -   General:  looks comfortable Resp: no distress CV:  RR&R Abd:  dressing in place, drains serosang. Ext:  warm, well perfused.    Lab Results:  Recent Labs    08/03/24 0039 08/03/24 0540  WBC 12.5* 10.6*  HGB 9.4* 9.3*  HCT 29.4* 28.6*  PLT 177 173   BMET Recent Labs    08/02/24 1458 08/03/24 0039 08/03/24 0540  NA 144  --  139  K 3.8  --  3.7  CL  --   --  109  CO2  --   --  22  GLUCOSE  --   --  152*  BUN  --   --  14  CREATININE  --  0.83 0.88  CALCIUM  --   --  7.9*   PT/INR Recent Labs    08/03/24 0540  LABPROT 16.6*  INR 1.3*   ABG Recent Labs    08/02/24 1215 08/02/24 1458  PHART 7.338* 7.324*  HCO3 24.2 22.6    Studies/Results: No results found.  Anti-infectives: Anti-infectives (From admission, onward)    Start     Dose/Rate Route Frequency Ordered Stop   08/02/24 2200  ceFAZolin  (ANCEF ) IVPB 2g/100 mL premix        2 g 200 mL/hr over 30 Minutes Intravenous Every 8 hours 08/02/24 1728 08/02/24 2350   08/02/24 0745  ceFAZolin  (ANCEF ) IVPB 2g/100 mL premix        2 g 200 mL/hr over 30 Minutes Intravenous On call to O.R. 08/02/24 0731 08/02/24 0954       Assessment/Plan: s/p Procedures with comments: WHIPPLE PROCEDURE (N/A) - DX LAPAROSCOPY WHIPPLE 08/02/24 [facility] [place] for distal  cholangiocarcinoma, await final pathology  Continue foley, continue epidural, prn robaxin , standing tylenol , pca reduced dose fentanyl .  IS, PT consult Sq heparin  with epidural NPO/NGT Anticipate probable d/c ngt tomorrow and start sips.    D/c art line Get up and out of bed PT/OT consults for d/c planning Keep in ICU today while mobilizing. Decrease IV fluids Continue IV tylenol  1 more day.     LOS: 1 day    Jina LITTIE Nephew, MD, FACS, FSSO Surgical Oncology, General Surgery, Trauma and Critical [facility] Surgery, [place] [phone] for weekday/non holidays Check [email or web address] for coverage night/weekend/holidays under General Surgery    08/03/2024

## 2024-08-03 NOTE — Evaluation (Signed)
 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 PMHx: cholecystitis, hep C, HTN  Clinical Impression  Pt pleasant and reports being active at home with wife without AD or need for assist at baseline. Pt limited by pain this date requiring assist for transfers and RW for gait but anticipate he will progress well as healing and pain management continue. Pt will benefit from acute therapy to maximize mobility, safety and function to decrease burden of care without post acute needs at this time.         If plan is discharge home, recommend the following: A little help with walking and/or transfers;A little help with bathing/dressing/bathroom;Assistance with cooking/housework;Assist for transportation   Can travel by private vehicle        Equipment Recommendations Rolling walker (2 wheels)  Recommendations for Other Services       Functional Status Assessment Patient has had a recent decline in their functional status and demonstrates the ability to make significant improvements in function in a reasonable and predictable amount of time.     Precautions / Restrictions Precautions Precautions: Fall;Other (comment) Recall of Precautions/Restrictions: Intact Precaution/Restrictions Comments: epidural pump, pca, art line, NGT, 2 jp drains      Mobility  Bed Mobility Overal bed mobility: Needs Assistance Bed Mobility: Rolling, Sidelying to Sit Rolling: Min assist Sidelying to sit: Min assist       General bed mobility comments: cues for sequence with assist to roll to side and rise to sitting    Transfers Overall transfer level: Needs assistance   Transfers: Sit to/from Stand Sit to Stand: From elevated surface, Contact guard assist           General transfer comment: cues for hand placement and  safety from higher surface of bed mimicking home height    Ambulation/Gait Ambulation/Gait assistance: Contact guard assist Gait Distance (Feet): 150 Feet Assistive device: Rolling walker (2 wheels) Gait Pattern/deviations: Step-through pattern, Decreased stride length, Trunk flexed   Gait velocity interpretation: 1.31 - 2.62 ft/sec, indicative of limited community ambulator   General Gait Details: cues for posture, proximity to RW and direction  Stairs            Wheelchair Mobility     Tilt Bed    Modified Rankin (Stroke Patients Only)       Balance Overall balance assessment: Needs assistance Sitting-balance support: No upper extremity supported, Feet supported Sitting balance-Leahy Scale: Fair     Standing balance support: Bilateral upper extremity supported, During functional activity, Reliant on assistive device for balance Standing balance-Leahy Scale: Poor Standing balance comment: Rw in standing                             Pertinent Vitals/Pain Pain Assessment Pain Assessment: 0-10 Pain Score: 8  Pain Location: abdomen Pain Descriptors / Indicators: Aching, Guarding Pain Intervention(s): Limited activity within patient's tolerance, Monitored during session, PCA encouraged, Repositioned    Home Living Family/patient expects to be discharged to:: Private residence Living Arrangements: Spouse/significant other Available Help at Discharge: Family;Available 24 hours/day Type of Home: House Home Access: Stairs to enter   [employer] of Steps: 3   Home Layout: One level Home Equipment: None Additional Comments: Owns a painting business    Prior Function Prior Level of Function : Independent/Modified Independent  Mobility Comments: no assistive device, runs on the treadmill and lifts weights ADLs Comments: independent, drives     Extremity/Trunk Assessment   Upper Extremity Assessment Upper Extremity  Assessment: Overall WFL for tasks assessed    Lower Extremity Assessment Lower Extremity Assessment: Overall WFL for tasks assessed    Cervical / Trunk Assessment Cervical / Trunk Assessment: Other exceptions Cervical / Trunk Exceptions: abdominal sx with guarding  Communication   Communication Communication: No apparent difficulties    Cognition Arousal: Alert Behavior During Therapy: WFL for tasks assessed/performed   PT - Cognitive impairments: No apparent impairments                         Following commands: Intact       Cueing Cueing Techniques: Verbal cues     General Comments      Exercises     Assessment/Plan    PT Assessment Patient needs continued PT services  PT Problem List Decreased activity tolerance;Decreased mobility;Decreased knowledge of use of DME;Pain       PT Treatment Interventions Gait training;Stair training;Functional mobility training;Therapeutic activities;Patient/family education;DME instruction;Therapeutic exercise    PT Goals (Current goals can be found in the Care Plan section)  Acute Rehab PT Goals Patient Stated Goal: return home PT Goal Formulation: With patient/family Time For Goal Achievement: 08/17/24 Potential to Achieve Goals: Good    Frequency Min 2X/week     Co-evaluation               AM-PAC PT 6 Clicks Mobility  Outcome Measure Help needed turning from your back to your side while in a flat bed without using bedrails?: A Little Help needed moving from lying on your back to sitting on the side of a flat bed without using bedrails?: A Little Help needed moving to and from a bed to a chair (including a wheelchair)?: A Little Help needed standing up from a chair using your arms (e.g., wheelchair or bedside chair)?: A Little Help needed to walk in hospital room?: A Little Help needed climbing 3-5 steps with a railing? : A Lot 6 Click Score: 17    End of Session   Activity Tolerance: Patient  tolerated treatment well Patient left: in chair;with call bell/phone within reach;with family/visitor present;with nursing/sitter in room Nurse Communication: Mobility status PT Visit Diagnosis: Other abnormalities of gait and mobility (R26.89);Difficulty in walking, not elsewhere classified (R26.2)    Time: 9044-8977 PT Time Calculation (min) (ACUTE ONLY): 27 min   Charges:   PT Evaluation $PT Eval Moderate Complexity: 1 Mod PT Treatments $Gait Training: 8-22 mins PT General Charges $$ ACUTE PT VISIT: 1 Visit         Lenoard SQUIBB, PT Acute Rehabilitation Services Office: [phone]   Lenoard NOVAK Mahi Zabriskie 08/03/2024, 10:42 AM

## 2024-08-03 NOTE — TOC CM/SW Note (Signed)
 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  Continue pain control with PCA, epidura; Inpatient Care Management will continue to follow as patient progresses.     Transition of Care Asessment: Insurance and Status: Insurance coverage has been reviewed Patient has primary care physician: Yes (Caleb Turmel) Home environment has been reviewed: Lives with spouse Prior level of function:: Independent Prior/Current Home Services: No current home services Social Drivers of Health Review: SDOH reviewed no interventions necessary Readmission risk has been reviewed: Yes Transition of care needs: no transition of care needs at this time  Mliss MICAEL Fass, RN, BSN  Trauma/Neuro ICU Case Manager [phone]

## 2024-08-03 NOTE — Addendum Note (Signed)
 Addendum  created 08/03/24 1142 by Dorethea Cordella SQUIBB, DO   Clinical Note Signed

## 2024-08-04 LAB — COMPREHENSIVE METABOLIC PANEL WITH GFR
ALT: 31 U/L (ref 0–44)
AST: 32 U/L (ref 15–41)
Albumin: 3.1 g/dL — ABNORMAL LOW (ref 3.5–5.0)
Alkaline Phosphatase: 67 U/L (ref 38–126)
Anion gap: 7 (ref 5–15)
BUN: 8 mg/dL (ref 8–23)
CO2: 22 mmol/L (ref 22–32)
Calcium: 8.1 mg/dL — ABNORMAL LOW (ref 8.9–10.3)
Chloride: 110 mmol/L (ref 98–111)
Creatinine, Ser: 0.76 mg/dL (ref 0.61–1.24)
GFR, Estimated: >60 mL/min
Glucose, Bld: 103 mg/dL — ABNORMAL HIGH (ref 70–99)
Potassium: 3.4 mmol/L — ABNORMAL LOW (ref 3.5–5.1)
Sodium: 139 mmol/L (ref 135–145)
Total Bilirubin: 0.8 mg/dL (ref 0.0–1.2)
Total Protein: 5.5 g/dL — ABNORMAL LOW (ref 6.5–8.1)

## 2024-08-04 LAB — CBC
HCT: 26.7 % — ABNORMAL LOW (ref 39.0–52.0)
Hemoglobin: 8.7 g/dL — ABNORMAL LOW (ref 13.0–17.0)
MCH: 28.9 pg (ref 26.0–34.0)
MCHC: 32.6 g/dL (ref 30.0–36.0)
MCV: 88.7 fL (ref 80.0–100.0)
Platelets: 167 10*3/uL (ref 150–400)
RBC: 3.01 MIL/uL — ABNORMAL LOW (ref 4.22–5.81)
RDW: 13.7 % (ref 11.5–15.5)
WBC: 8.1 10*3/uL (ref 4.0–10.5)
nRBC: 0 % (ref 0.0–0.2)

## 2024-08-04 LAB — PROTIME-INR
INR: 1.3 — ABNORMAL HIGH (ref 0.8–1.2)
Prothrombin Time: 16.5 s — ABNORMAL HIGH (ref 11.4–15.2)

## 2024-08-04 LAB — MAGNESIUM: Magnesium: 1.8 mg/dL (ref 1.7–2.4)

## 2024-08-04 LAB — PHOSPHORUS: Phosphorus: 1.5 mg/dL — ABNORMAL LOW (ref 2.5–4.6)

## 2024-08-04 MED ORDER — PHENOL 1.4 % MT LIQD
1.0000 | OROMUCOSAL | Status: DC | PRN
  Administered 2024-08-04: 1 via OROMUCOSAL
  Filled 2024-08-04: qty 177

## 2024-08-04 MED ORDER — HEPARIN SODIUM (PORCINE) 5000 UNIT/ML IJ SOLN
5000.0000 [IU] | Freq: Three times a day (TID) | INTRAMUSCULAR | Status: DC
  Administered 2024-08-04 – 2024-08-10 (×18): 5000 [IU] via SUBCUTANEOUS
  Filled 2024-08-04 (×18): qty 1

## 2024-08-04 MED ORDER — POTASSIUM PHOSPHATES 15 MMOLE/5ML IV SOLN
30.0000 mmol | Freq: Once | INTRAVENOUS | Status: AC
  Administered 2024-08-04: 30 mmol via INTRAVENOUS
  Filled 2024-08-04: qty 10

## 2024-08-04 NOTE — Progress Notes (Signed)
 Pharmacy Electrolyte Replacement  Recent Labs:  Recent Labs    08/04/24 0808  K 3.4*  MG 1.8  PHOS 1.5*  CREATININE 0.76    Low Critical Values (K </= 2.5, Phos </= 1, Mg </= 1) Present: None  MD Contacted: n/a  Plan:  IV Kphos 30 mmol X1 Recheck with AM labs  Thank you for allowing pharmacy to be a part of this patients care.  Shelba Collier, PharmD, BCPS Clinical Pharmacist

## 2024-08-04 NOTE — Addendum Note (Signed)
 Addendum  created 08/04/24 1926 by Leopoldo Bruckner, MD   Clinical Note Signed

## 2024-08-04 NOTE — Progress Notes (Signed)
 Patient transferred into 4N01 from ICU. Patient up in chair and comfortable at this time. NGT clamped per order. Vitals and assessment stable at this time. Wife at bedside and both patient and spouse oriented to room and call light.

## 2024-08-04 NOTE — Anesthesia Post-op Follow-up Note (Signed)
" °  Anesthesia Pain Follow-up Note  Patient: Andrew Douglas  Day #: 2  Date of Follow-up: 08/04/2024 Time: 7:26 PM  Last Vitals:  Vitals:   08/04/24 1632 08/04/24 1746  BP:  138/77  Pulse:  85  Resp: 18 20  Temp:  36.9 C  SpO2:  97%    Level of Consciousness: alert  Pain: mild   Side Effects:None  Catheter Site Exam:clean, dry  Anti-Coag Meds (From admission, onward)    Start     Dose/Rate Route Frequency Ordered Stop   08/04/24 1515  heparin  injection 5,000 Units        5,000 Units Subcutaneous Every 8 hours 08/04/24 1415        Epidural / Intrathecal (From admission, onward)    Start     Dose/Rate Route Frequency Ordered Stop   08/02/24 1815  ropivacaine  (PF) 2 mg/mL (0.2%) (NAROPIN ) injection        5 mL/hr 5 mL/hr  Epidural Continuous 08/02/24 1728          Plan: Continue current therapy of postop epidural at surgeon's request  Shallen Luedke     "

## 2024-08-04 NOTE — Progress Notes (Signed)
 2 Days Post-Op   Subjective/Chief Complaint: Feels great. No acute events. No n/v. Denies any distention.    Objective: Vital signs in last 24 hours: Temp:  [98 F (36.7 C)-98.4 F (36.9 C)] 98.3 F (36.8 C) (01/24 0800) Pulse Rate:  [67-85] 85 (01/24 0800) Resp:  [5-26] 19 (01/24 0800) BP: (94-146)/(63-89) 114/68 (01/24 0800) SpO2:  [93 %-98 %] 94 % (01/24 0800) Arterial Line BP: (129)/(55) 129/55 (01/23 1000) Last BM Date :  (PTA)  Intake/Output from previous day: 01/23 0701 - 01/24 0700 In: 1974.9 [I.V.:1548.5; IV Piggyback:311.3] Out: 2065 [Urine:1620; Emesis/NG output:250; Drains:195] Intake/Output this shift: Total I/O In: 240.7 [I.V.:119.8; Other:10; IV Piggyback:110.9] Out: -   General:  looks comfortable Resp: no distress CV:  RR&R Abd:  dressing in place, drains serosang. Ext:  warm, well perfused.    Lab Results:  Recent Labs    08/03/24 0540 08/04/24 0808  WBC 10.6* 8.1  HGB 9.3* 8.7*  HCT 28.6* 26.7*  PLT 173 167   BMET Recent Labs    08/03/24 0540 08/04/24 0808  NA 139 139  K 3.7 3.4*  CL 109 110  CO2 22 22  GLUCOSE 152* 103*  BUN 14 8  CREATININE 0.88 0.76  CALCIUM 7.9* 8.1*   PT/INR Recent Labs    08/03/24 0540 08/04/24 0808  LABPROT 16.6* 16.5*  INR 1.3* 1.3*   ABG Recent Labs    08/02/24 1215 08/02/24 1458  PHART 7.338* 7.324*  HCO3 24.2 22.6    Studies/Results: No results found.  Anti-infectives: Anti-infectives (From admission, onward)    Start     Dose/Rate Route Frequency Ordered Stop   08/02/24 2200  ceFAZolin  (ANCEF ) IVPB 2g/100 mL premix        2 g 200 mL/hr over 30 Minutes Intravenous Every 8 hours 08/02/24 1728 08/02/24 2350   08/02/24 0745  ceFAZolin  (ANCEF ) IVPB 2g/100 mL premix        2 g 200 mL/hr over 30 Minutes Intravenous On call to O.R. 08/02/24 0731 08/02/24 0954       Assessment/Plan: s/p Procedures with comments: WHIPPLE PROCEDURE (N/A) - DX LAPAROSCOPY WHIPPLE 08/02/24 [place] for  distal cholangiocarcinoma, await final pathology  Continue foley, ok to d/c epidural per anesthesia, prn robaxin , standing tylenol , pca reduced dose fentanyl .  IS, PT consult Sq heparin  with epidural NPO/NGT Clamp NG, allowing bari clears   Get up and out of bed PT/OT consults for d/c planning Transfer to 4NP Maintenance  IV fluids   LOS: 2 days   Check [email or web address] for General Surgery coverage night/weekend/holidays  Page if acute issues. No secure chat available for me given surgeries/clinic/off post call which would lead to a delay in care.  Lonni Pizza, MD [facility], [facility]  08/04/2024

## 2024-08-05 LAB — COMPREHENSIVE METABOLIC PANEL WITH GFR
ALT: 27 U/L (ref 0–44)
AST: 24 U/L (ref 15–41)
Albumin: 3.1 g/dL — ABNORMAL LOW (ref 3.5–5.0)
Alkaline Phosphatase: 74 U/L (ref 38–126)
Anion gap: 9 (ref 5–15)
BUN: 6 mg/dL — ABNORMAL LOW (ref 8–23)
CO2: 23 mmol/L (ref 22–32)
Calcium: 8.3 mg/dL — ABNORMAL LOW (ref 8.9–10.3)
Chloride: 107 mmol/L (ref 98–111)
Creatinine, Ser: 0.8 mg/dL (ref 0.61–1.24)
GFR, Estimated: >60 mL/min
Glucose, Bld: 102 mg/dL — ABNORMAL HIGH (ref 70–99)
Potassium: 3.6 mmol/L (ref 3.5–5.1)
Sodium: 139 mmol/L (ref 135–145)
Total Bilirubin: 0.9 mg/dL (ref 0.0–1.2)
Total Protein: 6 g/dL — ABNORMAL LOW (ref 6.5–8.1)

## 2024-08-05 LAB — CBC
HCT: 29 % — ABNORMAL LOW (ref 39.0–52.0)
Hemoglobin: 9.5 g/dL — ABNORMAL LOW (ref 13.0–17.0)
MCH: 28.8 pg (ref 26.0–34.0)
MCHC: 32.8 g/dL (ref 30.0–36.0)
MCV: 87.9 fL (ref 80.0–100.0)
Platelets: 185 10*3/uL (ref 150–400)
RBC: 3.3 MIL/uL — ABNORMAL LOW (ref 4.22–5.81)
RDW: 13.4 % (ref 11.5–15.5)
WBC: 9 10*3/uL (ref 4.0–10.5)
nRBC: 0 % (ref 0.0–0.2)

## 2024-08-05 LAB — PHOSPHORUS: Phosphorus: 2.5 mg/dL (ref 2.5–4.6)

## 2024-08-05 LAB — MAGNESIUM: Magnesium: 1.9 mg/dL (ref 1.7–2.4)

## 2024-08-05 MED ORDER — METHOCARBAMOL 500 MG PO TABS
500.0000 mg | ORAL_TABLET | Freq: Four times a day (QID) | ORAL | Status: DC | PRN
  Administered 2024-08-05 – 2024-08-09 (×5): 500 mg via ORAL
  Filled 2024-08-05 (×5): qty 1

## 2024-08-05 MED ORDER — HYDROCODONE-ACETAMINOPHEN 5-325 MG PO TABS
1.0000 | ORAL_TABLET | ORAL | Status: DC | PRN
  Administered 2024-08-05 – 2024-08-10 (×7): 1 via ORAL
  Filled 2024-08-05 (×7): qty 1

## 2024-08-05 MED ORDER — DOCUSATE SODIUM 100 MG PO CAPS
100.0000 mg | ORAL_CAPSULE | Freq: Two times a day (BID) | ORAL | Status: DC
  Administered 2024-08-05 – 2024-08-10 (×11): 100 mg via ORAL
  Filled 2024-08-05 (×11): qty 1

## 2024-08-05 MED ORDER — POTASSIUM PHOSPHATES 15 MMOLE/5ML IV SOLN
30.0000 mmol | Freq: Once | INTRAVENOUS | Status: AC
  Administered 2024-08-05: 30 mmol via INTRAVENOUS
  Filled 2024-08-05: qty 10

## 2024-08-05 MED ORDER — ROPIVACAINE HCL 2 MG/ML IJ SOLN
5.0000 mL/h | INTRAMUSCULAR | Status: DC
  Administered 2024-08-06: 5 mL/h via EPIDURAL
  Filled 2024-08-05: qty 200

## 2024-08-05 MED ORDER — POLYETHYLENE GLYCOL 3350 17 G PO PACK
17.0000 g | PACK | Freq: Every day | ORAL | Status: DC | PRN
  Filled 2024-08-05: qty 1

## 2024-08-05 NOTE — Anesthesia Post-op Follow-up Note (Signed)
" °  Anesthesia Pain Follow-up Note  Patient: Andrew Douglas  Day #: 3  Date of Follow-up: 08/05/2024 Time: 7:11 PM  Last Vitals:  Vitals:   08/05/24 1200 08/05/24 1528  BP:  (!) 117/94  Pulse:  89  Resp: 18 (!) 23  Temp:  36.6 C  SpO2:  97%    Level of Consciousness: alert  Pain: 3 /10   Side Effects:None  Catheter Site Exam:dry, dried blood under dressing, tape intact across dressing and shoulders.   Anti-Coag Meds (From admission, onward)    Start     Dose/Rate Route Frequency Ordered Stop   08/04/24 1515  heparin  injection 5,000 Units        5,000 Units Subcutaneous Every 8 hours 08/04/24 1415          Plan: Continue current therapy of postop epidural at surgeon's request  Lauraine DASEN Colhoun     "

## 2024-08-05 NOTE — Plan of Care (Signed)
" °  Problem: Pain Managment: Goal: General experience of comfort will improve and/or be controlled Outcome: Progressing   Problem: Safety: Goal: Ability to remain free from injury will improve Outcome: Progressing   Problem: Skin Integrity: Goal: Risk for impaired skin integrity will decrease Outcome: Progressing   Problem: Education: Goal: Knowledge of the prescribed therapeutic regimen will improve Outcome: Progressing   "

## 2024-08-05 NOTE — Progress Notes (Addendum)
 "   3 Days Post-Op  Subjective: CC: Seen with RN's.  Patient reports pain in his abdomen around his incision only w/ movement. Pain controlled. Still w/ PCA.  NGT in place, clamped. Tolerating bari cld without increased abdominal pain, bloating, n/v. Passing flatus. No BM.  Foley in place w/ good uop Mobilizing. Reports support from family at d/c.   HDS.  WBC wnl. Hgb stable.  Cr wnl. LFT's wnl  Objective: Vital signs in last 24 hours: Temp:  [98.5 F (36.9 C)-99.5 F (37.5 C)] 99.5 F (37.5 C) (01/25 0700) Pulse Rate:  [78-93] 79 (01/25 0700) Resp:  [15-21] 18 (01/25 0914) BP: (131-142)/(64-98) 142/73 (01/25 0700) SpO2:  [96 %-98 %] 97 % (01/25 0914) Last BM Date :  (pta)  Intake/Output from previous day: 01/24 0701 - 01/25 0700 In: 1131.9 [I.V.:770.7; IV Piggyback:246.9] Out: 2885 [Urine:2800; Drains:85] Intake/Output this shift: Total I/O In: -  Out: 35 [Drains:35]  PE: Gen:  Alert, NAD, pleasant HEENT: EOM's intact, pupils equal and round Card:  RRR Pulm:  CTAB, no W/R/R, effort normal Abd: Soft, mild distension, appropriately ttp around his incision, midline wound with staples in place - faint area of erythema on superior right aspect of incision w/o drainage noted, Also w/ raised rash on left abdomen - see picture below JP 1 - 10cc/24 hours - cloudy SS JP 2 - 75cc/24 hours - SS Ext:  No LE edema  Psych: A&Ox3    Lab Results:  Recent Labs    08/04/24 0808 08/05/24 0510  WBC 8.1 9.0  HGB 8.7* 9.5*  HCT 26.7* 29.0*  PLT 167 185   BMET Recent Labs    08/04/24 0808 08/05/24 0510  NA 139 139  K 3.4* 3.6  CL 110 107  CO2 22 23  GLUCOSE 103* 102*  BUN 8 6*  CREATININE 0.76 0.80  CALCIUM 8.1* 8.3*   PT/INR Recent Labs    08/03/24 0540 08/04/24 0808  LABPROT 16.6* 16.5*  INR 1.3* 1.3*   CMP     Component Value Date/Time   NA 139 08/05/2024 0510   K 3.6 08/05/2024 0510   CL 107 08/05/2024 0510   CO2 23 08/05/2024 0510   GLUCOSE 102 (H)  08/05/2024 0510   BUN 6 (L) 08/05/2024 0510   CREATININE 0.80 08/05/2024 0510   CREATININE 1.03 04/20/2024 1402   CALCIUM 8.3 (L) 08/05/2024 0510   PROT 6.0 (L) 08/05/2024 0510   ALBUMIN  3.1 (L) 08/05/2024 0510   AST 24 08/05/2024 0510   ALT 27 08/05/2024 0510   ALKPHOS 74 08/05/2024 0510   BILITOT 0.9 08/05/2024 0510   GFRNONAA >60 08/05/2024 0510   GFRNONAA >60 04/20/2024 1402   Lipase     Component Value Date/Time   LIPASE 18 05/16/2024 1645    Studies/Results: No results found.  Anti-infectives: Anti-infectives (From admission, onward)    Start     Dose/Rate Route Frequency Ordered Stop   08/02/24 2200  ceFAZolin  (ANCEF ) IVPB 2g/100 mL premix        2 g 200 mL/hr over 30 Minutes Intravenous Every 8 hours 08/02/24 1728 08/02/24 2350   08/02/24 0745  ceFAZolin  (ANCEF ) IVPB 2g/100 mL premix        2 g 200 mL/hr over 30 Minutes Intravenous On call to O.R. 08/02/24 0731 08/02/24 0954        Assessment/Plan POD 3 s/p diagnostic laparoscopy, SBR, Classic pancreaticoduodenectomy, Placement of pancreatic duct stent by Dr. [name] on 08/02/24 for distal cholangiocarcinoma  -  Intra-op findings: 1-2 cm periampullary mass. soft pancreatic tissue. 12 mm common bile duct. 4 mm pancreatic duct. Several duodenal diverticula were seen and resected.  NGT confirmed to be in through the gastric anastamosis.  - Path pending - D/c PCA, transition to oral medications  - D/c NGT, Adv to FLD - D/c Foley, TOV - Monitor midline - Cont JP x 2 - Mobilize, PT - Pulm toilet  FEN - Adv to FLD VTE - SCDs, SQH ID - Ancef  peri-op, none currently Foley - In place, TOV Plan - As above  HTN - PRN meds   LOS: 3 days    Andrew Douglas, [facility] Surgery 08/05/2024, 11:49 AM Please see Amion for pager number during day hours 7:00am-4:30pm  "

## 2024-08-05 NOTE — Plan of Care (Signed)

## 2024-08-05 NOTE — Progress Notes (Signed)
 Pharmacy Electrolyte Replacement  Recent Labs:  Recent Labs    08/05/24 0510  K 3.6  MG 1.9  PHOS 2.5  CREATININE 0.80    Low Critical Values (K </= 2.5, Phos </= 1, Mg </= 1) Present: None  MD Contacted: n/a  Plan:  IV Kphos 30 mmol x1  Thank you for allowing pharmacy to be a part of this patients care.  Shelba Collier, PharmD, BCPS Clinical Pharmacist

## 2024-08-05 NOTE — Addendum Note (Signed)
 Addendum  created 08/05/24 1914 by Dene Lauraine DASEN, MD   Clinical Note Signed

## 2024-08-06 LAB — CBC
HCT: 28.4 % — ABNORMAL LOW (ref 39.0–52.0)
Hemoglobin: 9.7 g/dL — ABNORMAL LOW (ref 13.0–17.0)
MCH: 29 pg (ref 26.0–34.0)
MCHC: 34.2 g/dL (ref 30.0–36.0)
MCV: 85 fL (ref 80.0–100.0)
Platelets: 216 10*3/uL (ref 150–400)
RBC: 3.34 MIL/uL — ABNORMAL LOW (ref 4.22–5.81)
RDW: 13.3 % (ref 11.5–15.5)
WBC: 9.5 10*3/uL (ref 4.0–10.5)
nRBC: 0 % (ref 0.0–0.2)

## 2024-08-06 LAB — COMPREHENSIVE METABOLIC PANEL WITH GFR
ALT: 22 U/L (ref 0–44)
AST: 22 U/L (ref 15–41)
Albumin: 3 g/dL — ABNORMAL LOW (ref 3.5–5.0)
Alkaline Phosphatase: 81 U/L (ref 38–126)
Anion gap: 11 (ref 5–15)
BUN: 9 mg/dL (ref 8–23)
CO2: 22 mmol/L (ref 22–32)
Calcium: 8.2 mg/dL — ABNORMAL LOW (ref 8.9–10.3)
Chloride: 106 mmol/L (ref 98–111)
Creatinine, Ser: 0.81 mg/dL (ref 0.61–1.24)
GFR, Estimated: >60 mL/min
Glucose, Bld: 101 mg/dL — ABNORMAL HIGH (ref 70–99)
Potassium: 3.9 mmol/L (ref 3.5–5.1)
Sodium: 138 mmol/L (ref 135–145)
Total Bilirubin: 0.8 mg/dL (ref 0.0–1.2)
Total Protein: 6 g/dL — ABNORMAL LOW (ref 6.5–8.1)

## 2024-08-06 MED ORDER — ENSURE PLUS HIGH PROTEIN PO LIQD
237.0000 mL | Freq: Two times a day (BID) | ORAL | Status: DC
  Administered 2024-08-06 – 2024-08-10 (×8): 237 mL via ORAL

## 2024-08-06 MED ORDER — PANTOPRAZOLE SODIUM 40 MG PO TBEC
40.0000 mg | DELAYED_RELEASE_TABLET | Freq: Every day | ORAL | Status: DC
  Administered 2024-08-06 – 2024-08-09 (×4): 40 mg via ORAL
  Filled 2024-08-06 (×4): qty 1

## 2024-08-06 NOTE — Plan of Care (Signed)

## 2024-08-06 NOTE — Progress Notes (Signed)
 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 PMHx: cholecystitis, hep C, HTN    PT Comments  Pt with improved ambulation tolerance however continues to require assist for bed mobility and is resistant to log roll technique despite max verbal cues to not pull self up into long sit. Pt requires assist for LE management back into bed. Pt with improved upright posture as well. Acute PT to cont to follow.    If plan is discharge home, recommend the following: A little help with walking and/or transfers;A little help with bathing/dressing/bathroom;Assistance with cooking/housework;Assist for transportation   Can travel by private vehicle        Equipment Recommendations  Rolling walker (2 wheels)    Recommendations for Other Services       Precautions / Restrictions Precautions Precautions: Fall;Other (comment) Recall of Precautions/Restrictions: Intact Precaution/Restrictions Comments: abdominal precautions; epidural pump; PCA; large bore NGT to suction; JP drains x2 Restrictions Weight Bearing Restrictions Per Provider Order: No     Mobility  Bed Mobility Overal bed mobility: Needs Assistance Bed Mobility: Rolling, Sidelying to Sit Rolling: Min assist Sidelying to sit: Mod assist       General bed mobility comments: assist for trunk elevation and cueing through log roll technique, exited to the R side    Transfers Overall transfer level: Needs assistance Equipment used: Rolling walker (2 wheels) Transfers: Sit to/from Stand, Bed to chair/wheelchair/BSC Sit to Stand: Min assist           General transfer comment: minA to power up, increased time to achieve full upright posture due to abdominal soreness    Ambulation/Gait Ambulation/Gait assistance: Contact guard assist Gait Distance (Feet):  175 Feet Assistive device: Rolling walker (2 wheels) Gait Pattern/deviations: Step-through pattern, Decreased stride length, Trunk flexed   Gait velocity interpretation: 1.31 - 2.62 ft/sec, indicative of limited community ambulator   General Gait Details: cues for posture, proximity to RW and direction   Stairs             Wheelchair Mobility     Tilt Bed    Modified Rankin (Stroke Patients Only)       Balance Overall balance assessment: Needs assistance Sitting-balance support: Single extremity supported, No upper extremity supported, Feet supported Sitting balance-Leahy Scale: Fair Sitting balance - Comments: seated EOB, no overt LOB   Standing balance support: Bilateral upper extremity supported, During functional activity, Reliant on assistive device for balance Standing balance-Leahy Scale: Poor Standing balance comment: reliant on RW                            Communication Communication Communication: No apparent difficulties  Cognition Arousal: Alert Behavior During Therapy: WFL for tasks assessed/performed   PT - Cognitive impairments: No apparent impairments                         Following commands: Intact      Cueing Cueing Techniques: Verbal cues, Gestural cues  Exercises      General Comments General comments (skin integrity, edema, etc.): VSS      Pertinent Vitals/Pain Pain Assessment Pain Assessment: Faces Faces Pain Scale: Hurts little more    Home Living  Prior Function            PT Goals (current goals can now be found in the care plan section) Acute Rehab PT Goals Patient Stated Goal: return home PT Goal Formulation: With patient/family Time For Goal Achievement: 08/17/24 Potential to Achieve Goals: Good Progress towards PT goals: Progressing toward goals    Frequency    Min 2X/week      PT Plan      Co-evaluation              AM-PAC PT 6  Clicks Mobility   Outcome Measure  Help needed turning from your back to your side while in a flat bed without using bedrails?: A Little Help needed moving from lying on your back to sitting on the side of a flat bed without using bedrails?: A Little Help needed moving to and from a bed to a chair (including a wheelchair)?: A Little Help needed standing up from a chair using your arms (e.g., wheelchair or bedside chair)?: A Little Help needed to walk in hospital room?: A Little Help needed climbing 3-5 steps with a railing? : A Lot 6 Click Score: 17    End of Session   Activity Tolerance: Patient tolerated treatment well Patient left: with call bell/phone within reach;in bed Nurse Communication: Mobility status PT Visit Diagnosis: Other abnormalities of gait and mobility (R26.89);Difficulty in walking, not elsewhere classified (R26.2)     Time: 8476-8467 PT Time Calculation (min) (ACUTE ONLY): 9 min  Charges:    $Gait Training: 8-22 mins PT General Charges $$ ACUTE PT VISIT: 1 Visit                     Norene Ames, PT, DPT Acute Rehabilitation Services Secure chat preferred Office #: [phone]    Norene CHRISTELLA Ames 08/06/2024, 3:46 PM

## 2024-08-06 NOTE — Progress Notes (Signed)
 OT Cancellation Note  Patient Details Name: Donelle Baba MRN: [id number] DOB: 12-14-1945   Cancelled Treatment:    Reason Eval/Treat Not Completed: Fatigue/lethargy limiting ability to participate (PT up all morning, then worked with PT. Will follow up tomorrow.)  [facility] 08/06/2024, 4:46 PM Kreg Sink, OT/L   Acute OT Clinical Specialist Acute Rehabilitation Services Pager [phone] Office [phone]

## 2024-08-06 NOTE — Progress Notes (Addendum)
 "   4 Days Post-Op  Subjective: CC: No acute events.  Epidural out.  Minimal pain. Tolerating fulls.  Has had several BMs.    Objective: Vital signs in last 24 hours: Temp:  [97.8 F (36.6 C)-99.6 F (37.6 C)] 98.7 F (37.1 C) (01/26 1113) Pulse Rate:  [81-92] 84 (01/26 1113) Resp:  [16-29] 18 (01/26 1113) BP: (117-139)/(69-94) 137/78 (01/26 1113) SpO2:  [94 %-98 %] 94 % (01/26 1113) Last BM Date : 08/06/24  Intake/Output from previous day: 01/25 0701 - 01/26 0700 In: 752.6 [P.O.:300; IV Piggyback:353.2] Out: 2770 [Urine:2650; Drains:120] Intake/Output this shift: Total I/O In: 120 [P.O.:120] Out: -   PE: Gen:  Alert, NAD, pleasant Card:  RRR Pulm:  CTAB, no W/R/R, effort normal Abd: Soft, no distension, appropriately ttp around his incision, midline wound with staples in place. Drains serosang.  Ext:  No LE edema  Psych: A&Ox3    Lab Results:  Recent Labs    08/05/24 0510 08/06/24 0331  WBC 9.0 9.5  HGB 9.5* 9.7*  HCT 29.0* 28.4*  PLT 185 216   BMET Recent Labs    08/05/24 0510 08/06/24 0331  NA 139 138  K 3.6 3.9  CL 107 106  CO2 23 22  GLUCOSE 102* 101*  BUN 6* 9  CREATININE 0.80 0.81  CALCIUM 8.3* 8.2*   PT/INR Recent Labs    08/04/24 0808  LABPROT 16.5*  INR 1.3*   CMP     Component Value Date/Time   NA 138 08/06/2024 0331   K 3.9 08/06/2024 0331   CL 106 08/06/2024 0331   CO2 22 08/06/2024 0331   GLUCOSE 101 (H) 08/06/2024 0331   BUN 9 08/06/2024 0331   CREATININE 0.81 08/06/2024 0331   CREATININE 1.03 04/20/2024 1402   CALCIUM 8.2 (L) 08/06/2024 0331   PROT 6.0 (L) 08/06/2024 0331   ALBUMIN  3.0 (L) 08/06/2024 0331   AST 22 08/06/2024 0331   ALT 22 08/06/2024 0331   ALKPHOS 81 08/06/2024 0331   BILITOT 0.8 08/06/2024 0331   GFRNONAA >60 08/06/2024 0331   GFRNONAA >60 04/20/2024 1402   Lipase     Component Value Date/Time   LIPASE 18 05/16/2024 1645    Studies/Results: No results  found.  Anti-infectives: Anti-infectives (From admission, onward)    Start     Dose/Rate Route Frequency Ordered Stop   08/02/24 2200  ceFAZolin  (ANCEF ) IVPB 2g/100 mL premix        2 g 200 mL/hr over 30 Minutes Intravenous Every 8 hours 08/02/24 1728 08/02/24 2350   08/02/24 0745  ceFAZolin  (ANCEF ) IVPB 2g/100 mL premix        2 g 200 mL/hr over 30 Minutes Intravenous On call to O.R. 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 soft pancreatic tissue. 12 mm common bile duct. 4 mm pancreatic duct. Several duodenal diverticula were seen and resected.   - Path pending  - Cont JP x 2. Pull #1 tomorrow if still looks ok.  - Mobilize, PT - Pulm toilet  FEN - Adv to soft diet. Calorie counts.  VTE - SCDs, SQH ID - Ancef  peri-op, none currently Foley - out, voiding spontaneously Plan - As above  HTN - PRN meds   LOS: 4 days   Jina LITTIE [name], MD, FACS, FSSO  Surgical Oncology, General Surgery, Trauma and Critical Care [facility] [place], [place] [phone] for weekday/non holidays Check [email or web address] for coverage night/weekend/holidays under General Surgery     08/06/2024, 11:37 AM Please see Amion for pager number during day hours 7:00am-4:30pm  "

## 2024-08-06 NOTE — Progress Notes (Signed)
 Initial Nutrition Assessment  DOCUMENTATION CODES:   Not applicable  INTERVENTION:  48 hr calorie count initiated:  Please hang calorie count envelope on the patient's door. Document percent consumed for each item on the patient's meal tray ticket and keep in envelope. Also document percent of any supplement or snack pt consumes and keep documentation in envelope for RD to review.  Added Ensure Plus High Protein po BID, each supplement provides 350 kcal and 20 grams of protein.  Added Magic cup TID with meals, each supplement provides 290 kcal and 9 grams of protein  Encourage adequate PO intake that will meet pt's increased calorie and protein needs for optimal post op recovery  NUTRITION DIAGNOSIS:   Increased nutrient needs related to cancer and cancer related treatments, post-op healing as evidenced by estimated needs.  GOAL:   Patient will meet greater than or equal to 90% of their needs  MONITOR:   PO intake, Supplement acceptance, Diet advancement  REASON FOR ASSESSMENT:   Consult Assessment of nutrition requirement/status, Calorie Count  ASSESSMENT:   Pt with hx of HTN, hepatitis C, and bile duct cancer. Admitted for scheduled whipple procedure.  1/22 admitted, s/p whipple, JP Drains placed x 2, NGT placed  1/24 advanced to CLD 1/25 advanced to FLD 1/26 advanced to soft diet  RD working remotely at time of assessment. Obtained information per chart review.  Pt has been tolerating diet advancements per surgery, now advanced to soft diet. Surgery requests calorie count to be initiated to assess if PO intake is meeting pt's estimated nutrition needs. Discussed calorie count with nursing and requested envelop to be placed on pt's door. Will monitor and evaluate meal intake.  Diet summary documentation shows 100% intake of breakfast this morning while on FLD. Will add ONS to support pt's PO intake.   Pt may be at risk for pancreatic insufficiency temporarily while  recovering from surgery. Currently having type 6 bowel movements, but if diarrhea persists will discuss possible need for Creon at meals if MD suspects EPI. Will continue to monitor bowel movements.   Per chart review, pt has lost 4% weight in the last 4 months and 8% in the last 10 months which is not clinically significant but should continue to be monitored. Unable to obtain diet hx at this time but will collect at follow up.  Average Meal Completion: 1/26: 100% average intake x 1 recorded meals (while on FLD)  Nutritionally Relevant Medications: Colace BID Protonix   Labs reviewed: Potassium 3.9<--3.6<--3.4 A1c 5.7  Admit weight: 81.6 kg  NUTRITION - FOCUSED PHYSICAL EXAM:  Deferred to follow up  Diet Order:   Diet Order             DIET SOFT Room service appropriate? Yes; Fluid consistency: Thin  Diet effective now                   EDUCATION NEEDS:   No education needs have been identified at this time  Skin:  Skin Assessment: Skin Integrity Issues: Skin Integrity Issues:: Incisions Incisions: abdomen  Last BM:  1/26 type 6  Height:   Ht Readings from Last 1 Encounters:  08/02/24 6' (1.829 m)    Weight:   Wt Readings from Last 1 Encounters:  08/02/24 81.6 kg    Ideal Body Weight:  80.91 kg  BMI:  Body mass index is 24.41 kg/m.  Estimated Nutritional Needs:   Kcal:  1900-2100  Protein:  100-120g  Fluid:  1.9-2L  Josette Glance, MS, RDN, LDN Clinical Dietitian I Please reach out via secure chat

## 2024-08-06 NOTE — Progress Notes (Signed)
 Pt is POD #4 s/p Whipple.  Pain controlled well.  Epidural infusion has been off since yesterday.  As requested by surgery service, the epidural was removed with the tip intact.  Insertion site appears clean and dry without erythema.  May continue SQ Heparin  as is currently scheduled without interruption.

## 2024-08-07 LAB — COMPREHENSIVE METABOLIC PANEL WITH GFR
ALT: 21 U/L (ref 0–44)
AST: 19 U/L (ref 15–41)
Albumin: 3.2 g/dL — ABNORMAL LOW (ref 3.5–5.0)
Alkaline Phosphatase: 88 U/L (ref 38–126)
Anion gap: 10 (ref 5–15)
BUN: 14 mg/dL (ref 8–23)
CO2: 24 mmol/L (ref 22–32)
Calcium: 8.4 mg/dL — ABNORMAL LOW (ref 8.9–10.3)
Chloride: 105 mmol/L (ref 98–111)
Creatinine, Ser: 0.78 mg/dL (ref 0.61–1.24)
GFR, Estimated: >60 mL/min
Glucose, Bld: 122 mg/dL — ABNORMAL HIGH (ref 70–99)
Potassium: 3.9 mmol/L (ref 3.5–5.1)
Sodium: 139 mmol/L (ref 135–145)
Total Bilirubin: 0.7 mg/dL (ref 0.0–1.2)
Total Protein: 6.3 g/dL — ABNORMAL LOW (ref 6.5–8.1)

## 2024-08-07 LAB — CBC
HCT: 29.5 % — ABNORMAL LOW (ref 39.0–52.0)
Hemoglobin: 9.7 g/dL — ABNORMAL LOW (ref 13.0–17.0)
MCH: 28.6 pg (ref 26.0–34.0)
MCHC: 32.9 g/dL (ref 30.0–36.0)
MCV: 87 fL (ref 80.0–100.0)
Platelets: 251 10*3/uL (ref 150–400)
RBC: 3.39 MIL/uL — ABNORMAL LOW (ref 4.22–5.81)
RDW: 13.2 % (ref 11.5–15.5)
WBC: 8.9 10*3/uL (ref 4.0–10.5)
nRBC: 0 % (ref 0.0–0.2)

## 2024-08-07 LAB — SURGICAL PATHOLOGY

## 2024-08-07 NOTE — Progress Notes (Signed)
 Occupational Therapy Treatment Patient Details Name: Andrew Douglas MRN: [id number] DOB: 03-16-1946 Today's Date: 08/07/2024   History of present illness 79 yo M adm 08/02/24 for Whipple procedure due to cholangiocarcinoma. PMHx: cholecystitis, hep C, HTN   OT comments  Pt making steady progress. Educated pt on use of compensatory strategies, DME and AE to assist with LB ADL tasks. Pt states his wife will also be able to assist as needed as she is taking time off from work. Educated pt on importance of getting in/out of flat bed suing sidelying<>sit technique to follow abdominal precautions rather than elevating HOB when exiting Pt verbalized understanding. Acute OT to follow to facilitate safe DC home.       If plan is discharge home, recommend the following:  A little help with walking and/or transfers;A lot of help with bathing/dressing/bathroom;Assistance with cooking/housework;Help with stairs or ramp for entrance;Assist for transportation   Equipment Recommendations  BSC/3in1    Recommendations for Other Services      Precautions / Restrictions Precautions Precautions: Fall;Other (comment) Recall of Precautions/Restrictions: Intact Precaution/Restrictions Comments: abdominal precautions; JP drain       Mobility Bed Mobility   Bed Mobility: Rolling, Sidelying to Sit, Sit to Sidelying Rolling: Supervision Sidelying to sit: Contact guard assist     Sit to sidelying: Min assist General bed mobility comments: Pt has been exiting bed with HOB elevated. Educated on need to practice sidelying<>sit to follow abdominal precautions; pt verbalized understanding    Transfers Overall transfer level: Needs assistance Equipment used: Rolling walker (2 wheels) Transfers: Sit to/from Stand Sit to Stand: Supervision                 Balance     Sitting balance-Leahy Scale: Good       Standing balance-Leahy Scale: Fair                             ADL either  performed or assessed with clinical judgement   ADL Overall ADL's : Needs assistance/impaired Eating/Feeding: Modified independent   Grooming: Set up   Upper Body Bathing: Set up   Lower Body Bathing: Minimal assistance;Sit to/from stand       Lower Body Dressing: Minimal assistance;Sit to/from stand       Toileting- Architect and Hygiene: Supervision/safety       Functional mobility during ADLs: Contact guard assist General ADL Comments: Able to complete figure four positioning; educated on use of a reacher to use if needed for LB ADL; recommend use of long handled sponge when bathing; Recommend using a 3in1 over the toilet and as a shower chair ; states wife will be able to assist with ADL tasks    Extremity/Trunk Assessment              Vision       Perception     Praxis     Communication     Cognition Arousal: Alert Behavior During Therapy: [facility] for tasks assessed/performed Cognition: No apparent impairments                                        Cueing      Exercises      Shoulder Instructions       General Comments      Pertinent Vitals/ Pain       Pain Assessment  Pain Assessment: Faces Faces Pain Scale: Hurts a little bit Pain Location: abdomen Pain Descriptors / Indicators: Discomfort Pain Intervention(s): Limited activity within patient's tolerance  Home Living                                          Prior Functioning/Environment              Frequency  Min 2X/week        Progress Toward Goals  OT Goals(current goals can now be found in the care plan section)  Progress towards OT goals: Progressing toward goals  Acute Rehab OT Goals Patient Stated Goal: home OT Goal Formulation: With patient Time For Goal Achievement: 08/17/24 Potential to Achieve Goals: Good ADL Goals Pt Will Perform Grooming: with modified independence;standing Pt Will Perform Lower Body Bathing: with  modified independence;with adaptive equipment;sitting/lateral leans;sit to/from stand Pt Will Perform Lower Body Dressing: with modified independence;with adaptive equipment;sitting/lateral leans;sit to/from stand Pt Will Transfer to Toilet: with modified independence;ambulating;regular height toilet;grab bars Pt Will Perform Toileting - Clothing Manipulation and hygiene: with modified independence;sitting/lateral leans;sit to/from stand Additional ADL Goal #1: Pt will recall and maintain general abdominal precautions during ADLs/functional mobility to enhance occupational performance.  Plan      Co-evaluation                 AM-PAC OT 6 Clicks Daily Activity     Outcome Measure   Help from another person eating meals?: None Help from another person taking care of personal grooming?: A Little Help from another person toileting, which includes using toliet, bedpan, or urinal?: A Little Help from another person bathing (including washing, rinsing, drying)?: A Little Help from another person to put on and taking off regular upper body clothing?: A Little Help from another person to put on and taking off regular lower body clothing?: A Little 6 Click Score: 19    End of Session    OT Visit Diagnosis: Other abnormalities of gait and mobility (R26.89);Pain Pain - part of body:  (abdomen)   Activity Tolerance Patient tolerated treatment well   Patient Left in bed;with call bell/phone within reach   Nurse Communication Mobility status        Time: 8494-8478 OT Time Calculation (min): 16 min  Charges: OT General Charges $OT Visit: 1 Visit OT Treatments $Self Care/Home Management : 8-22 mins  Kreg Sink, OT/L   Acute OT Clinical Specialist Acute Rehabilitation Services Pager [phone] Office [phone]   [facility] 08/07/2024, 3:29 PM

## 2024-08-07 NOTE — TOC Initial Note (Signed)
 Transition of Care (TOC) - Initial/Assessment Note  Rayfield Gobble RN,BSN Inpatient Care Management Unit 4NP (Non Trauma)- RN Case Manager See Treatment Team for direct Phone #   Patient Details  Name: Andrew Douglas MRN: [id number] Date of Birth: 05/29/1946  Transition of Care [facility]) CM/SW Contact:    Gobble Rayfield Hurst, RN Phone Number: 08/07/2024, 11:31 AM  Clinical Narrative:                 HH orders placed for PT/OT- referral sent out in Hub.   CM spoke with pt at bedside- discussed HH orders and choice offered for [facility] as they responded in Hub that they could service.  Pt voiced he is not sure he will need HH f/u and would like to speak with his wife and think about it- CM to follow up prior to discharge  DME needs reviewed- pt voiced he would like a RW for home. CM will have DME ordered and refer out for agency to deliver prior to discharge.   Family to transport home.   Expected Discharge Plan: Home/Self Care Barriers to Discharge: Continued Medical Work up   Patient Goals and CMS Choice Patient states their goals for this hospitalization and ongoing recovery are:: wants to return home and recover   Choice offered to / list presented to : Patient      Expected Discharge Plan and Services   Discharge Planning Services: CM Consult Post Acute Care Choice: Durable Medical Equipment, Home Health Living arrangements for the past 2 months: Single Family Home                 DME Arranged: Walker rolling DME Agency: [facility] Date DME Agency Contacted: 08/07/24   Representative spoke with at DME Agency: [facility] HH Arranged: PT, OT HH Agency: [facility] Date HH Agency Contacted: 08/06/24   Representative spoke with at [facility] Agency: [facility]  Prior Living Arrangements/Services Living arrangements for the past 2 months: Single Family Home Lives with:: Spouse Patient language and need for interpreter reviewed:: Yes        Need for  Family Participation in Patient Care: Yes (Comment) Care giver support system in place?: Yes (comment)   Criminal Activity/Legal Involvement Pertinent to Current Situation/Hospitalization: No - Comment as needed  Activities of Daily Living      Permission Sought/Granted Permission sought to share information with : Facility Industrial/product Designer granted to share information with : Yes, Verbal Permission Granted     Permission granted to share info w AGENCY: DME        Emotional Assessment Appearance:: Appears stated age Attitude/Demeanor/Rapport: Engaged Affect (typically observed): Appropriate Orientation: : Oriented to Self, Oriented to Place, Oriented to  Time, Oriented to Situation Alcohol / Substance Use: Not Applicable Psych Involvement: No (comment)  Admission diagnosis:  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] Patient Active Problem List   Diagnosis Date Noted   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 [place] [address] Phone: [phone] Fax: [phone]     Social Drivers of Health (SDOH) Social History: SDOH Screenings   Food Insecurity: No Food Insecurity (08/05/2024)  Housing: Unknown (08/05/2024)  Transportation Needs: No Transportation Needs (08/05/2024)  Utilities: Not At Risk  (08/05/2024)  Depression (PHQ2-9): Low Risk (04/20/2024)  Financial Resource Strain: Low Risk (04/19/2022)   Received from [facility]  Social Connections: Moderately Isolated (08/05/2024)  Tobacco Use: Medium Risk (07/27/2024)   SDOH Interventions:     Readmission Risk Interventions    05/22/2024    9:34 AM 10/03/2023   10:35 AM  Readmission Risk Prevention Plan  Post Dischage Appt  Complete  Medication Screening  Complete  Transportation Screening Complete Complete  PCP or Specialist Appt within 3-5 Days Complete   HRI or Home Care Consult Complete   Social Work Consult for Recovery Care Planning/Counseling Complete   Palliative Care Screening Not Applicable   Medication Review Oceanographer) Referral to Pharmacy

## 2024-08-07 NOTE — Progress Notes (Signed)
 "   5 Days Post-Op  Subjective: CC: Pt continues to deny pain.  Getting up and about.  Tolerated some soft food, but said the chicken was too dry.    Objective: Vital signs in last 24 hours: Temp:  [98.2 F (36.8 C)-98.7 F (37.1 C)] 98.2 F (36.8 C) (01/27 0700) Pulse Rate:  [79-94] 87 (01/27 0700) Resp:  [16-22] 17 (01/27 0700) BP: (115-137)/(72-91) 125/72 (01/27 0700) SpO2:  [94 %-96 %] 96 % (01/27 0700) Last BM Date : 08/06/24  Intake/Output from previous day: 01/26 0701 - 01/27 0700 In: 480 [P.O.:480] Out: 810 [Urine:700; Drains:110] Intake/Output this shift: Total I/O In: -  Out: 300 [Urine:300]  PE: Gen:  Alert, NAD, pleasant. In recliner cleaning up. Card:  RRR Pulm:  no distress. Abd: Soft, no distension, appropriately ttp around his incision, midline wound with staples in place. Drains serosang.  Ext:  No LE edema  Psych: A&Ox3    Lab Results:  Recent Labs    08/06/24 0331 08/07/24 0525  WBC 9.5 8.9  HGB 9.7* 9.7*  HCT 28.4* 29.5*  PLT 216 251   BMET Recent Labs    08/06/24 0331 08/07/24 0525  NA 138 139  K 3.9 3.9  CL 106 105  CO2 22 24  GLUCOSE 101* 122*  BUN 9 14  CREATININE 0.81 0.78  CALCIUM 8.2* 8.4*   PT/INR No results for input(s): LABPROT, INR in the last 72 hours.  CMP     Component Value Date/Time   NA 139 08/07/2024 0525   K 3.9 08/07/2024 0525   CL 105 08/07/2024 0525   CO2 24 08/07/2024 0525   GLUCOSE 122 (H) 08/07/2024 0525   BUN 14 08/07/2024 0525   CREATININE 0.78 08/07/2024 0525   CREATININE 1.03 04/20/2024 1402   CALCIUM 8.4 (L) 08/07/2024 0525   PROT 6.3 (L) 08/07/2024 0525   ALBUMIN  3.2 (L) 08/07/2024 0525   AST 19 08/07/2024 0525   ALT 21 08/07/2024 0525   ALKPHOS 88 08/07/2024 0525   BILITOT 0.7 08/07/2024 0525   GFRNONAA >60 08/07/2024 0525   GFRNONAA >60 04/20/2024 1402   Lipase     Component Value Date/Time   LIPASE 18 05/16/2024 1645    Studies/Results: No results  found.  Anti-infectives: Anti-infectives (From admission, onward)    Start     Dose/Rate Route Frequency Ordered Stop   08/02/24 2200  ceFAZolin  (ANCEF ) IVPB 2g/100 mL premix        2 g 200 mL/hr over 30 Minutes Intravenous Every 8 hours 08/02/24 1728 08/02/24 2350   08/02/24 0745  ceFAZolin  (ANCEF ) IVPB 2g/100 mL premix        2 g 200 mL/hr over 30 Minutes Intravenous On call to O.R. 08/02/24 0731 08/02/24 0954        Assessment/Plan POD 5 s/p diagnostic laparoscopy, SBR, Classic pancreaticoduodenectomy, Placement of pancreatic duct stent by Dr. 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 soft pancreatic tissue. 12 mm common bile duct. 4 mm pancreatic duct. Several duodenal diverticula were seen and resected.   - Path pT2N1 cholangiocarcinoma, negative margins. Will refer back to oncology post op  - Cont JP x 2. Pull #1 today.  - Mobilize, PT - Pulm toilet  FEN - Calorie counts.  VTE - SCDs, SQH ID - Ancef  peri-op, none currently Foley - out, voiding spontaneously Plan - As above  HTN - PRN meds   LOS: 5 days   Yasmina Chico L  [name], MD, FACS, FSSO Surgical Oncology, General Surgery, Trauma and Critical [facility], [place] [phone] for weekday/non holidays Check [email or web address] for coverage night/weekend/holidays under General Surgery     08/07/2024, 8:51 AM Please see Amion for pager number during day hours 7:00am-4:30pm  "

## 2024-08-07 NOTE — Plan of Care (Signed)

## 2024-08-07 NOTE — Progress Notes (Signed)
 Calorie Count Note  48 hour calorie count ordered. Day 1 results outlined below. Pt's PO intake meets 74% of kcal and 56% of protein needs. Suspect pt will continue to eat well with the help of supplements. Will continue calorie count to assess if PO intake remains consistent.   Diet: Soft Supplements: Ensure, magic cups  Estimated Nutritional Needs: Kcal:  1900-2100 Protein:  100-120g   Day 1: Breakfast: 100% grits, ice cream, milk, juice (460 kcal, 12 g protein) Lunch: 100% broccoli, mac and cheese, magic cup; 50% meat loaf (604 kcal, 24 g protein) Dinner: 0% Supplements: 100% 1 Ensure (350 kcal, 20 g protein)  Total intake: 1414 kcal (74% of minimum estimated needs)  56 protein (56% of minimum estimated needs)    INTERVENTION:  Continue 48 hr calorie count initiated:    Continue Ensure Plus High Protein po BID, each supplement provides 350 kcal and 20 grams of protein.   Continue Magic cup TID with meals, each supplement provides 290 kcal and 9 grams of protein   Encourage adequate PO intake that will meet pt's increased calorie and protein needs for optimal post op recovery   NUTRITION DIAGNOSIS:    Increased nutrient needs related to cancer and cancer related treatments, post-op healing as evidenced by estimated needs.  Remains applicable  GOAL:    Patient will meet greater than or equal to 90% of their needs Progressing   Josette Glance, MS, RDN, LDN Clinical Dietitian I Please reach out via secure chat

## 2024-08-08 ENCOUNTER — Other Ambulatory Visit: Payer: Self-pay

## 2024-08-08 ENCOUNTER — Encounter (HOSPITAL_COMMUNITY): Payer: Self-pay | Admitting: General Surgery

## 2024-08-08 LAB — CBC
HCT: 29.4 % — ABNORMAL LOW (ref 39.0–52.0)
Hemoglobin: 9.7 g/dL — ABNORMAL LOW (ref 13.0–17.0)
MCH: 28.8 pg (ref 26.0–34.0)
MCHC: 33 g/dL (ref 30.0–36.0)
MCV: 87.2 fL (ref 80.0–100.0)
Platelets: 278 10*3/uL (ref 150–400)
RBC: 3.37 MIL/uL — ABNORMAL LOW (ref 4.22–5.81)
RDW: 13.2 % (ref 11.5–15.5)
WBC: 10.9 10*3/uL — ABNORMAL HIGH (ref 4.0–10.5)
nRBC: 0 % (ref 0.0–0.2)

## 2024-08-08 NOTE — Progress Notes (Signed)
 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 PMHx: cholecystitis, hep C, HTN    PT Comments  Pt progressing well towards all goals. Pt with improved ambulation tolerance and ability to maintain upright posture during amb. No rest breaks required on 400' distance. Acute PT to cont to follow.    If plan is discharge home, recommend the following: A little help with walking and/or transfers;A little help with bathing/dressing/bathroom;Assistance with cooking/housework;Assist for transportation   Can travel by private vehicle        Equipment Recommendations  Rolling walker (2 wheels)    Recommendations for Other Services       Precautions / Restrictions Precautions Precautions: Fall;Other (comment) Recall of Precautions/Restrictions: Intact Precaution/Restrictions Comments: abdominal precautions; JP drain Restrictions Weight Bearing Restrictions Per Provider Order: No     Mobility  Bed Mobility Overal bed mobility: Needs Assistance Bed Mobility: Rolling, Sidelying to Sit, Sit to Sidelying Rolling: Supervision Sidelying to sit: Contact guard assist       General bed mobility comments: pt with improved ability to roll to the R and push up on R elbow    Transfers Overall transfer level: Needs assistance Equipment used: Rolling walker (2 wheels) Transfers: Sit to/from Stand Sit to Stand: Contact guard assist           General transfer comment: verbal cues to push up from bed not pull up on walker, bed height elevated    Ambulation/Gait Ambulation/Gait assistance: Contact guard assist Gait Distance (Feet): 400 Feet Assistive device: Rolling walker (2 wheels) Gait Pattern/deviations: Step-through pattern, Decreased stride length, Trunk flexed Gait velocity: improved from previous  sessions Gait velocity interpretation: 1.31 - 2.62 ft/sec, indicative of limited community ambulator   General Gait Details: cues for posture, proximity to RW and direction   Stairs             Wheelchair Mobility     Tilt Bed    Modified Rankin (Stroke Patients Only)       Balance Overall balance assessment: Needs assistance Sitting-balance support: Single extremity supported, No upper extremity supported, Feet supported Sitting balance-Leahy Scale: Good Sitting balance - Comments: seated EOB, no overt LOB   Standing balance support: Bilateral upper extremity supported, During functional activity, Reliant on assistive device for balance Standing balance-Leahy Scale: Fair Standing balance comment: reliant on RW                            Communication Communication Communication: No apparent difficulties  Cognition Arousal: Alert Behavior During Therapy: WFL for tasks assessed/performed   PT - Cognitive impairments: No apparent impairments                         Following commands: Intact      Cueing Cueing Techniques: Verbal cues, Gestural cues  Exercises      General Comments General comments (skin integrity, edema, etc.): VSS      Pertinent Vitals/Pain Pain Assessment Pain Assessment: Faces Faces Pain Scale: Hurts a little bit Pain Location: abdomen Pain Descriptors / Indicators: Discomfort    Home Living                          Prior Function  PT Goals (current goals can now be found in the care plan section) Acute Rehab PT Goals Patient Stated Goal: return home PT Goal Formulation: With patient/family Time For Goal Achievement: 08/17/24 Potential to Achieve Goals: Good Progress towards PT goals: Progressing toward goals    Frequency    Min 2X/week      PT Plan      Co-evaluation              AM-PAC PT 6 Clicks Mobility   Outcome Measure  Help needed turning from your  back to your side while in a flat bed without using bedrails?: A Little Help needed moving from lying on your back to sitting on the side of a flat bed without using bedrails?: A Little Help needed moving to and from a bed to a chair (including a wheelchair)?: A Little Help needed standing up from a chair using your arms (e.g., wheelchair or bedside chair)?: A Little Help needed to walk in hospital room?: A Little Help needed climbing 3-5 steps with a railing? : A Lot 6 Click Score: 17    End of Session   Activity Tolerance: Patient tolerated treatment well Patient left: with call bell/phone within reach;in bed Nurse Communication: Mobility status PT Visit Diagnosis: Other abnormalities of gait and mobility (R26.89);Difficulty in walking, not elsewhere classified (R26.2)     Time: 8841-8784 PT Time Calculation (min) (ACUTE ONLY): 17 min  Charges:    $Gait Training: 8-22 mins PT General Charges $$ ACUTE PT VISIT: 1 Visit                     Norene Ames, PT, DPT Acute Rehabilitation Services Secure chat preferred Office #: [phone]    Norene CHRISTELLA Ames 08/08/2024, 1:40 PM

## 2024-08-08 NOTE — Progress Notes (Signed)
 Nutrition Follow Up  DOCUMENTATION CODES:   Non-severe (moderate) malnutrition in context of chronic illness  INTERVENTION:  Calorie count complete Day 2 results below: meeting > 100% of estimated calorie and 96% estimated protein needs  Continue Ensure Plus High Protein po BID, each supplement provides 350 kcal and 20 grams of protein.  Continue Magic cup TID with meals, each supplement provides 290 kcal and 9 grams of protein  Encourage adequate PO intake that will meet pt's increased calorie and protein needs for optimal post op recovery  NUTRITION DIAGNOSIS:   Moderate Malnutrition related to chronic illness as evidenced by mild fat depletion, moderate muscle depletion, severe muscle depletion. Diagnosis updated 1/28  GOAL:   Patient will meet greater than or equal to 90% of their needs Progressing  MONITOR:   PO intake, Supplement acceptance  REASON FOR ASSESSMENT:   Consult Assessment of nutrition requirement/status, Calorie Count  ASSESSMENT:   Pt with hx of HTN, hepatitis C, and bile duct cancer. Admitted for scheduled whipple procedure.  1/22 admitted, s/p whipple, JP Drains placed x 2, NGT placed  1/24 advanced to CLD 1/25 advanced to FLD 1/26 advanced to soft diet  Pt resting in bed at time of follow up. Pt reports appetite is very good and he eats most of his meals unless he receives dry chicken. Changed pt's ordering status to room service w/ assist as he missed ordering breakfast this morning. Calorie count day 2 results shows pt meeting > 100% calorie and 95% protein needs with PO intake. Pt accepting of Ensure supplements, recommend continuing once home to help continue to meet pt's increased needs for post op healing. Pt reports no GI discomforts at this time.  PTA, pt was eating well, states he ate 3 meals per day.  24 h recall:  Breakfast: eggs and bacon/ oatmeal/ grits Lunch: something light like chicken soup Dinner: steak/hamburger/chicken w/  creamed potatoes and a vegetable like string beans  Pt reports he was independent and did not use any assistive devices for mobility.   Nutrition focused physical exam shows mild fat depletions and moderate to severe muscle depletions, indicative of malnutrition. Suspect malnutrition has been ongoing issue since cancer diagnosis. Pt reports weight has been fairly stable but feels like he has lost some while admitted. Will continue to monitor.   Calorie Count Day 2 Results: Breakfast: 5% grits, eggs, muffin, bacon; 100% peaches, juice, milk, magic cup (534 kcal, 19 g protein) Lunch: 60% pot roast; 50% mashed potatoes; 100% broccoli, magic cup (487 kcal, 23 g protein) Dinner:  100% soup, rice, magic cup; 50% carrots; 0% chicken (479 kcal, 14 g protein) Supplements: 100% of 2 Ensures (700 kcal, 40 g protein)  Total Intake: 2200 kcal (>100% of minimum estimated needs) 96 g protein (95% of minimum estimated needs)  Nutritionally Relevant Medications: Colace BID Protonix   Labs reviewed  Admit weight: 81.6 kg  NUTRITION - FOCUSED PHYSICAL EXAM:  Flowsheet Row Most Recent Value  Orbital Region Mild depletion  Upper Arm Region Mild depletion  Thoracic and Lumbar Region Mild depletion  Buccal Region Mild depletion  Temple Region Severe depletion  Clavicle Bone Region Moderate depletion  Clavicle and Acromion Bone Region Moderate depletion  Scapular Bone Region Moderate depletion  Dorsal Hand Severe depletion  Patellar Region Moderate depletion  Anterior Thigh Region Moderate depletion  Posterior Calf Region Moderate depletion  Edema (RD Assessment) None  Hair Reviewed  Eyes Reviewed  Mouth Reviewed  Skin Reviewed  Nails Reviewed  Diet Order:   Diet Order             DIET SOFT Room service appropriate? Yes with Assist; Fluid consistency: Thin  Diet effective now                   EDUCATION NEEDS:   Education needs have been addressed  Skin:  Skin Assessment:  Skin Integrity Issues: Skin Integrity Issues:: Incisions Incisions: abdomen  Last BM:  1/28 type 6  Height:   Ht Readings from Last 1 Encounters:  08/02/24 6' (1.829 m)    Weight:   Wt Readings from Last 1 Encounters:  08/02/24 81.6 kg    Ideal Body Weight:  80.91 kg  BMI:  Body mass index is 24.41 kg/m.  Estimated Nutritional Needs:   Kcal:  1900-2100  Protein:  100-120g  Fluid:  1.9-2L    Josette Glance, MS, RDN, LDN Clinical Dietitian I Please reach out via secure chat

## 2024-08-08 NOTE — Discharge Instructions (Addendum)
 "               Whipple Surgery Nutrition Therapy  This surgery removes part of the pancreas, intestine, stomach, bile duct, and all of the gall bladder. As a result of the surgery, the amount of food you can eat at one time and how your body handles food will change. Goals To provide good nutrition after surgery by eating adequate amounts of a variety of foods To promote healing after surgery To prevent or decrease problems related to eating To prevent too much weight loss  Tips Eating Tips Eat small, frequent meals (5 to 6 meals per day). After surgery, you will feel full quickly and will be able to eat only small amounts at a time. Stop eating when you feel full. Eat slowly and chew your foods very well. Avoid foods that are known to cause you problems. Otherwise, eat the foods you like. Symptoms usually get better over time. Eat a variety of foods. To help with healing, eat foods high in protein, such as tender meats, poultry, fish, dairy products, eggs, peanut butter, and beans. Drink supplements such as Boost, Ensure, or [employer]. At first, you may have problems tolerating fatty foods. For the first few weeks, avoid drinking large amounts of fluid with meals. Small sips are OK. Drink most fluids 30 minutes before and after meals. Drink 48 to 64 ounces (6 to 8 cups) of fluid throughout the day. Try not to lose weight, even if you are overweight, because it can make you feel weaker and can delay healing. Your registered dietitian nutritionist can help you with ideas for maintaining your weight if needed. Possible Problems and Tips Stomach Empties Too Slowly after Eating This may occur in one-quarter to half of patients after surgery, and it usually gets better within a few weeks to months. Symptoms Nausea, vomiting of undigested food, bloating, early fullness, and abdominal pain Tips Eat small, frequent meals. Chew foods very well. Liquids may work  better than solids. Low-fat, low-fiber soft foods may work better than high-fat, high-fiber tough foods. Take a walk after eating to help move food through your system faster. Ask your doctor if you will need medicine to help your stomach empty faster. Dumping Syndrome Early symptoms may occur 30 to 60 minutes after eating and are caused when food from the stomach (especially sugar) passes too quickly into the intestine. Symptoms Dizziness, sweating, fast heart rate, bloating, nausea, and diarrhea (30 to 60 minutes after eating). Later symptoms (2 to 3 hours after a meal) include feelings of weakness, hunger, and fast heart rate. Tips Avoid foods high in sugar (not more than 12 grams of sugar per serving). Drink fluid 30 minutes before or after meals, not with meals. Eat 5 to 6 small meals per day. Lie down for up to 30 minutes after meals. Try foods high in soluble fiber, including apples, apricots, bananas, blackberries, nectarines, oranges, grapefruit, pears, plums, strawberries, tangerines, asparagus, broccoli, brussels sprouts, carrots, kale, okra, spinach, mustard greens, sweet potato, oatmeal, oat bran, beans (black, kidney, lima, navy), or commercial fiber supplements. Decreased Pancreas Function Caused by disease of the pancreas or the removal of part of the pancreas. Symptoms Changes in stools (foul smelling, oily, frothy, very light in color) and weight loss even though you are eating plenty of calories. Tips Take pancreatic enzymes per your doctors orders, just before meals and snacks. A low-fat diet is usually not needed if you are taking pancreatic enzymes. However, if  your doctor advises you to follow a low-fat diet to see if symptoms improve, be sure to get in enough calories to prevent weight loss. Diabetes Caused by decreased insulin production by the pancreas after surgery. Tips Monitor your blood glucose (sugar) as advised by your doctor Control your blood glucose  through diet and medicine as prescribed by your doctor. Your doctor, registered dietitian nutritionist, or diabetes educator will teach you about the diet that will be right for you. Low Nutrient Levels Caused by not eating enough nutrients or by not digesting or absorbing nutrients normally after surgery. Most common nutrients: iron, calcium zinc, copper, selenium, vitamins A, E, D, and K. Tips Eat a variety of foods daily. Take a daily vitamin/mineral supplement. Follow your doctors advice about taking extra supplements. Extra calcium (500 to 1,000 milligrams per day) and vitamin D (600 to 1,000 units per day) may be suggested by your doctor. Lactose Intolerance Caused by a decrease in the enzyme in your intestine that digests lactose. Symptoms Gas, bloating, or diarrhea after drinking milk or eating milk products Tips Try lactose-free milk choices (such as soy milk or almond milk). Try reduced-lactose products (such as Lactaid or Dairy Ease products). Try lactase enzyme tablets when you eat dairy products. Try yogurt or cheese instead of milk. Bacteria Growth in the Small Intestine Symptoms Nausea, gas, bloating, diarrhea, low vitamin B-12 and high folate in the blood. Tip Ask doctor if treatment with antibiotics will be needed.  Foods Recommended Tolerance to foods usually improves over time. You may eat foods that you like and can tolerate. Food Group Foods Recommended in the First Few Weeks  Dairy Milk products as tolerated lactose-free or lactose-reduced products, sugar-free yogurt, sugar-free pudding, cheese, sugar-free ice cream  Protein Tender/soft meat, poultry, beans, eggs, smooth peanut butter, cheese, cottage cheese  Grains Crackers, pasta, plain breads and rolls, pretzels, rice, unsweetened cereals  Vegetables Cooked vegetables, vegetable juice  Fruits Soft fresh fruit, fruit canned in natural juice, unsweetened fruit juice  Desserts Low-calorie gelatin, low-calorie  popsicles, sugar-free desserts  Beverages Noncarbonated/sugar-free or low sugar beverages, water , diluted fruit juice  Oral supplements No added sugar Carnation Instant Breakfast, Glucerna, Boost Glucose Control, Ensure, Boost (other options also available)  Condiments Salt, pepper, mild-flavored sauces and gravies, other spices as tolerated, artificial sweeteners, low-calorie jelly    Food Group Foods That May Cause Distress in the First Few Weeks  Dairy Cocoa mixes, regular ice cream, chocolate milk, sweetened custard or pudding, regular yogurt, milkshakes  Protein Fried meats, lunch meats, bologna, salami, sausage, hot dogs, bacon, tough/stringy meats, nuts, chunky peanut butter  Vegetables Raw vegetables or fried vegetables Cooked vegetables including beets, broccoli, brussels sprouts, cabbage, mustard and turnip greens, cauliflower, corn, potato skins  Fruits Tough fresh fruits, dried fruits, canned or frozen fruits in syrup, sweetened juice  Sweets Sugar-coated cereals, doughnuts, sweet rolls, regular popsicles, gelatin, high-sugar desserts, cake, pie, sherbet  Beverages Carbonated beverages (even diet) due to gas formation, regular soft drinks, sugared drink mixes, sugar-containing fruit-flavored beverages, sweetened iced tea or similar drinks, alcohol, regular coffee  Condiments Sugar, jam, jelly   Whipple Surgery Sample 1-Day Menu View Nutrient Info Breakfast 2 scrambled eggs Grated cheese, melted (on eggs) 1/2 cup oatmeal 1/2 cup applesauce  Morning Snack 8 ounces yogurt 1/2 cup sliced peaches  Lunch 1/2 cup blueberries 1/2 cup cottage cheese (low-fat)  Afternoon Snack 2 tablespoons peanut butter 1 small banana  Evening Meal 3 ounces baked chicken 1/2 cup mashed potatoes w/  gravy Gravy, beef, canned, ready-to-serve 1/2 cup mixed vegetables w/ margarine Margarine Spread, approximately 48% fat, tub  Evening Snack 1 cup pudding 5 vanilla wafers  Daily Sum Nutrient Unit Value   Macronutrients  Energy kcal 1546  Energy kJ 6476  Protein g 84  Total lipid (fat) g 55  Carbohydrate, by difference g 188  Fiber, total dietary g 16  Sugars, total g 110  Minerals  Calcium, Ca mg 998  Iron, Fe mg 13  Sodium, Na mg 1975  Vitamins  Vitamin C, total ascorbic acid mg 33  Vitamin A, IU IU 5756  Vitamin D IU 94  Lipids  Fatty acids, total saturated g 16  Fatty acids, total monounsaturated g 24  Fatty acids, total polyunsaturated g 10  Cholesterol mg 419     Whipple Surgery Vegan Sample 1-Day Menu View Nutrient Info Breakfast 1 slice whole wheat toast 1 tablespoon smooth almond butter  cup oatmeal 1 cup soymilk fortified with calcium, vitamin B12, and vitamin D  Morning Snack 6 ounces vanilla soy yogurt  cup applesauce  Lunch  cup kidney beans  cup brown rice 2 teaspoons olive oil  Afternoon Snack 1 banana 2 tablespoons smooth peanut butter  Evening Meal  cup tofu  cup mashed potatoes  cup cooked green beans 2 teaspoons olive oil 1 dinner roll  Evening Snack 1 cup soy nutrition supplement  Daily Sum Nutrient Unit Value  Macronutrients  Energy kcal 1676  Energy kJ 7010  Protein g 79  Total lipid (fat) g 64  Carbohydrate, by difference g 209  Fiber, total dietary g 25  Sugars, total g 74  Minerals  Calcium, Ca mg 1158  Iron, Fe mg 15  Sodium, Na mg 803  Vitamins  Vitamin C, total ascorbic acid mg 78  Vitamin A, IU IU 1014  Vitamin D IU 119  Lipids  Fatty acids, total saturated g 10  Fatty acids, total monounsaturated g 31  Fatty acids, total polyunsaturated g 16  Cholesterol mg 1     Whipple Surgery Vegetarian (Lacto-Ovo) Sample 1-Day Menu View Nutrient Info Breakfast 2 scrambled eggs  cup cheese 2 teaspoons olive oil  cup oatmeal 1 cup lactose-free milk  Morning Snack 6 ounces plain yogurt  cup applesauce  Lunch  cup low-fat cottage cheese  cup peaches  Afternoon Snack 1 banana 1 tablespoon smooth peanut butter   Evening Meal  cup tofu  cup mashed potatoes  cup cooked green beans 2 teaspoons olive oil  Evening Snack 1 cup nutrition supplement  Daily Sum Nutrient Unit Value  Macronutrients  Energy kcal 1601  Energy kJ 6693  Protein g 83  Total lipid (fat) g 67  Carbohydrate, by difference g 177  Fiber, total dietary g 14  Sugars, total g 95  Minerals  Calcium, Ca mg 1317  Iron, Fe mg 12  Sodium, Na mg 1164  Vitamins  Vitamin C, total ascorbic acid mg 85  Vitamin A, IU IU 3516  Vitamin D IU 390  Lipids  Fatty acids, total saturated g 16  Fatty acids, total monounsaturated g 30  Fatty acids, total polyunsaturated g 17  Cholesterol mg 376    Copyright 2020  Academy of [employer] Diet[employer]. All rights reserved    Nutrition [facility] Stay Proper nutrition can help your body recover from illness and injury.   Foods and beverages high in protein, vitamins, and minerals help rebuild muscle loss, promote healing, & reduce fall risk.   In addition  to eating healthy foods, a nutrition shake is an easy, delicious way to get the nutrition you need during and after your hospital stay  It is recommended that you continue to drink 2 bottles per day of:       Ensure for at least 1 month (30 days) after your hospital stay   Tips for adding a nutrition shake into your routine: As allowed, drink one with vitamins or medications instead of water  or juice Enjoy one as a tasty mid-morning or afternoon snack Drink cold or make a milkshake out of it Drink one instead of milk with cereal or snacks Use as a coffee creamer   Available at the following grocery stores and pharmacies:           * [place] [facility] * [employer] * [employer]  * [employer]          * [employer] * [employer]  * [facility]      * [employer]  * [employer]   * [facility]  * [employer]   * [facility] [phone]            For COUPONS visit: www.ensure.com/join or rolelink.com.br   Suggested  Substitutions Ensure Plus = Boost Plus = Carnation Breakfast Essentials = Boost Compact Ensure Active Clear = Boost Breeze Glucerna Shake = Boost Glucose Control = Carnation Breakfast Essentials SUGAR FREE     "

## 2024-08-08 NOTE — Progress Notes (Signed)
 "   6 Days Post-Op  Subjective: CC: Lateral drain pulled yesterday.  Doing ok.  Tolerating PO well.  Up to around 60-70% output already.    Objective: Vital signs in last 24 hours: Temp:  [97.8 F (36.6 C)-100.3 F (37.9 C)] 98.2 F (36.8 C) (01/28 0745) Pulse Rate:  [86-101] 86 (01/28 0745) Resp:  [15-21] 20 (01/28 0745) BP: (111-126)/(75-95) 112/75 (01/28 0745) SpO2:  [94 %-97 %] 96 % (01/28 0745) Last BM Date : 08/06/24  Intake/Output from previous day: 01/27 0701 - 01/28 0700 In: 657 [P.O.:657] Out: 1095 [Urine:1050; Drains:45] Intake/Output this shift: No intake/output data recorded.  PE: Gen:  Alert, NAD, pleasant.  Pulm:  no distress. Abd: Soft, no distension, appropriately ttp around his incision, midline wound with staples in place. Drain serosang Ext:  No LE edema  Psych: A&Ox3    Lab Results:  Recent Labs    08/06/24 0331 08/07/24 0525  WBC 9.5 8.9  HGB 9.7* 9.7*  HCT 28.4* 29.5*  PLT 216 251   BMET Recent Labs    08/06/24 0331 08/07/24 0525  NA 138 139  K 3.9 3.9  CL 106 105  CO2 22 24  GLUCOSE 101* 122*  BUN 9 14  CREATININE 0.81 0.78  CALCIUM 8.2* 8.4*   PT/INR No results for input(s): LABPROT, INR in the last 72 hours.  CMP     Component Value Date/Time   NA 139 08/07/2024 0525   K 3.9 08/07/2024 0525   CL 105 08/07/2024 0525   CO2 24 08/07/2024 0525   GLUCOSE 122 (H) 08/07/2024 0525   BUN 14 08/07/2024 0525   CREATININE 0.78 08/07/2024 0525   CREATININE 1.03 04/20/2024 1402   CALCIUM 8.4 (L) 08/07/2024 0525   PROT 6.3 (L) 08/07/2024 0525   ALBUMIN  3.2 (L) 08/07/2024 0525   AST 19 08/07/2024 0525   ALT 21 08/07/2024 0525   ALKPHOS 88 08/07/2024 0525   BILITOT 0.7 08/07/2024 0525   GFRNONAA >60 08/07/2024 0525   GFRNONAA >60 04/20/2024 1402   Lipase     Component Value Date/Time   LIPASE 18 05/16/2024 1645    Studies/Results: No results found.  Anti-infectives: Anti-infectives (From admission, onward)     Start     Dose/Rate Route Frequency Ordered Stop   08/02/24 2200  ceFAZolin  (ANCEF ) IVPB 2g/100 mL premix        2 g 200 mL/hr over 30 Minutes Intravenous Every 8 hours 08/02/24 1728 08/02/24 2350   08/02/24 0745  ceFAZolin  (ANCEF ) IVPB 2g/100 mL premix        2 g 200 mL/hr over 30 Minutes Intravenous On call to O.R. 08/02/24 0731 08/02/24 0954        Assessment/Plan POD 6 s/p diagnostic laparoscopy, SBR, Classic pancreaticoduodenectomy, Placement of pancreatic duct stent by Dr. 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 soft pancreatic tissue. 12 mm common bile duct. 4 mm pancreatic duct. Several duodenal diverticula were seen and resected.   - Path pT2N1 cholangiocarcinoma, negative margins. Will refer back to oncology post op  - Cont JP x 1.  - Mobilize, PT - Pulm toilet  FEN - Calorie count in progress  VTE - SCDs, SQH ID - Ancef  peri-op, none currently Foley - out, voiding spontaneously Plan - As above  Dispo:  hopefully home Friday.    HTN - PRN meds   LOS: 6 days   Jina LITTIE [name], MD, FACS, [facility] Surgical Oncology, General Surgery,  Trauma and Critical [facility] Surgery, [place] [phone] for weekday/non holidays Check [email or web address] for coverage night/weekend/holidays under General Surgery     08/08/2024, 8:57 AM Please see Amion for pager number during day hours 7:00am-4:30pm  "

## 2024-08-08 NOTE — Plan of Care (Signed)

## 2024-08-09 DIAGNOSIS — E440 Moderate protein-calorie malnutrition: Secondary | ICD-10-CM | POA: Insufficient documentation

## 2024-08-09 LAB — CBC
HCT: 28.4 % — ABNORMAL LOW (ref 39.0–52.0)
Hemoglobin: 9.3 g/dL — ABNORMAL LOW (ref 13.0–17.0)
MCH: 28.5 pg (ref 26.0–34.0)
MCHC: 32.7 g/dL (ref 30.0–36.0)
MCV: 87.1 fL (ref 80.0–100.0)
Platelets: 304 10*3/uL (ref 150–400)
RBC: 3.26 MIL/uL — ABNORMAL LOW (ref 4.22–5.81)
RDW: 13.2 % (ref 11.5–15.5)
WBC: 10.6 10*3/uL — ABNORMAL HIGH (ref 4.0–10.5)
nRBC: 0 % (ref 0.0–0.2)

## 2024-08-09 LAB — BASIC METABOLIC PANEL WITH GFR
Anion gap: 8 (ref 5–15)
BUN: 17 mg/dL (ref 8–23)
CO2: 27 mmol/L (ref 22–32)
Calcium: 8.6 mg/dL — ABNORMAL LOW (ref 8.9–10.3)
Chloride: 106 mmol/L (ref 98–111)
Creatinine, Ser: 0.85 mg/dL (ref 0.61–1.24)
GFR, Estimated: >60 mL/min
Glucose, Bld: 116 mg/dL — ABNORMAL HIGH (ref 70–99)
Potassium: 3.9 mmol/L (ref 3.5–5.1)
Sodium: 141 mmol/L (ref 135–145)

## 2024-08-09 MED ORDER — ACETAMINOPHEN 500 MG PO TABS: 1000.0000 mg | ORAL_TABLET | Freq: Four times a day (QID) | ORAL | Status: DC | PRN

## 2024-08-09 MED ORDER — PROCHLORPERAZINE EDISYLATE 10 MG/2ML IJ SOLN: 5.0000 mg | Freq: Four times a day (QID) | INTRAMUSCULAR | Status: DC | PRN

## 2024-08-09 MED ORDER — LIDOCAINE 5 % EX PTCH
1.0000 | MEDICATED_PATCH | CUTANEOUS | Status: DC
  Administered 2024-08-09: 1 via TRANSDERMAL
  Filled 2024-08-09: qty 1

## 2024-08-09 MED ORDER — TRAMADOL HCL 50 MG PO TABS
50.0000 mg | ORAL_TABLET | Freq: Four times a day (QID) | ORAL | Status: DC | PRN
  Administered 2024-08-09 (×2): 50 mg via ORAL
  Filled 2024-08-09 (×2): qty 1

## 2024-08-09 MED ORDER — DIPHENHYDRAMINE HCL 12.5 MG/5ML PO ELIX: 12.5000 mg | ORAL_SOLUTION | Freq: Four times a day (QID) | ORAL | Status: DC | PRN

## 2024-08-09 MED ORDER — PROCHLORPERAZINE MALEATE 10 MG PO TABS: 10.0000 mg | ORAL_TABLET | Freq: Four times a day (QID) | ORAL | Status: DC | PRN

## 2024-08-09 NOTE — Progress Notes (Signed)
 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 PMHx: cholecystitis, hep C, HTN   OT comments  Pt seen for OT treatment this AM, agreeable for visit. AOX4. Pt progressing well and is functioning at a level that is adequate for d/c from an OT standpoint. He was supervision for LB self-care via modified technique and for bed mobility/transfers. Ambulated community distance + RW with SBA. He demonstrates good safety awareness, understanding of his deficits, and abdominal precautions. VSS. All education completed and questions answered; acute OT to sign-off.      If plan is discharge home, recommend the following:  A little help with bathing/dressing/bathroom;Assistance with cooking/housework;Assist for transportation   Equipment Recommendations       Recommendations for Other Services      Precautions / Restrictions Precautions Precautions: Fall;Other (comment) Recall of Precautions/Restrictions: Intact Precaution/Restrictions Comments: abdominal precautions; JP drain Restrictions Weight Bearing Restrictions Per Provider Order: No       Mobility Bed Mobility Overal bed mobility: Needs Assistance Bed Mobility: Rolling, Sidelying to Sit, Sit to Sidelying Rolling: Supervision Sidelying to sit: Supervision     Sit to sidelying: Supervision General bed mobility comments: cued through log roll technique, incr effort to elevate trunk from HOB flat, did not use bed rails    Transfers Overall transfer level: Needs assistance Equipment used: Rolling walker (2 wheels) Transfers: Sit to/from Stand Sit to Stand: Supervision           General transfer comment: demo'd good hand placement relative to RW     Balance Overall balance assessment: Needs assistance Sitting-balance support: No upper extremity  supported, Feet supported Sitting balance-Leahy Scale: Good Sitting balance - Comments: seated EOB   Standing balance support: Bilateral upper extremity supported, During functional activity, Reliant on assistive device for balance Standing balance-Leahy Scale: Fair Standing balance comment: reliant on RW                           ADL either performed or assessed with clinical judgement   ADL Overall ADL's : Needs assistance/impaired Eating/Feeding: Independent   Grooming: Supervision/safety;Standing               Lower Body Dressing: Set up;Sitting/lateral leans Lower Body Dressing Details (indicate cue type and reason): cued for figure four technique Toilet Transfer: Supervision/safety;Ambulation;Regular Toilet;BSC/3in1;Rolling walker (2 wheels) (BSC frame placed over top of toilet) Toilet Transfer Details (indicate cue type and reason): demo'd good hand placement and transfer technique         Functional mobility during ADLs: Supervision/safety;Rolling walker (2 wheels)      Extremity/Trunk Assessment              Vision       Perception     Praxis     Communication Communication Communication: No apparent difficulties   Cognition Arousal: Alert Behavior During Therapy: WFL for tasks assessed/performed Cognition: No apparent impairments                               Following commands: Intact        Cueing   Cueing Techniques: Verbal cues, Gestural cues  Exercises      Shoulder Instructions       General Comments VSS    Pertinent Vitals/ Pain  Pain Assessment Pain Assessment: Faces Faces Pain Scale: Hurts even more Pain Location: abdomen Pain Descriptors / Indicators: Discomfort Pain Intervention(s): Limited activity within patient's tolerance, Monitored during session, Repositioned  Home Living                                          Prior Functioning/Environment               Frequency           Progress Toward Goals  OT Goals(current goals can now be found in the care plan section)  Progress towards OT goals: Goals met/education completed, patient discharged from OT     Plan      Co-evaluation                 AM-PAC OT 6 Clicks Daily Activity     Outcome Measure   Help from another person eating meals?: None Help from another person taking care of personal grooming?: None Help from another person toileting, which includes using toliet, bedpan, or urinal?: A Little Help from another person bathing (including washing, rinsing, drying)?: A Little Help from another person to put on and taking off regular upper body clothing?: None Help from another person to put on and taking off regular lower body clothing?: None 6 Click Score: 22    End of Session Equipment Utilized During Treatment: Rolling walker (2 wheels)  OT Visit Diagnosis: Other abnormalities of gait and mobility (R26.89);Pain   Activity Tolerance Patient tolerated treatment well   Patient Left in bed;with call bell/phone within reach   Nurse Communication Mobility status        Time: 8889-8870 OT Time Calculation (min): 19 min  Charges: OT General Charges $OT Visit: 1 Visit OT Treatments $Self Care/Home Management : 8-22 mins  Elza Varricchio M. [place], OTR/L [facility] Services [phone] Secure Chat Preferred  Rikki Burma 08/09/2024, 2:47 PM

## 2024-08-09 NOTE — TOC Progression Note (Signed)
 Transition of Care (TOC) - Progression Note  Rayfield Gobble RN,BSN Inpatient Care Management Unit 4NP (Non Trauma)- RN Case Manager See Treatment Team for direct Phone #   Patient Details  Name: Andrew Douglas MRN: [id number] Date of Birth: [birth year]  Transition of Care [facility]) CM/SW Contact  Gobble Rayfield Hurst, RN Phone Number: 08/09/2024, 2:21 PM  Clinical Narrative:    Follow up done with pt at bedside for [facility] and DME needs- pt called his wife on the phone and wife voiced she is agreeable to [facility]- explained referral made in Hub for choice and [facility] is able to service- wife voiced understanding and is agreeable to [facility] services with [facility].  Reviewed DME needs as well- wife states she thinks they may have RW in storage and will check- contact info left with pt for wife to call later when she arrives to hospital.   Update- 1330- wife called this CM to follow up on DME needs- per wife she could not find RW at home- and states pt will need one for discharge. Wife also asked about a BSC/3n1- note OT has recommended DME. Choice offered for DME provider- which wife states she does not have a preference and agreeable to one delivering DME prior to discharge   Wife also asked about in-home care for while she is at work. Explained those services are arranged by family and out of pocket cost. Wife voiced she thinks she has a benefit through work and will contact them to follow up.   Referral for DME needs- RW/BSC placed in Hub to [facility] and msg sent to liaison,    Expected Discharge Plan: Home/Self Care Barriers to Discharge: Continued Medical Work up               Expected Discharge Plan and Services   Discharge Planning Services: CM Consult Post Acute Care Choice: Durable Medical Equipment, Home Health Living arrangements for the past 2 months: Single Family Home                 DME Arranged: Bedside commode, Walker rolling DME Agency: [facility] Date DME Agency  Contacted: 08/09/24 Time DME Agency Contacted: 1420 Representative spoke with at DME Agency: [facility] HH Arranged: PT, OT HH Agency: [facility] ([facility] Date HH Agency Contacted: 08/06/24   Representative spoke with at [facility] Agency: [facility]   Social Drivers of Health (SDOH) Interventions SDOH Screenings   Food Insecurity: No Food Insecurity (08/05/2024)  Housing: Low Risk (08/08/2024)  Transportation Needs: No Transportation Needs (08/05/2024)  Utilities: Not At Risk (08/05/2024)  Depression (PHQ2-9): Low Risk (04/20/2024)  Financial Resource Strain: Low Risk (04/19/2022)   Received from [facility]  Social Connections: Moderately Isolated (08/05/2024)  Tobacco Use: Medium Risk (07/27/2024)    Readmission Risk Interventions    05/22/2024    9:34 AM 10/03/2023   10:35 AM  Readmission Risk Prevention Plan  Post Dischage Appt  Complete  Medication Screening  Complete  Transportation Screening Complete Complete  PCP or Specialist Appt within 3-5 Days Complete   HRI or Home Care Consult Complete   Social Work Consult for Recovery Care Planning/Counseling Complete   Palliative Care Screening Not Applicable   Medication Review Oceanographer) Referral to Pharmacy

## 2024-08-09 NOTE — Plan of Care (Signed)
   Problem: Education: Goal: Knowledge of General Education information will improve Description Including pain rating scale, medication(s)/side effects and non-pharmacologic comfort measures Outcome: Progressing   Problem: Health Behavior/Discharge Planning: Goal: Ability to manage health-related needs will improve Outcome: Progressing

## 2024-08-09 NOTE — Progress Notes (Signed)
 "   7 Days Post-Op  Subjective: CC: Doing well.  Still not needing pain meds. Eating well and taking ensure as supplement.     Objective: Vital signs in last 24 hours: Temp:  [98.2 F (36.8 C)-98.6 F (37 C)] 98.4 F (36.9 C) (01/29 0250) Pulse Rate:  [96] 96 (01/28 1923) Resp:  [18] 18 (01/28 1923) BP: (117-122)/(69-80) 122/80 (01/28 2300) SpO2:  [96 %] 96 % (01/28 1923) Last BM Date : 08/08/24  Intake/Output from previous day: 01/28 0701 - 01/29 0700 In: -  Out: 5 [Drains:5] Intake/Output this shift: No intake/output data recorded.  PE: Gen:  Alert, NAD, pleasant.  Pulm:  no distress. Abd: Soft, no distension, appropriately ttp around his incision, midline wound with staples in place. Remaining drain serosang Ext:  No LE edema  Psych: A&Ox3    Lab Results:  Recent Labs    08/08/24 0950 08/09/24 0443  WBC 10.9* 10.6*  HGB 9.7* 9.3*  HCT 29.4* 28.4*  PLT 278 304   BMET Recent Labs    08/07/24 0525 08/09/24 0443  NA 139 141  K 3.9 3.9  CL 105 106  CO2 24 27  GLUCOSE 122* 116*  BUN 14 17  CREATININE 0.78 0.85  CALCIUM 8.4* 8.6*   PT/INR No results for input(s): LABPROT, INR in the last 72 hours.  CMP     Component Value Date/Time   NA 141 08/09/2024 0443   K 3.9 08/09/2024 0443   CL 106 08/09/2024 0443   CO2 27 08/09/2024 0443   GLUCOSE 116 (H) 08/09/2024 0443   BUN 17 08/09/2024 0443   CREATININE 0.85 08/09/2024 0443   CREATININE 1.03 04/20/2024 1402   CALCIUM 8.6 (L) 08/09/2024 0443   PROT 6.3 (L) 08/07/2024 0525   ALBUMIN  3.2 (L) 08/07/2024 0525   AST 19 08/07/2024 0525   ALT 21 08/07/2024 0525   ALKPHOS 88 08/07/2024 0525   BILITOT 0.7 08/07/2024 0525   GFRNONAA >60 08/09/2024 0443   GFRNONAA >60 04/20/2024 1402   Lipase     Component Value Date/Time   LIPASE 18 05/16/2024 1645    Studies/Results: No results found.  Anti-infectives: Anti-infectives (From admission, onward)    Start     Dose/Rate Route Frequency  Ordered Stop   08/02/24 2200  ceFAZolin  (ANCEF ) IVPB 2g/100 mL premix        2 g 200 mL/hr over 30 Minutes Intravenous Every 8 hours 08/02/24 1728 08/02/24 2350   08/02/24 0745  ceFAZolin  (ANCEF ) IVPB 2g/100 mL premix        2 g 200 mL/hr over 30 Minutes Intravenous On call to O.R. 08/02/24 0731 08/02/24 0954        Assessment/Plan POD 7 s/p diagnostic laparoscopy, SBR, Classic pancreaticoduodenectomy, Placement of pancreatic duct stent by Dr. 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 soft pancreatic tissue. 12 mm common bile duct. 4 mm pancreatic duct. Several duodenal diverticula were seen and resected.   - Path pT2N1 cholangiocarcinoma, negative margins. Will refer back to oncology post op  - Cont JP x 1.  Pull other drain tomorrow hopefully.   - Mobilize, PT - Pulm toilet  FEN - Calorie count in progress  VTE - SCDs, SQH ID - Ancef  peri-op, none currently Foley - out, voiding spontaneously Plan - As above  Dispo:  hopefully home tomorrow.  HH PT/RN, but don't anticipate will need them for long.   HTN - PRN meds   LOS:  7 days   Jina LITTIE Nephew, MD, FACS, FSSO Surgical Oncology, General Surgery, Trauma and Critical [facility] Surgery, [place] [phone] for weekday/non holidays Check [email or web address] for coverage night/weekend/holidays under General Surgery     08/09/2024, 10:10 AM Please see Amion for pager number during day hours 7:00am-4:30pm   "

## 2024-08-10 LAB — CBC
HCT: 29.1 % — ABNORMAL LOW (ref 39.0–52.0)
Hemoglobin: 9.4 g/dL — ABNORMAL LOW (ref 13.0–17.0)
MCH: 28.5 pg (ref 26.0–34.0)
MCHC: 32.3 g/dL (ref 30.0–36.0)
MCV: 88.2 fL (ref 80.0–100.0)
Platelets: 317 10*3/uL (ref 150–400)
RBC: 3.3 MIL/uL — ABNORMAL LOW (ref 4.22–5.81)
RDW: 13.1 % (ref 11.5–15.5)
WBC: 11.6 10*3/uL — ABNORMAL HIGH (ref 4.0–10.5)
nRBC: 0 % (ref 0.0–0.2)

## 2024-08-10 MED ORDER — LIDOCAINE 5 % EX PTCH: 1.0000 | MEDICATED_PATCH | CUTANEOUS | 0 refills | Status: AC

## 2024-08-10 MED ORDER — ENSURE PLUS HIGH PROTEIN PO LIQD: 237.0000 mL | Freq: Two times a day (BID) | ORAL | 1 refills | Status: AC

## 2024-08-10 MED ORDER — ACETAMINOPHEN 500 MG PO TABS: 1000.0000 mg | ORAL_TABLET | Freq: Four times a day (QID) | ORAL | 0 refills | Status: AC | PRN

## 2024-08-10 MED ORDER — HYDROCODONE-ACETAMINOPHEN 5-325 MG PO TABS: 1.0000 | ORAL_TABLET | Freq: Four times a day (QID) | ORAL | 0 refills | Status: AC | PRN

## 2024-08-10 NOTE — TOC Transition Note (Signed)
 Transition of Care (TOC) - Discharge Note Rayfield Gobble RN,BSN Inpatient Care Management Unit 4NP (Non Trauma)- RN Case Manager See Treatment Team for direct Phone #   Patient Details  Name: Andrew Douglas MRN: [id number] Date of Birth: 04-10-1946  Transition of Care [facility]) CM/SW Contact:  Gobble Rayfield Hurst, RN Phone Number: 08/10/2024, 10:37 AM   Clinical Narrative:    Pt stable for transition home today after JP removed.   DME set up w/ [employer] and has been delivered to pt- RW/BSC  HHPT/OT arranged w/ [facility]- liaison updated for start of care.   Wife at bedside to transport home  IP CM interventions have been completed no further needs noted.   Final next level of care: Home w Home Health Services Barriers to Discharge: Barriers Resolved   Patient Goals and CMS Choice Patient states their goals for this hospitalization and ongoing recovery are:: wants to return home and recover   Choice offered to / list presented to : Patient      Discharge Placement                 Home w/ [facility]      Discharge Plan and Services Additional resources added to the After Visit Summary for     Discharge Planning Services: CM Consult Post Acute Care Choice: Durable Medical Equipment, Home Health          DME Arranged: Bedside commode, Walker rolling DME Agency: [facility] Date DME Agency Contacted: 08/09/24 Time DME Agency Contacted: 1420 Representative spoke with at DME Agency: [facility] HH Arranged: PT, OT HH Agency: [facility] Date HH Agency Contacted: 08/06/24   Representative spoke with at [facility] Agency: [facility]  Social Drivers of Health (SDOH) Interventions SDOH Screenings   Food Insecurity: No Food Insecurity (08/05/2024)  Housing: Low Risk (08/08/2024)  Transportation Needs: No Transportation Needs (08/05/2024)  Utilities: Not At Risk (08/05/2024)  Depression (PHQ2-9): Low Risk (04/20/2024)  Financial Resource Strain: Low Risk  (04/19/2022)   Received from [facility]  Social Connections: Moderately Isolated (08/05/2024)  Tobacco Use: Medium Risk (07/27/2024)     Readmission Risk Interventions    08/10/2024   10:36 AM 05/22/2024    9:34 AM 10/03/2023   10:35 AM  Readmission Risk Prevention Plan  Post Dischage Appt   Complete  Medication Screening   Complete  Transportation Screening  Complete Complete  PCP or Specialist Appt within 3-5 Days Complete Complete   HRI or Home Care Consult Complete Complete   Social Work Consult for Recovery Care Planning/Counseling Complete Complete   Palliative Care Screening Not Applicable Not Applicable   Medication Review Oceanographer) Complete Referral to Pharmacy

## 2024-08-10 NOTE — Progress Notes (Signed)
 Patient discharged, Important Message Letter mailed to patient.

## 2024-08-10 NOTE — Discharge Summary (Signed)
 Physician Discharge Summary  Patient ID: Andrew Douglas MRN: [id number] DOB/AGE: 1946/03/11 79 y.o.  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    Discharged Condition: stable  Hospital Course:  Patient was admitted to the ICU following diagnostic laparoscopy and Whipple 08/02/2024.  He did well overnight with minimal pain.  Epidural was controlling his pain well.  Arterial line was removed on postop day 1.  On postop day 2 his Foley catheter was removed with good results.  He was able to mobilize with physical therapy.  NG tube was removed on postop day 2 and he was started on clear liquids.  He was advanced to fulls and then to soft foods.  Epidural was removed.  He continued to have good pain control without requiring anything other than Tylenol  for his abdomen throughout the rest of his stay.  He had both of his drains removed prior to discharge as their output came down to less than 5 to 10 cc/day of serosanguineous drainage.  He was passing gas and having bowel movements.  He was not having any nausea or vomiting.  LFTs came back down to normal.  He did develop some left elbow pain.  He had no evidence of joint swelling or cellulitis on the skin.  This responded  to lidocaine  topically.  He had no falls in the hospital or previous.  He had calorie counts and was having increasing caloric intake with 76% of food and then adding in some Ensure.  He was discharged in stable condition.  He had no fevers and no evidence of wound infection.  Consults: None  Significant Diagnostic Studies: labs: WBCs 11.6k, HCT 29.1, bmet normal other than gluc 116.   Treatments: surgery: see above  Discharge Exam: Blood pressure 126/78, pulse 86, temperature 98.3 F (36.8 C), temperature source Oral, resp. rate 17, height 6' (1.829 m), weight 81.6 kg, SpO2 96%. General appearance: alert, cooperative, and no distress Resp: breathing comfortably GI: abd soft, non distended, non tender. Midline wound with staples intact.  No drainage or erythema.  Remaining drain to be pulled, serosang.  Extremities: extremities normal, atraumatic, no cyanosis or edema and left elbow/wrist without swelling or cellulitis or effusion.    Disposition: Discharge disposition: 01-Home or Self Care       Discharge Instructions     Call MD for:  difficulty breathing, headache or visual disturbances   Complete by: As directed    Call MD for:  hives   Complete by: As directed    Call MD for:  persistant nausea and vomiting   Complete by: As directed    Call MD for:  redness, tenderness, or signs of infection (pain, swelling, redness, odor or green/yellow discharge around incision site)   Complete by: As directed    Call MD for:  severe uncontrolled pain  Complete by: As directed    Call MD for:  temperature >100.4   Complete by: As directed    Change dressing (specify)   Complete by: As directed    Dry dressing daily to drain sites and as needed. OK to stop once drainage ends.   Diet - low sodium heart healthy   Complete by: As directed    Increase activity slowly   Complete by: As directed       Allergies as of 08/10/2024       Reactions   Oxycontin  [oxycodone ]    Crazy  dreams        Medication List     TAKE these medications    acetaminophen  500 MG tablet Commonly known as: TYLENOL  Take 2 tablets (1,000 mg total) by mouth every 6 (six) hours as needed for mild pain (pain score 1-3).   ascorbic acid 500 MG tablet Commonly known as: VITAMIN C Take 500 mg by mouth daily.   aspirin EC 81 MG tablet Take 81 mg by mouth daily. Swallow whole.   bimatoprost 0.03 % ophthalmic solution Commonly known as: LUMIGAN Place 1 drop into both eyes at bedtime.   feeding supplement Liqd Take 237 mLs by mouth 2 (two) times daily between meals.   HYDROcodone -acetaminophen  5-325 MG tablet Commonly known as: NORCO/VICODIN Take 1 tablet by mouth every 6 (six) hours as needed for moderate pain (pain score 4-6) or severe pain (pain score 7-10).   lidocaine  5 % Commonly known as: LIDODERM  Place 1 patch onto the skin daily. Remove & Discard patch within 12 hours or as directed by MD   losartan-hydrochlorothiazide 100-12.5 MG tablet Commonly known as: HYZAAR Take 1 tablet by mouth daily.   multivitamin with minerals Tabs tablet Take 1 tablet by mouth daily.   omeprazole  40 MG capsule Commonly known as: PRILOSEC Take 40 mg by mouth daily as needed (acid reflux).   polyethylene glycol powder 17 GM/SCOOP powder Commonly known as: GLYCOLAX /MIRALAX  Take 17 g by mouth daily as needed for mild constipation or moderate constipation. Mix in 4-8 ounces of liquid as directed.   potassium chloride  10 MEQ tablet Commonly known as: KLOR-CON  Take 10 mEq by mouth 2 (two) times daily.               Durable Medical Equipment  (From admission, onward)           Start     Ordered   08/09/24 1334  For home use only DME Bedside commode  Once       Question:  Patient needs a bedside commode to treat with the following condition  Answer:  Weakness generalized   08/09/24 1334   08/07/24 1135  For home use only DME Walker rolling  Once       Question Answer  Comment  Walker: With 5 Inch Wheels   Patient needs a walker to treat with the following condition Weakness generalized      08/07/24 1135              Discharge Care Instructions  (From admission, onward)           Start     Ordered   08/10/24 0000  Change dressing (specify)       Comments: Dry dressing daily to drain sites and as needed. OK to stop once drainage ends.   08/10/24 0850            Contact information for follow-up providers  [facility] (DME) Follow up.   Specialty: DME Services Why: rolling walker and Bedside commode arranged- to be delivered to pt prior to discharge Contact information: [address] [place]  [address] [phone]        [name] Shoulders, MD Follow up in 2 week(s).   Specialty: General Surgery Contact information: [address] [place] [address] [place] [address] [phone]         [facility] [place] Follow up on 08/15/2024.   Specialty: General Surgery Why: 10 AM Contact information: [address] [place] [address] [phone]              Contact information for after-discharge care     Home Medical Care     [facility]) .   Service: Home Health Services Why: HHPT/OT arranged- they wil contact you to schedule Contact information: [address] [place]  [address] [phone]                     Signed: Shoulders [name] 08/10/2024, 8:50 AM

## 2024-08-10 NOTE — Progress Notes (Signed)
 Order to discharge patient home. Family at bedside. Discharge instructions/AVS given to and reviewed with patient. Education provided as needed. Patient verbalized understanding. 1 PIV and RIGHT sided abdominal bulb (JP) removed by this RN. Patient tolerated removal well, site dressed with petroleum, gauze, and paper tape. DME rolling walker and BSC delivered to patient prior to discharge. Personal belongings sent home with the patient. Home via private vehicle.

## 2024-08-10 NOTE — Care Management Important Message (Signed)
 Important Message  Patient Details  Name: Andrew Douglas MRN: [id number] Date of Birth: May 10, 1946   Important Message Given:  No     Jennie Laneta Dragon 08/10/2024, 1:19 PM

## 2024-08-10 NOTE — Plan of Care (Signed)

## 2024-08-11 LAB — TYPE AND SCREEN
ABO/RH(D): O POS
Antibody Screen: NEGATIVE
Unit division: 0
Unit division: 0
Unit division: 0
Unit division: 0

## 2024-08-11 LAB — BPAM RBC
Blood Product Expiration Date: 202602122359
Blood Product Expiration Date: 202602152359
Blood Product Expiration Date: 202602162359
Blood Product Expiration Date: 202602162359
ISSUE DATE / TIME: 202601221327
ISSUE DATE / TIME: 202601221327
Unit Type and Rh: 5100
Unit Type and Rh: 5100
Unit Type and Rh: 5100
Unit Type and Rh: 5100

## 2024-08-15 NOTE — Addendum Note (Signed)
 Addendum  created 08/15/24 1006 by Keneth Lynwood POUR, MD   Clinical Note Signed, Intraprocedure Blocks edited, SmartForm saved
